# Patient Record
Sex: Male | Born: 1955 | Race: White | Hispanic: No | Smoking: Current every day smoker
Health system: Southern US, Community
[De-identification: ages and names within clinical notes are randomized; demographics above are authoritative.]

## PROBLEM LIST (undated history)

## (undated) DIAGNOSIS — C801 Malignant (primary) neoplasm, unspecified: Secondary | ICD-10-CM

## (undated) DIAGNOSIS — I779 Disorder of arteries and arterioles, unspecified: Secondary | ICD-10-CM

## (undated) DIAGNOSIS — J449 Chronic obstructive pulmonary disease, unspecified: Secondary | ICD-10-CM

## (undated) DIAGNOSIS — C169 Malignant neoplasm of stomach, unspecified: Secondary | ICD-10-CM

## (undated) DIAGNOSIS — I639 Cerebral infarction, unspecified: Secondary | ICD-10-CM

## (undated) DIAGNOSIS — I739 Peripheral vascular disease, unspecified: Secondary | ICD-10-CM

## (undated) DIAGNOSIS — Z66 Do not resuscitate: Secondary | ICD-10-CM

## (undated) DIAGNOSIS — I10 Essential (primary) hypertension: Secondary | ICD-10-CM

## (undated) DIAGNOSIS — C3490 Malignant neoplasm of unspecified part of unspecified bronchus or lung: Secondary | ICD-10-CM

## (undated) HISTORY — DX: Do not resuscitate: Z66

## (undated) HISTORY — DX: Malignant neoplasm of unspecified part of unspecified bronchus or lung: C34.90

---

## 2001-01-25 ENCOUNTER — Inpatient Hospital Stay (HOSPITAL_COMMUNITY): Admission: EM | Admit: 2001-01-25 | Discharge: 2001-01-30 | Payer: Self-pay | Admitting: Emergency Medicine

## 2001-01-25 ENCOUNTER — Encounter: Payer: Self-pay | Admitting: Family Medicine

## 2010-03-09 ENCOUNTER — Encounter: Payer: Self-pay | Admitting: Internal Medicine

## 2010-07-03 NOTE — Consult Note (Signed)
[facility]  Patient:    Edward Duran, Edward Duran Visit Number: [id number] MRN: [id number]          Service Type: MED Location: ICCU IC06 01 Attending Physician:  Annamarie Dawley. Dictated by:   Beryle Beams, M.D. 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  RECOMMENDATIONS: 1. Fosphenytoin loading dose 20 mg/kg PE over one hour. 2. Continue with current fosphenytoin 100 mg every 8 hours.  We will check    a level in the a.m. to keep the dose in the high therapeutic range. 3. Follow-up EEG. 4. Patient is febrile, likely complication of aspiration.  However, if he    remains febrile we will consider a spinal tap.  HISTORY:  This is a 55 year old Caucasian gentleman who apparently sustained a stroke about two years ago.  It is not clear if this was an ischemic or hemorrhagic stroke.  He was seen at [facility], however.  Apparently he had been drinking prior to this stroke for about a year.  He apparently was at home today when he attempted to call his mother.  He was noted to have difficulty with expression of language and speech.  On arriving there the family noted he was having generalized twitches and stiffening.  He did come to, but then went on to have another one of these spells.  EMS was activated on arrival.  EMS noted that he was twitching on the right side, subsequently became unconscious.  He had another event again.  He was subsequently taken to the hospital where he was intubated, given Ativan a total of 6 mg.  Also was given thiamine and folic acid.  He is currently on diprivan drip and had to be intubated.  There have been no reported clinical seizures.  PAST MEDICAL HISTORY:  As stated above.  There is also a history of hypertension and alcoholism.  MEDICATIONS:  None.  SOCIAL HISTORY:  He is divorced.  Currently lives by  himself.  REVIEW OF SYSTEMS:  Unobtainable.  PHYSICAL EXAMINATION  GENERAL:  Thin gentleman who is intubated.  He is on a Propofol drip.  He does not open his eyes to deep pain.  NEUROLOGIC:  Pupils are equal and reactive.  He does have oculocephalic reflexes.  Corneal reflexes are absent bilaterally.  He flexes and withdraws modestly to deep painful stimuli bilaterally.  Reflexes are diminished throughout.  Toes are both upgoing.  LABORATORIES:  CT scan of the brain shows an old left occipital infarct almost in a watershed distribution.  There is also some encephalomalacia involving the left temporal region.  Sodium 134, potassium 3.7, chloride 103, CO2 19, glucose 261, BUN 6, creatinine 1.2, calcium 8.6.  CK 55, MB 2.6, troponin 0.04.  Alcohol level 5, normal 0-10.  Acetaminophen is normal.  Urinalysis shows greater than 3300 protein, ketones negative, leukocyte esterase negative, wbcs 7-10, rbcs 11-20, bacteria many.  Urine drug screen has been negative.  Salicylates: None detected.  WBC 12, hemoglobin 14, platelets 321,000.  It appears that another glucose was repeated and the level was 670.  INR 2.3, protime 18.8, PTT 43.  Alkaline phosphatase 144. Dictated by:   Beryle Beams, M.D. Attending Physician:  Annamarie Dawley DD:  01/25/01 TD:  01/26/01 Job: [id number]

## 2010-07-03 NOTE — Group Therapy Note (Signed)
[facility]  Patient:    MENASHE, KAFER Visit Number: [id number] MRN: [id number]          Service Type: MED Location: ICCU IC06 01 Attending Physician:  Annamarie Dawley. Dictated by:   Kari Baars, M.D. Admit Date:  01/25/2001                               Progress Note  PROBLEM:  Respiratory failure.  SUBJECTIVE:  Mr. Brutus is much more alert this morning.  He is awake.  Seems to be fighting to try to get the endotracheal tube out.  His arterial blood gases showed that his pH was about 7.4, PCO2 in the 30s, PO2 in the 90s.  His chest is very clear and he is more alert.  ASSESSMENT:  He does seem to be improving, albeit it is fairly slowly.  PLAN:  Extubate him today and see if we can get him back on regular regimen of treatment.  It is not clear how much of his problem is going to be a permanent effect. Dictated by:   Kari Baars, M.D. Attending Physician:  Annamarie Dawley DD:  01/27/01 TD:  01/27/01 Job: [id number]

## 2010-07-03 NOTE — Group Therapy Note (Signed)
[facility]  Patient:    Edward Duran, Edward Duran Visit Number: [id number] MRN: [id number]          Service Type: MED Location: ICCU IC06 01 Attending Physician:  Annamarie Dawley. Dictated by:   Kari Baars, M.D. Admit Date:  01/25/2001                               Progress Note  SUBJECTIVE:  Edward Duran is much better this morning.  He is awake, alert, and things seem to be going okay.  He has no complaints.  He is confused about exactly what happened to him which is not surprising with the seizures.  He has been extubated.  He is tolerating that extremely well.  PHYSICAL EXAMINATION  CHEST:  Clear.  VITAL SIGNS:  His O2 saturations are running in the mid 90s.  ASSESSMENT:  He is much improved.  PLAN:  I am going to sign off.  I will be glad to see him again, of course, if needed.  Thanks again for allowing me to see him with you. Dictated by:   Kari Baars, M.D. Attending Physician:  Annamarie Dawley DD:  01/29/01 TD:  01/29/01 Job: [id number]

## 2010-07-03 NOTE — Discharge Summary (Signed)
[facility]  Patient:    Edward Duran, Edward Duran Visit Number: [id number] MRN: [id number]          Service Type: MED Location: 2A A215 01 Attending Physician:  Syliva Overman Dictated by:   Syliva Overman, M.D. Admit Date:  01/25/2001 Discharge Date: 01/30/2001                             Discharge Summary  DISCHARGE DIAGNOSES: 1. 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 2. Alcohol dependence with cirrhosis. 3. History of ascites. 4. History of hypertension. 5. 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 6. 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  HISTORY OF PRESENT ILLNESS:  The patient is a 55 year old male who was found unresponsive by his mother on the day of his admission.  The patient has a past history of alcohol use.  The mother stated she was uncertain when was the last time he had a drink.  The patient reports he had not had a drink in the past six months.  When his mother found him, she found him sitting in a chair unable to speak, otherwise, he did not seem to be in distress initially. However, as time progressed, he had progressive difficulty breathing, and 911 was called.  He was found to be in severe respiratory distress and unresponsive.  Intubation was attempted in the field unsuccessfully.  On arrival to the emergency room, the patient was immediately intubated.  At no time did he have any asystole.  While in the emergency department, the patient was noted to have a generalized seizure accompanied by urinary incontinence.  At that time, he was loaded with IV Cerebryx he also required fluid resuscitation.  The patient was also given Narcan and an ampule of D-50 in the emergency room in an attempt to assist in the recovery of his consciousness with no success.  PAST MEDICAL HISTORY: 1. Left middle cerebral artery stroke. 2. Chronic  alcohol use with history of cirrhosis. 3. History of hypertension.  PAST SURGICAL HISTORY:  Cystolithopexy.  ALLERGIES:  No known drug allergies.  SOCIAL HISTORY:  The patient is divorced.  He is alcohol dependent.  He smokes two packs of cigarettes a day.  He is a father of one son.  He is disabled.  ADMISSION PHYSICAL EXAMINATION:  GENERAL:  In the intensive care unit, he was sedated and intubated.  However, he would respond to noxious stimuli.  VITAL SIGNS:  Temperature was 101 and pulse 107.  He was on mechanical ventilation at a rate of 14 with a blood pressure of 145/89.  HEENT:  Negative for facial asymmetry.  Pupils are equal, round and reactive to light.  Oral exam revealed poor dentition.  His mucosa was moist and pink.  NECK:  He had no JVD.  He did have a bruit on the left side.  CARDIOVASCULAR:  Heart sounds one and two heard.  No murmur or S3 was heard.  ABDOMEN:  Soft and nontender.  No palpable organomegaly or masses.  Bowel sounds present and normal.  RECTAL:  Not done.  EXTREMITIES:  Negative for edema, ulcers or bruising.  Pulses are 4/2 in the left leg.  NEUROLOGIC:  Since the patient was sedated, the only observation was that the was responding to noxious stimulus.  DIAGNOSTIC STUDIES:  Sodium was 134, potassium 3.7, chloride 104, CO2  19, glucose 261, BUN 6, creatinine 1.2, calcium 8.6.  Initial CK/MB was 2.6. Creatinine kinase 55, troponin-I 0.04.  Alcohol level 5.  Tylenol 0.4. Urinalysis:  Clear with a specific gravity of 1.020, moderate amount of blood likely secondary to trauma, negative for leukocytes and nitrites. Drug screen was negative.  Salicylate level was sent for.  His white cell count was 21.4, hemoglobin 14.9, platelets 321,000.  Hepatic panel showed an elevated alkaline phosphatase at 144.  INR was 2.3.  His blood gases on 50% oxygen showed a pH of 7.412, CO2 52.6, PO2 of 210, with saturations of 99.7.  HOSPITAL COURSE: #1 -  PULMONARY SYSTEM:  The patient was maintained on mechanical ventilation until his neurologic state improved.  When it was felt safe to extubate him. He did not require much ventilatory support.  His management was by Dr. Juanetta Gosling.  He was extubated on January 27, 2001 successfully with no decompensation in his respiratory status.  On admission, he was placed on antibiotic coverage for prophylaxis against possible aspiration pneumonia.  His blood cultures after five days was negative.  His chest x-ray did not show any evidence of a pneumonia, and his discharge white cell count is normal at 8.  He will not be discharged on any antibiotics.  #2 - NEUROLOGIC SYSTEM:  Beryle Beams, M.D. of neurology was consulted on his admission.  He has had an EEG done the results of which are still pending.  At the time of his discharge, he is therapeutic with a Dilantin level of 23.9. He has had no seizures while in the hospital.  His neurologic exam on the day of discharge is negative for any focal signs.  A repeat CT scan done on admission showed no evidence of any new infarct.  He has grade 5 color with normal tone and reflexes in all extremities.  #3 - CARDIOVASCULAR SYSTEM:  The patient had a bilateral Doppler studies done which showed stenosis of his left internal carotid artery 95%.  He will be be discharged now with greater than 95% stenosis of the left ICA and PCA. He has nonstenotic atherosclerotic plaque of the right carotid bifurcation. Both vertebral arteries are patent.  He will be referred to vascular surgery for evaluation as an  outpatient, and he will be discharged home on an aspirin daily.  NOTE:  The patients GI and urologic systems were stable throughout.  DISCHARGE LABORATORY STUDIES:  Dilantin level 23.5.  Blood culture times five days negative.  Hemoglobin 12.6.  White cell count 8 and platelets 205. Chemistry showed a sodium of 144, potassium 3.5, chloride 105, CO2 31, BUN  2, creatinine 0.9, and glucose 140.   DISPOSITION:  The patient is discharged in stable condition.  He is being referred to case management for assistance with medication as an outpatient as well as eligibility for state aid for health care.  He will follow up with a primary physician of his choice which is Dr. Juanetta Gosling who he had seen up until one year ago.  DISCHARGE MEDICATIONS: 1. Dilantin 100 mg two at bedtime. 2. Aspirin 325 mg daily.  NOTE:  He has been advised to drink no alcohol. Dictated by:   Syliva Overman, M.D. Attending Physician:  Syliva Overman DD:  01/30/01 TD:  01/30/01 Job: [id number]

## 2010-07-03 NOTE — H&P (Signed)
[facility]  Patient:    Edward Duran, Edward Duran Visit Number: [id number] MRN: [id number]          Service Type: MED Location: ICCU IC06 01 Attending Physician:  Annamarie Dawley. Dictated by:   Syliva Overman, M.D. Admit Date:  01/25/2001                           History and Physical  CHIEF COMPLAINT:  In summary, Mr. Senner is a 55 year old white male, who was found unresponsive by his mother on the day of his admission.  HISTORY OF PRESENT ILLNESS:  The mother reported that the patient called for help and within five minutes when she went to the home she found him sitting on a chair unable to speak, and otherwise in no seeming distress.  The mother denied witnessing any seizure activity.  She called 911 and per EMS report when they arrived on the scene the patient was unresponsive and appeared to have labored breathing.  There was also report of seizure-like activity witnessed in the field.  Intubation was attempted in the field unsuccessfully, and on arrival to the emergency room a code was called and the patient was intubated.  While in the emergency department the patient had a generalized seizure as well as urinary incontinence.  At that time he received IV Cerebyx 100 mg loading dose and he required fluid resuscitation, receiving IV fluids ______ wide open.  He was also given Narcan 2 mg IV, an ampule of D50 IV on arrival with no recovery in the patients mentation.  PAST MEDICAL HISTORY:  Positive for CVA approximately two years ago.  The family reports he was hospitalized initially at [facility], then transferred to [facility] where he remained for approximately one months.  The cause of the seizure is not known.  Of significance is that when he first arrived in the emergency room his blood pressure was recorded as being 224/104 with a heart rate of 153.  PAST SURGICAL HISTORY:  None.  ALLERGIES:  None known.  SOCIAL HISTORY:   There is a positive history of alcohol abuse.  He smokes two packs of cigarettes daily.  The mother denies any illicit drug use.  The patient has been divorced for two years and has become increasingly alcohol dependent since that time.  He lives with another gentleman.  He has one grown son.  He is disabled.  PHYSICAL EXAMINATION:  GENERAL:  At the time when I saw him the patient was in the intensive care unit.  He was sedated and intubated.  He would respond to noxious stimuli.  VITAL SIGNS:  When I examined him in the intensive care unit temperature was 101 degrees, pulse 107.  He was being ventilated at a rate of 14.  Blood pressure 145/89.  HEENT:  There was no facial asymmetry.  Pupils reacted to light equally.  Oral examination revealed poor dentition.  Adequate moist, pink mucosa.  NECK:  No JVD.  No carotid bruits heard.  CHEST:  Adequate air entry throughout.  No crackles or wheezes heard.  CARDIOVASCULAR:  Heart sounds 1 and 2 heard.  No murmur, no S3.  ABDOMEN:  Soft, nontender.  No palpable organomegaly or masses.  Bowel sounds present and normal.  EXTREMITIES:  Negative for edema, ulcers, or bruising.  He did have a tattoo on his left leg.  RECTAL:  Not done.  NEUROLOGIC:  Not done secondary to the  patient being sedated to accommodate intubation.  However, he was seen to have spontaneous movement of the right lower extremity and he did respond to noxious stimuli.  LABORATORY DATA:  Admission sodium 134, potassium 3.7, chloride 104, CO2 19, glucose 261, BUN 6, creatinine 1.2.  Calcium 8.6.  Initial CK-MB was 2.6. Creatinine kinase 55.  Troponin I 0.04.  Alcohol level was 5.  Tylenol level 0.4.  Urinalysis was cloudy with specific gravity 1.020, moderate amount of blood likely secondary to trauma, protein 300; no leukocytes, nitrite negative.  Drug screen was negative.  Salicylate level less than 4.  WBC 21.4, hemoglobin 14.9, platelet count 321,000.  Hepatic  panel only showed an elevated alkaline phosphatase at 144.  INR was 2.3.  Blood gas on 50% oxygen showed pH 7.412, pCO2 32.6, pO2 210, saturation 99.7%.  ASSESSMENT:  This patient is a 55 year old white male with a history of cerebrovascular accident two years ago and a history of alcohol dependence, who presents with altered mental status, negative drug screen, and new seizure activity.  The patients head CT scan is reported as negative for any acute intracranial hemorrhage.  PLAN:  He has received a loading dose of Cerebyx 100 mg in the ER, and is being placed on 100 mg IV q.8h, with neurologic checks q.2h.  He will be maintained on mechanical ventilation for airway protection as long as is needed.  He is placed on double antibiotic coverage secondary to the possibility of an aspiration pneumonia and also in light of the fact that he has leukocytosis which may be well related only to his seizure activity. Neurology is consulted for assistance in his management and MRI of his brain is ordered with contrast to be done when the patient is stable.  An EEG is also requested.  Dr. Juanetta Gosling of pulmonary medicine is consulted for assistance with ventilator management.Dictated by:   Syliva Overman, M.D. Attending Physician:  Annamarie Dawley DD:  01/25/01 TD:  01/26/01 Job: [id number]

## 2010-07-03 NOTE — Group Therapy Note (Signed)
[facility]  Patient:    RASHAUD, YBARBO Visit Number: [id number] MRN: [id number]          Service Type: MED Location: ICCU IC06 01 Attending Physician:  Annamarie Dawley. Dictated by:   Kari Baars, M.D. Admit Date:  01/25/2001                               Progress Note  PROBLEM:  Respiratory failure secondary to status epilepticus, possible stroke.  SUBJECTIVE:  Mr. Mckillop is not responsive but he has been receiving diprivan. He has also received intravenous seizure medications.  PHYSICAL EXAMINATION  GENERAL:  He is unresponsive.  CHEST:  Actually relatively clear.  HEART:  Regular.  LABORATORIES:  Arterial blood gas shows his PO2 is well over 100 and he is hyperventilated.  ASSESSMENT:  He has hyperventilation which at times is helpful with cerebral edema but that is unlikely to be helpful in this case.  I will therefore reduce his ventilator.  He is not breathing at all on his own at this point. We will go ahead and reduce his rate and also reduce his oxygen slightly.  He is only on 40% so he has little chance of oxygen toxicity. Dictated by:   Kari Baars, M.D. Attending Physician:  Annamarie Dawley DD:  01/26/01 TD:  01/26/01 Job: [id number]

## 2012-12-28 ENCOUNTER — Ambulatory Visit (HOSPITAL_COMMUNITY)
Admission: RE | Admit: 2012-12-28 | Discharge: 2012-12-28 | Disposition: A | Discharge status: Home / Self Care | Payer: Self-pay | Source: Ambulatory Visit | Attending: Family Medicine | Admitting: Family Medicine

## 2012-12-28 ENCOUNTER — Other Ambulatory Visit (HOSPITAL_COMMUNITY): Payer: Self-pay | Admitting: Family Medicine

## 2012-12-28 DIAGNOSIS — M545 Low back pain, unspecified: Secondary | ICD-10-CM | POA: Insufficient documentation

## 2012-12-28 DIAGNOSIS — M47817 Spondylosis without myelopathy or radiculopathy, lumbosacral region: Secondary | ICD-10-CM | POA: Insufficient documentation

## 2012-12-28 DIAGNOSIS — I714 Abdominal aortic aneurysm, without rupture, unspecified: Secondary | ICD-10-CM | POA: Insufficient documentation

## 2012-12-28 DIAGNOSIS — M899 Disorder of bone, unspecified: Secondary | ICD-10-CM | POA: Insufficient documentation

## 2012-12-28 DIAGNOSIS — M4120 Other idiopathic scoliosis, site unspecified: Secondary | ICD-10-CM | POA: Insufficient documentation

## 2014-09-10 ENCOUNTER — Emergency Department (HOSPITAL_COMMUNITY): Payer: Self-pay

## 2014-09-10 ENCOUNTER — Emergency Department (HOSPITAL_COMMUNITY)
Admission: EM | Admit: 2014-09-10 | Discharge: 2014-09-10 | Disposition: A | Discharge status: Home / Self Care | Payer: Self-pay | Attending: Emergency Medicine | Admitting: Emergency Medicine

## 2014-09-10 ENCOUNTER — Encounter (HOSPITAL_COMMUNITY): Payer: Self-pay

## 2014-09-10 DIAGNOSIS — Z85828 Personal history of other malignant neoplasm of skin: Secondary | ICD-10-CM | POA: Insufficient documentation

## 2014-09-10 DIAGNOSIS — I714 Abdominal aortic aneurysm, without rupture, unspecified: Secondary | ICD-10-CM

## 2014-09-10 DIAGNOSIS — M5416 Radiculopathy, lumbar region: Secondary | ICD-10-CM | POA: Insufficient documentation

## 2014-09-10 DIAGNOSIS — M4186 Other forms of scoliosis, lumbar region: Secondary | ICD-10-CM | POA: Insufficient documentation

## 2014-09-10 DIAGNOSIS — Z8673 Personal history of transient ischemic attack (TIA), and cerebral infarction without residual deficits: Secondary | ICD-10-CM | POA: Insufficient documentation

## 2014-09-10 DIAGNOSIS — M419 Scoliosis, unspecified: Secondary | ICD-10-CM

## 2014-09-10 DIAGNOSIS — I10 Essential (primary) hypertension: Secondary | ICD-10-CM | POA: Insufficient documentation

## 2014-09-10 DIAGNOSIS — M25559 Pain in unspecified hip: Secondary | ICD-10-CM

## 2014-09-10 DIAGNOSIS — Z720 Tobacco use: Secondary | ICD-10-CM | POA: Insufficient documentation

## 2014-09-10 DIAGNOSIS — J449 Chronic obstructive pulmonary disease, unspecified: Secondary | ICD-10-CM | POA: Insufficient documentation

## 2014-09-10 HISTORY — DX: Peripheral vascular disease, unspecified: I73.9

## 2014-09-10 HISTORY — DX: Disorder of arteries and arterioles, unspecified: I77.9

## 2014-09-10 HISTORY — DX: Chronic obstructive pulmonary disease, unspecified: J44.9

## 2014-09-10 HISTORY — DX: Cerebral infarction, unspecified: I63.9

## 2014-09-10 HISTORY — DX: Malignant (primary) neoplasm, unspecified: C80.1

## 2014-09-10 HISTORY — DX: Essential (primary) hypertension: I10

## 2014-09-10 LAB — BASIC METABOLIC PANEL
ANION GAP: 9 (ref 5–15)
BUN: 11 mg/dL (ref 6–20)
CALCIUM: 8.8 mg/dL — AB (ref 8.9–10.3)
CO2: 25 mmol/L (ref 22–32)
CREATININE: 1.19 mg/dL (ref 0.61–1.24)
Chloride: 104 mmol/L (ref 101–111)
GFR calc Af Amer: >60 mL/min (ref >60)
GLUCOSE: 104 mg/dL — AB (ref 65–99)
Potassium: 3 mmol/L — ABNORMAL LOW (ref 3.5–5.1)
SODIUM: 138 mmol/L (ref 135–145)

## 2014-09-10 MED ORDER — HYDROCODONE-ACETAMINOPHEN 5-325 MG PO TABS: 1.0000 | ORAL_TABLET | ORAL | Status: DC | PRN

## 2014-09-10 MED ORDER — OXYCODONE-ACETAMINOPHEN 5-325 MG PO TABS
1.0000 | ORAL_TABLET | Freq: Once | ORAL | Status: AC
  Administered 2014-09-10: 1 via ORAL
  Filled 2014-09-10: qty 1

## 2014-09-10 MED ORDER — POTASSIUM CHLORIDE 20 MEQ PO PACK
40.0000 meq | PACK | Freq: Once | ORAL | Status: AC
  Administered 2014-09-10: 40 meq via ORAL
  Filled 2014-09-10: qty 2

## 2014-09-10 MED ORDER — LABETALOL HCL 5 MG/ML IV SOLN
10.0000 mg | Freq: Once | INTRAVENOUS | Status: AC
Start: 2014-09-10 — End: 2014-09-10
  Administered 2014-09-10: 10 mg via INTRAVENOUS
  Filled 2014-09-10: qty 4

## 2014-09-10 NOTE — ED Notes (Signed)
Awaiting xray disc.

## 2014-09-10 NOTE — ED Notes (Signed)
Pt reports fell stepping off of a step last weekend and c/o pain in r hip.  Reports has fallen 3 more times since then because of hip pain.  Pt ambulatory with cane at this time.

## 2014-09-10 NOTE — ED Provider Notes (Signed)
CSN: [id number]     Arrival date & time 09/10/14  1029 History   First MD Initiated Contact with Patient 09/10/14 1044     Chief Complaint  Patient presents with  . Hip Pain     (Consider location/radiation/quality/duration/timing/severity/associated sxs/prior Treatment) The history is provided by the patient and the spouse.   Edward Duran is a 59 y.o. 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  He describes falling on his right side when he tripped coming off an elevator at [employer] approximately one month ago.  Since then he has had 3 additional falls, most recently occurring approximately 3 days ago.  He describes pain in the right lateral hip which is aching and radiates into his lower posterior thigh and is worsened with movement, certain positions and weight bearing.  He has taken no medicines recently for pain relief, but did have hydrocodone for his recent surgical recovery which helped his hip pain greatly.  He and family endorses that since his recent referral to [facility], he has also been diagnosed with blockages in his carotid arteries and is anticipating consultation with a vascular surgeon there in 3 months. He has run out of his bp medication, took his last dose of his medication yesterday.  He will be picking up his new prescription from the health department today.    Past Medical History  Diagnosis Date  . COPD (chronic obstructive pulmonary disease)   . Hypertension   . Stroke   . Cancer   . Carotid artery disease    History reviewed. No pertinent past surgical history. No family history on file. History  Substance Use Topics  . Smoking status: Current Every Day Smoker  . Smokeless tobacco: Not on file  . Alcohol Use: No    Review of Systems   Constitutional: Negative for fever.  Eyes: Negative for visual disturbance.  Respiratory: Negative for shortness of breath.   Cardiovascular: Negative for chest pain and leg swelling.  Gastrointestinal: Negative for nausea, vomiting, abdominal pain, constipation and abdominal distention.  Genitourinary: Negative for dysuria, urgency, frequency, flank pain and difficulty urinating.  Musculoskeletal: Positive for back pain and arthralgias. Negative for joint swelling and gait problem.  Skin: Negative for rash.  Neurological: Negative for weakness, numbness and headaches.      Allergies  Review of patient's allergies indicates no known allergies.  Home Medications   Prior to Admission medications   Medication Sig Start Date End Date Taking? Authorizing Provider  HYDROcodone-acetaminophen (NORCO/VICODIN) 5-325 MG per tablet Take 1 tablet by mouth every 4 (four) hours as needed. 09/10/14   Evalee Jefferson, PA-C   BP 210/69 mmHg  Pulse 64  Temp(Src) 97.7 F (36.5 C) (Oral)  Resp 20  Ht '5\' 6"'$  (1.676 m)  Wt 130 lb (58.968 kg)  BMI 20.99 kg/m2  SpO2 95% Physical Exam  Constitutional: He appears well-developed and well-nourished.  HENT:  Head: Normocephalic.  Eyes: Conjunctivae are normal.  Neck: Normal range of motion. Neck supple.  Cardiovascular: Normal rate and intact distal pulses.   Pulses:      Dorsalis pedis pulses are 2+ on the right side, and 2+ on the left side.  Pedal pulses normal.  Pulmonary/Chest: Effort normal.  Abdominal: Soft. Bowel sounds are normal. He exhibits no distension and no mass.  Musculoskeletal: Normal range of motion. He exhibits no edema.       Lumbar back: He exhibits tenderness. He exhibits no bony tenderness, no swelling, no edema and no spasm.  ttp right posterior buttock and right lateral hip. No pain with flex/ext or internal and external ROM of the hip.    Neurological: He is alert. He has normal strength. He displays no atrophy and no tremor. No  sensory deficit. Gait normal.  Reflex Scores:      Patellar reflexes are 2+ on the right side and 2+ on the left side.      Achilles reflexes are 2+ on the right side and 2+ on the left side. No strength deficit noted in hip and knee flexor and extensor muscle groups.  Ankle flexion and extension intact.  Skin: Skin is warm and dry.  Psychiatric: He has a normal mood and affect.  Nursing note and vitals reviewed.   ED Course  Procedures (including critical care time) Labs Review Labs Reviewed  BASIC METABOLIC PANEL - Abnormal; Notable for the following:    Potassium 3.0 (*)    Glucose, Bld 104 (*)    Calcium 8.8 (*)    All other components within normal limits    Imaging Review Dg Chest 2 View  09/10/2014   CLINICAL DATA:  Six week history of productive cough.  EXAM: CHEST  2 VIEW  COMPARISON:  None.  FINDINGS: There is scarring with volume loss in the right middle lobe region. There is no edema or consolidation. The heart size and pulmonary vascularity are normal. No adenopathy. No bone lesions. There is atherosclerotic change in the aorta.  IMPRESSION: Scarring with volume loss right middle lobe. No edema or consolidation. As there are no prior examinations for comparison, a followup study in 6 to 8 weeks to assess for stability of the volume loss in the right middle lobe would be advised.   Electronically Signed   By: Lowella Grip III M.D.   On: 09/10/2014 11:22   Dg Lumbar Spine Complete  09/10/2014   CLINICAL DATA:  Lumbago with right-sided radicular symptoms  EXAM: LUMBAR SPINE - COMPLETE 4+ VIEW  COMPARISON:  August 27, 2012  FINDINGS: Frontal, lateral, spot lumbosacral lateral, and bilateral oblique views were obtained. There are 5 non-rib-bearing lumbar type vertebral bodies. T12 ribs are hypoplastic. There is increase in lumbar levorotoscoliosis. There is no fracture or spondylolisthesis. There is mild disc space narrowing at L1-2, L2-3, L3-4, and L4-5. There is facet  osteoarthritic change at L5-S1 bilaterally. There is atherosclerotic change in the aorta.  IMPRESSION: Levoscoliosis, increased from prior study. Disc space narrowing at multiple levels. No fracture or spondylolisthesis.  There is atherosclerotic change in the aorta. There is slight widening of the distal abdominal aorta, similar in appearance to the previous study. Given concern for potential mild distal aneurysm in the aorta, correlation with aortic ultrasound advised.   Electronically Signed   By: Lowella Grip III M.D.   On: 09/10/2014 11:25   Dg Hip Unilat With Pelvis 2-3 Views Right  09/10/2014   CLINICAL DATA:  Productive cough, right leg  EXAM: DG HIP (WITH OR WITHOUT PELVIS) 2-3V RIGHT  COMPARISON:  None.  FINDINGS: There is no evidence of hip fracture or dislocation. There is no evidence of arthropathy or other focal bone abnormality. There is peripheral vascular atherosclerotic disease.  IMPRESSION: No acute osseous injury of the right hip.   Electronically Signed   By: Kathreen Devoid  On: 09/10/2014 11:21     EKG Interpretation None      MDM   Final diagnoses:  Abdominal aortic aneurysm  Scoliosis  Lumbar radiculopathy  Essential hypertension    Patients labs and/or radiological studies were reviewed and considered during the medical decision making and disposition process.  Results were also discussed with patient.  Pt is ambulatory using cane.  Discussed findings with pt and ex wife at bedside. Advised of stable abdominal aneurysm which he states he knew nothing about, although previous LS film obtained in 2014 indicated this condition.  Per [facility] Chart he is scheduled with vascular surgery in October re his carotid artery disease.  He has been scheduled an aneurysm survey in anticipation of this appt.  Also discussed need for repeat chest xray in 8 weeks - advised have pcp follow this up.  He was given labetalol while here to lower bp, no sign of end organ damage today,  creatinine normal range, denies cp, denies headache, vision change.  Advised to get bp medication picked today (waiting for him at the health dept - cannot recall name).     Evalee Jefferson, PA-C 09/11/14 6010  Milton Ferguson, MD 09/11/14 0800

## 2014-09-10 NOTE — Discharge Instructions (Signed)
Lumbosacral Radiculopathy Lumbosacral radiculopathy is a pinched nerve or nerves in the low back (lumbosacral area). When this happens you may have weakness in your legs and may not be able to stand on your toes. You may have pain going down into your legs. There may be difficulties with walking normally. There are many causes of this problem. Sometimes this may happen from an injury, or simply from arthritis or boney problems. It may also be caused by other illnesses such as diabetes. If there is no improvement after treatment, further studies may be done to find the exact cause. DIAGNOSIS  X-rays may be needed if the problems become long standing. Electromyograms may be done. This study is one in which the working of nerves and muscles is studied. HOME CARE INSTRUCTIONS   Applications of ice packs may be helpful. Ice can be used in a plastic bag with a towel around it to prevent frostbite to skin. This may be used every 2 hours for 20 to 30 minutes, or as needed, while awake, or as directed by your caregiver.  Only take over-the-counter or prescription medicines for pain, discomfort, or fever as directed by your caregiver.  If physical therapy was prescribed, follow your caregiver's directions. SEEK IMMEDIATE MEDICAL CARE IF:   You have pain not controlled with medications.  You seem to be getting worse rather than better.  You develop increasing weakness in your legs.  You develop loss of bowel or bladder control.  You have difficulty with walking or balance, or develop clumsiness in the use of your legs.  You have a fever. MAKE SURE YOU:   Understand these instructions.  Will watch your condition.  Will get help right away if you are not doing well or get worse. Document Released: 02/01/2005 Document Revised: 04/26/2011 Document Reviewed: 09/22/2007 [facility] Patient Information 2015 [place], [place]. This information is not intended to replace advice given to you by your health  care provider. Make sure you discuss any questions you have with your health care provider.  Abdominal Aortic Aneurysm  Blood pumps away from the heart through tubes (blood vessels) called arteries. Aneurysms are weak or damaged places in the wall of an artery. It bulges out like a balloon. An abdominal aortic aneurysm happens in the main artery of the body (aorta). It can burst or tear, causing bleeding inside the body. This is an emergency. It needs treatment right away. CAUSES  The exact cause is unknown. Things that could cause this problem include:  Fat and other substances building up in the lining of a tube.  Swelling of the walls of a blood vessel.  Certain tissue diseases.  Belly (abdominal) trauma.  An infection in the main artery of the body. RISK FACTORS There are things that make it more likely for you to have an aneurysm. These include:  Being over the age of 60 years old.  Having high blood pressure (hypertension).  Being a male.  Being white.  Being very overweight (obese).  Having a family history of aneurysm.  Using tobacco products. PREVENTION To lessen your chance of getting this condition:  Stop smoking. Stop chewing tobacco.  Limit or avoid alcohol.  Keep your blood pressure, blood sugar, and cholesterol within normal limits.  Eat less salt.  Eat foods low in saturated fats and cholesterol. These are found in animal and whole dairy products.  Eat more fiber. Fiber is found in whole grains, vegetables, and fruits.  Keep a healthy weight.  Stay active and  exercise often. SYMPTOMS Symptoms depend on the size of the aneurysm and how fast it grows. There may not be symptoms. If symptoms occur, they can include:  Pain (belly, side, lower back, or groin).  Feeling full after eating a small amount of food.  Feeling sick to your stomach (nauseous), throwing up (vomiting), or both.  Feeling a lump in your belly that feels like it is beating  (pulsating).  Feeling like you will pass out (faint). TREATMENT   Medicine to control blood pressure and pain.  Imaging tests to see if the aneurysm gets bigger.  Surgery. MAKE SURE YOU:   Understand these instructions.  Will watch your condition.  Will get help right away if you are not doing well or get worse. Document Released: 05/29/2012 Document Reviewed: 05/29/2012 [facility] Patient Information 2015 [place]. This information is not intended to replace advice given to you by your health care provider. Make sure you discuss any questions you have with your health care provider.   Contact your primary doctor as discussed for assistance getting a referral to a closer vascular surgeon if you are having difficulty getting to your appointments at [facility].  Also as discussed you should have a repeat chest x-ray in 8 weeks to confirm that the findings on today's x-ray are stable scarring only.  You also need an ultrasound of your abdomen which can be done at your convenience, but as soon as is scheduleable by your doctor to get better information about your abdominal aneurysm.  Please have your doctor at the health department schedule this.  You may take the hydrocodone prescribed for pain relief.  This will make you drowsy - do not drive within 4 hours of taking this medication.  Get your blood pressure medicine picked up from the health department.  It is important for you to not miss any doses to help keep your blood pressure under better control.

## 2014-09-11 ENCOUNTER — Encounter (HOSPITAL_COMMUNITY): Payer: Self-pay | Admitting: Emergency Medicine

## 2014-09-11 DIAGNOSIS — I779 Disorder of arteries and arterioles, unspecified: Secondary | ICD-10-CM | POA: Insufficient documentation

## 2014-09-11 DIAGNOSIS — I739 Peripheral vascular disease, unspecified: Secondary | ICD-10-CM

## 2014-10-21 ENCOUNTER — Inpatient Hospital Stay (HOSPITAL_COMMUNITY)
Admission: EM | Admit: 2014-10-21 | Discharge: 2014-10-28 | DRG: 453 | Disposition: A | Discharge status: Rehabilitation Facility | Payer: Medicaid Other | Attending: Internal Medicine | Admitting: Internal Medicine

## 2014-10-21 ENCOUNTER — Encounter (HOSPITAL_COMMUNITY): Payer: Self-pay | Admitting: Emergency Medicine

## 2014-10-21 ENCOUNTER — Emergency Department (HOSPITAL_COMMUNITY): Payer: Medicaid Other

## 2014-10-21 ENCOUNTER — Inpatient Hospital Stay (HOSPITAL_COMMUNITY): Payer: Medicaid Other

## 2014-10-21 DIAGNOSIS — M545 Low back pain, unspecified: Secondary | ICD-10-CM

## 2014-10-21 DIAGNOSIS — R292 Abnormal reflex: Secondary | ICD-10-CM

## 2014-10-21 DIAGNOSIS — M8440XA Pathological fracture, unspecified site, initial encounter for fracture: Secondary | ICD-10-CM | POA: Diagnosis present

## 2014-10-21 DIAGNOSIS — E785 Hyperlipidemia, unspecified: Secondary | ICD-10-CM | POA: Diagnosis present

## 2014-10-21 DIAGNOSIS — E46 Unspecified protein-calorie malnutrition: Secondary | ICD-10-CM | POA: Diagnosis present

## 2014-10-21 DIAGNOSIS — R578 Other shock: Secondary | ICD-10-CM | POA: Diagnosis not present

## 2014-10-21 DIAGNOSIS — R739 Hyperglycemia, unspecified: Secondary | ICD-10-CM | POA: Diagnosis present

## 2014-10-21 DIAGNOSIS — Z7951 Long term (current) use of inhaled steroids: Secondary | ICD-10-CM | POA: Diagnosis not present

## 2014-10-21 DIAGNOSIS — R52 Pain, unspecified: Secondary | ICD-10-CM

## 2014-10-21 DIAGNOSIS — J9600 Acute respiratory failure, unspecified whether with hypoxia or hypercapnia: Secondary | ICD-10-CM | POA: Diagnosis not present

## 2014-10-21 DIAGNOSIS — I739 Peripheral vascular disease, unspecified: Secondary | ICD-10-CM | POA: Diagnosis present

## 2014-10-21 DIAGNOSIS — Z6820 Body mass index (BMI) 20.0-20.9, adult: Secondary | ICD-10-CM | POA: Diagnosis not present

## 2014-10-21 DIAGNOSIS — Z79899 Other long term (current) drug therapy: Secondary | ICD-10-CM

## 2014-10-21 DIAGNOSIS — J95821 Acute postprocedural respiratory failure: Secondary | ICD-10-CM

## 2014-10-21 DIAGNOSIS — R634 Abnormal weight loss: Secondary | ICD-10-CM | POA: Diagnosis present

## 2014-10-21 DIAGNOSIS — J810 Acute pulmonary edema: Secondary | ICD-10-CM | POA: Diagnosis not present

## 2014-10-21 DIAGNOSIS — M8458XA Pathological fracture in neoplastic disease, other specified site, initial encounter for fracture: Secondary | ICD-10-CM

## 2014-10-21 DIAGNOSIS — W19XXXA Unspecified fall, initial encounter: Secondary | ICD-10-CM

## 2014-10-21 DIAGNOSIS — C342 Malignant neoplasm of middle lobe, bronchus or lung: Secondary | ICD-10-CM | POA: Diagnosis present

## 2014-10-21 DIAGNOSIS — Z419 Encounter for procedure for purposes other than remedying health state, unspecified: Secondary | ICD-10-CM

## 2014-10-21 DIAGNOSIS — G9340 Encephalopathy, unspecified: Secondary | ICD-10-CM | POA: Diagnosis present

## 2014-10-21 DIAGNOSIS — C7951 Secondary malignant neoplasm of bone: Secondary | ICD-10-CM | POA: Diagnosis present

## 2014-10-21 DIAGNOSIS — I2510 Atherosclerotic heart disease of native coronary artery without angina pectoris: Secondary | ICD-10-CM | POA: Diagnosis present

## 2014-10-21 DIAGNOSIS — R339 Retention of urine, unspecified: Secondary | ICD-10-CM | POA: Diagnosis not present

## 2014-10-21 DIAGNOSIS — G834 Cauda equina syndrome: Secondary | ICD-10-CM | POA: Diagnosis not present

## 2014-10-21 DIAGNOSIS — N179 Acute kidney failure, unspecified: Secondary | ICD-10-CM | POA: Diagnosis present

## 2014-10-21 DIAGNOSIS — D62 Acute posthemorrhagic anemia: Secondary | ICD-10-CM | POA: Diagnosis not present

## 2014-10-21 DIAGNOSIS — E876 Hypokalemia: Secondary | ICD-10-CM | POA: Diagnosis not present

## 2014-10-21 DIAGNOSIS — Z8673 Personal history of transient ischemic attack (TIA), and cerebral infarction without residual deficits: Secondary | ICD-10-CM | POA: Diagnosis not present

## 2014-10-21 DIAGNOSIS — R579 Shock, unspecified: Secondary | ICD-10-CM

## 2014-10-21 DIAGNOSIS — F17210 Nicotine dependence, cigarettes, uncomplicated: Secondary | ICD-10-CM | POA: Diagnosis present

## 2014-10-21 DIAGNOSIS — M532X6 Spinal instabilities, lumbar region: Secondary | ICD-10-CM

## 2014-10-21 DIAGNOSIS — J449 Chronic obstructive pulmonary disease, unspecified: Secondary | ICD-10-CM

## 2014-10-21 DIAGNOSIS — C3490 Malignant neoplasm of unspecified part of unspecified bronchus or lung: Secondary | ICD-10-CM

## 2014-10-21 DIAGNOSIS — C799 Secondary malignant neoplasm of unspecified site: Secondary | ICD-10-CM

## 2014-10-21 DIAGNOSIS — Z85828 Personal history of other malignant neoplasm of skin: Secondary | ICD-10-CM

## 2014-10-21 DIAGNOSIS — R918 Other nonspecific abnormal finding of lung field: Secondary | ICD-10-CM

## 2014-10-21 DIAGNOSIS — R222 Localized swelling, mass and lump, trunk: Secondary | ICD-10-CM

## 2014-10-21 DIAGNOSIS — I10 Essential (primary) hypertension: Secondary | ICD-10-CM

## 2014-10-21 DIAGNOSIS — M8448XA Pathological fracture, other site, initial encounter for fracture: Secondary | ICD-10-CM

## 2014-10-21 DIAGNOSIS — R29898 Other symptoms and signs involving the musculoskeletal system: Secondary | ICD-10-CM

## 2014-10-21 LAB — BASIC METABOLIC PANEL
ANION GAP: 9 (ref 5–15)
BUN: 22 mg/dL — ABNORMAL HIGH (ref 6–20)
CALCIUM: 9.6 mg/dL (ref 8.9–10.3)
CHLORIDE: 100 mmol/L — AB (ref 101–111)
CO2: 27 mmol/L (ref 22–32)
Creatinine, Ser: 1.4 mg/dL — ABNORMAL HIGH (ref 0.61–1.24)
GFR calc non Af Amer: 54 mL/min — ABNORMAL LOW (ref >60)
Glucose, Bld: 110 mg/dL — ABNORMAL HIGH (ref 65–99)
Potassium: 4 mmol/L (ref 3.5–5.1)
SODIUM: 136 mmol/L (ref 135–145)

## 2014-10-21 LAB — CBC WITH DIFFERENTIAL/PLATELET
BASOS ABS: 0.1 10*3/uL (ref 0.0–0.1)
BASOS PCT: 0 % (ref 0–1)
EOS ABS: 0.6 10*3/uL (ref 0.0–0.7)
Eosinophils Relative: 5 % (ref 0–5)
HCT: 44.2 % (ref 39.0–52.0)
HEMOGLOBIN: 15.3 g/dL (ref 13.0–17.0)
Lymphocytes Relative: 15 % (ref 12–46)
Lymphs Abs: 1.6 10*3/uL (ref 0.7–4.0)
MCH: 32.4 pg (ref 26.0–34.0)
MCHC: 34.6 g/dL (ref 30.0–36.0)
MCV: 93.6 fL (ref 78.0–100.0)
MONOS PCT: 7 % (ref 3–12)
Monocytes Absolute: 0.8 10*3/uL (ref 0.1–1.0)
Neutro Abs: 8.1 10*3/uL — ABNORMAL HIGH (ref 1.7–7.7)
Neutrophils Relative %: 73 % (ref 43–77)
Platelets: 238 10*3/uL (ref 150–400)
RBC: 4.72 MIL/uL (ref 4.22–5.81)
RDW: 13.8 % (ref 11.5–15.5)
WBC: 11.2 10*3/uL — ABNORMAL HIGH (ref 4.0–10.5)

## 2014-10-21 LAB — HEPATIC FUNCTION PANEL
ALT: 20 U/L (ref 17–63)
AST: 22 U/L (ref 15–41)
Albumin: 3.5 g/dL (ref 3.5–5.0)
Alkaline Phosphatase: 132 U/L — ABNORMAL HIGH (ref 38–126)
Bilirubin, Direct: <0.1 mg/dL — ABNORMAL LOW (ref 0.1–0.5)
TOTAL PROTEIN: 6.6 g/dL (ref 6.5–8.1)
Total Bilirubin: 0.5 mg/dL (ref 0.3–1.2)

## 2014-10-21 LAB — URINALYSIS, ROUTINE W REFLEX MICROSCOPIC
BILIRUBIN URINE: NEGATIVE
Glucose, UA: NEGATIVE mg/dL
Hgb urine dipstick: NEGATIVE
KETONES UR: NEGATIVE mg/dL
Leukocytes, UA: NEGATIVE
Nitrite: NEGATIVE
PROTEIN: NEGATIVE mg/dL
Specific Gravity, Urine: 1.01 (ref 1.005–1.030)
UROBILINOGEN UA: 0.2 mg/dL (ref 0.0–1.0)
pH: 6 (ref 5.0–8.0)

## 2014-10-21 LAB — POC OCCULT BLOOD, ED: Fecal Occult Bld: NEGATIVE

## 2014-10-21 LAB — CALCIUM: CALCIUM: 9.3 mg/dL (ref 8.9–10.3)

## 2014-10-21 LAB — MAGNESIUM: Magnesium: 1.8 mg/dL (ref 1.7–2.4)

## 2014-10-21 LAB — SEDIMENTATION RATE: SED RATE: 31 mm/h — AB (ref 0–16)

## 2014-10-21 LAB — C-REACTIVE PROTEIN: CRP: 0.8 mg/dL (ref <1.0)

## 2014-10-21 MED ORDER — MORPHINE SULFATE (PF) 4 MG/ML IV SOLN
4.0000 mg | Freq: Once | INTRAVENOUS | Status: AC
  Administered 2014-10-21: 4 mg via INTRAVENOUS
  Filled 2014-10-21: qty 1

## 2014-10-21 MED ORDER — OLMESARTAN MEDOXOMIL-HCTZ 20-12.5 MG PO TABS
1.0000 | ORAL_TABLET | Freq: Every day | ORAL | Status: DC
Start: 2014-10-22 — End: 2014-10-21

## 2014-10-21 MED ORDER — GADOBENATE DIMEGLUMINE 529 MG/ML IV SOLN
10.0000 mL | Freq: Once | INTRAVENOUS | Status: AC | PRN
  Administered 2014-10-21: 10 mL via INTRAVENOUS

## 2014-10-21 MED ORDER — OXYCODONE HCL 5 MG PO TABS
5.0000 mg | ORAL_TABLET | ORAL | Status: DC | PRN
  Administered 2014-10-22 – 2014-10-23 (×3): 5 mg via ORAL
  Filled 2014-10-21 (×3): qty 1

## 2014-10-21 MED ORDER — ACETAMINOPHEN 650 MG RE SUPP: 650.0000 mg | Freq: Four times a day (QID) | RECTAL | Status: DC | PRN

## 2014-10-21 MED ORDER — IRBESARTAN 150 MG PO TABS
150.0000 mg | ORAL_TABLET | Freq: Every day | ORAL | Status: DC
  Administered 2014-10-22: 150 mg via ORAL
  Filled 2014-10-21: qty 1

## 2014-10-21 MED ORDER — BUDESONIDE-FORMOTEROL FUMARATE 160-4.5 MCG/ACT IN AERO
2.0000 | INHALATION_SPRAY | Freq: Two times a day (BID) | RESPIRATORY_TRACT | Status: DC
  Administered 2014-10-22 (×2): 2 via RESPIRATORY_TRACT
  Filled 2014-10-21: qty 6

## 2014-10-21 MED ORDER — IOHEXOL 300 MG/ML  SOLN
100.0000 mL | Freq: Once | INTRAMUSCULAR | Status: AC | PRN
  Administered 2014-10-21: 100 mL via INTRAVENOUS

## 2014-10-21 MED ORDER — SODIUM CHLORIDE 0.9 % IV SOLN
INTRAVENOUS | Status: DC
  Administered 2014-10-21: 23:00:00 via INTRAVENOUS
  Administered 2014-10-23: 100 mL/h via INTRAVENOUS
  Administered 2014-10-23 – 2014-10-25 (×2): via INTRAVENOUS
  Administered 2014-10-28: 1000 mL via INTRAVENOUS

## 2014-10-21 MED ORDER — VITAMIN B-1 100 MG PO TABS
100.0000 mg | ORAL_TABLET | Freq: Every day | ORAL | Status: DC
  Administered 2014-10-22 – 2014-10-28 (×5): 100 mg via ORAL
  Filled 2014-10-21 (×7): qty 1

## 2014-10-21 MED ORDER — ADULT MULTIVITAMIN W/MINERALS CH
1.0000 | ORAL_TABLET | Freq: Every day | ORAL | Status: DC
  Administered 2014-10-22 – 2014-10-28 (×5): 1 via ORAL
  Filled 2014-10-21 (×7): qty 1

## 2014-10-21 MED ORDER — ONDANSETRON HCL 4 MG/2ML IJ SOLN: 4.0000 mg | Freq: Four times a day (QID) | INTRAMUSCULAR | Status: DC | PRN

## 2014-10-21 MED ORDER — ONDANSETRON HCL 4 MG PO TABS: 4.0000 mg | ORAL_TABLET | Freq: Four times a day (QID) | ORAL | Status: DC | PRN

## 2014-10-21 MED ORDER — DOCUSATE SODIUM 100 MG PO CAPS
100.0000 mg | ORAL_CAPSULE | Freq: Two times a day (BID) | ORAL | Status: DC
  Administered 2014-10-22 – 2014-10-25 (×4): 100 mg via ORAL
  Filled 2014-10-21 (×7): qty 1

## 2014-10-21 MED ORDER — ONDANSETRON HCL 4 MG/2ML IJ SOLN
4.0000 mg | Freq: Once | INTRAMUSCULAR | Status: AC
  Administered 2014-10-21: 4 mg via INTRAVENOUS
  Filled 2014-10-21: qty 2

## 2014-10-21 MED ORDER — ALBUTEROL SULFATE (2.5 MG/3ML) 0.083% IN NEBU
2.5000 mg | INHALATION_SOLUTION | Freq: Four times a day (QID) | RESPIRATORY_TRACT | Status: DC
  Administered 2014-10-22: 2.5 mg via RESPIRATORY_TRACT
  Filled 2014-10-21: qty 3

## 2014-10-21 MED ORDER — IPRATROPIUM BROMIDE 0.02 % IN SOLN
0.5000 mg | Freq: Four times a day (QID) | RESPIRATORY_TRACT | Status: DC
  Administered 2014-10-22: 0.5 mg via RESPIRATORY_TRACT
  Filled 2014-10-21: qty 2.5

## 2014-10-21 MED ORDER — ACETAMINOPHEN 325 MG PO TABS
650.0000 mg | ORAL_TABLET | Freq: Four times a day (QID) | ORAL | Status: DC | PRN
  Administered 2014-10-22 (×2): 650 mg via ORAL
  Filled 2014-10-21 (×2): qty 2

## 2014-10-21 MED ORDER — EZETIMIBE-SIMVASTATIN 10-20 MG PO TABS
1.0000 | ORAL_TABLET | Freq: Every day | ORAL | Status: DC
  Administered 2014-10-22: 1 via ORAL
  Filled 2014-10-21 (×2): qty 1

## 2014-10-21 MED ORDER — SODIUM CHLORIDE 0.9 % IJ SOLN
3.0000 mL | Freq: Two times a day (BID) | INTRAMUSCULAR | Status: DC
  Administered 2014-10-24 – 2014-10-28 (×7): 3 mL via INTRAVENOUS

## 2014-10-21 MED ORDER — DEXAMETHASONE SODIUM PHOSPHATE 10 MG/ML IJ SOLN
10.0000 mg | Freq: Once | INTRAMUSCULAR | Status: AC
  Administered 2014-10-21: 10 mg via INTRAVENOUS
  Filled 2014-10-21: qty 1

## 2014-10-21 MED ORDER — DIAZEPAM 2 MG PO TABS
ORAL_TABLET | ORAL | Status: AC
  Filled 2014-10-21: qty 1

## 2014-10-21 MED ORDER — DIAZEPAM 2 MG PO TABS
2.0000 mg | ORAL_TABLET | Freq: Once | ORAL | Status: AC
  Administered 2014-10-21: 2 mg via ORAL
  Filled 2014-10-21: qty 1

## 2014-10-21 MED ORDER — MORPHINE SULFATE (PF) 4 MG/ML IV SOLN
4.0000 mg | INTRAVENOUS | Status: AC | PRN
  Administered 2014-10-21 – 2014-10-22 (×3): 4 mg via INTRAVENOUS
  Filled 2014-10-21 (×4): qty 1

## 2014-10-21 MED ORDER — IOHEXOL 300 MG/ML  SOLN
25.0000 mL | Freq: Once | INTRAMUSCULAR | Status: DC | PRN
Start: 2014-10-21 — End: 2014-10-28

## 2014-10-21 MED ORDER — ONDANSETRON HCL 4 MG/2ML IJ SOLN
4.0000 mg | Freq: Once | INTRAMUSCULAR | Status: AC
Start: 2014-10-21 — End: 2014-10-21
  Administered 2014-10-21: 4 mg via INTRAVENOUS
  Filled 2014-10-21: qty 2

## 2014-10-21 MED ORDER — HYDROCHLOROTHIAZIDE 12.5 MG PO CAPS
12.5000 mg | ORAL_CAPSULE | Freq: Every day | ORAL | Status: DC
  Administered 2014-10-22: 12.5 mg via ORAL
  Filled 2014-10-21: qty 1

## 2014-10-21 MED ORDER — POLYETHYLENE GLYCOL 3350 17 G PO PACK
17.0000 g | PACK | Freq: Every day | ORAL | Status: DC | PRN
  Administered 2014-10-25: 17 g via ORAL
  Filled 2014-10-21: qty 1

## 2014-10-21 MED ORDER — FOLIC ACID 1 MG PO TABS
1.0000 mg | ORAL_TABLET | Freq: Every day | ORAL | Status: DC
  Administered 2014-10-22 – 2014-10-28 (×5): 1 mg via ORAL
  Filled 2014-10-21 (×7): qty 1

## 2014-10-21 MED ORDER — METHOCARBAMOL 1000 MG/10ML IJ SOLN
1000.0000 mg | Freq: Once | INTRAVENOUS | Status: AC
  Administered 2014-10-21: 1000 mg via INTRAVENOUS
  Filled 2014-10-21: qty 10

## 2014-10-21 NOTE — ED Notes (Signed)
Pt back from CT, now to be transferred to 3S02.

## 2014-10-21 NOTE — ED Notes (Signed)
Pt fell on Saturday, wife noticed AMS on Saturday.  C/o lower back pain after fall.  Rates pain 9/10.

## 2014-10-21 NOTE — Consult Note (Signed)
CC: Right leg weakness Chief Complaint  Patient presents with  . Fall  . Altered Mental Status    HPI: Edward Duran is a 59 y.o. male who reports 1 month of progressive low back pain and right leg weakness. He denies bowel incontinence or urinary retention. He denies sensory changes in his lower extremities or around his perineum. He was evaluated today for his lower extremity weakness and found to have a pathological fracture at L3 with extension into the pedicles dorsally into the posterior elements. He denies pain in his legs.  He voided just prior to this interview.  PMH: Past Medical History  Diagnosis Date  . COPD (chronic obstructive pulmonary disease)   . Hypertension   . Stroke   . Cancer   . Carotid artery disease     PSH: History reviewed. No pertinent past surgical history.  SH: Social History  Substance Use Topics  . Smoking status: Current Every Day Smoker  . Smokeless tobacco: None  . Alcohol Use: No    MEDS: Prior to Admission medications   Medication Sig Start Date End Date Taking? Authorizing Provider  budesonide-formoterol (SYMBICORT) 160-4.5 MCG/ACT inhaler Inhale 2 puffs into the lungs 2 (two) times daily. 05/29/14  Yes Historical Provider, MD  ezetimibe-simvastatin (VYTORIN) 10-20 MG per tablet Take 1 tablet by mouth daily.   Yes Historical Provider, MD  olmesartan-hydrochlorothiazide (BENICAR HCT) 20-12.5 MG per tablet Take 1 tablet by mouth daily.   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 There is significant vertebral body height loss. The tumor extends into the pedicles especially on the right. It entirely effaces the thecal sac and compresses the cauda equina.  IMPRESSION: - 59 y.o. male with pathological fracture of L3 due to tumor involvement. 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 I have advised him that I recommend an L3 corpectomy with interbody cage as well as dorsal internal fixation and fusion from L1-L5 and decompression of his cauda equina. I explained the risks and benefits of this operation as well as the risks and benefits of the alternatives. He would like to have the night to think about it I think this is wise. This will also allow [place] greater time to understand the metastatic disease burden and make an estimated at his potential prognosis. I think as long as his prognosis is 3 months or greater we should seriously consider proceeding. However, if his prognosis is less than 3 months there is little benefit to proceeding with surgery.  PLAN: - Please perform post void residual and leave foley in place for residual > 250cc. - Lumbar spine CT reformats - Possibly L3 corpectomy, L1-5 dorsal internal fixation and fusion, decompression of L3. - NPO after midnight
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
## 2014-10-21 NOTE — H&P (Signed)
[facility] History and Physical  Edward Duran NAT:[id number] DOB: [birth year] DOA: 10/21/2014  Referring physician: Deno Etienne, DO PCP: No PCP Per Patient   Chief Complaint: Hilar Mass  HPI: Edward Duran is a 59 y.o. male with history of COPD HTN HLD presents with evaluation of weakness of the left leg with hyper-reflexia. Patient states that he has been having pain in the left leg for a couple of months. Associated with the pain is lower back pain. He went to the ED in [facility] and at that time a diagnosis of arthritis was made and was given pain medications. Patient also does have a history of skin cancer. He states that his pain was not getting better. He went back to the ED again because of the back pain and was sent down from [facility] to get an MRI here. In the ED here he was found to have a pathological fracture of the L3 vertebra. Patient also was noted to have a mass in the hilar area of the right lung along with a mass in the RLL. He is a smoker for at least 40 years. Patient states that he has had weight loss. He has had poor appetite. 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 Patient today also started to notice that the weakness in the left leg has gotten worse to the point that he was falling at home frequently. He has no LOC and no head injury. He states that the knee seems to give out.   Review of Systems:  Constitutional:  +weight loss, no night sweats, Fevers HEENT:  No headaches, sneezing, itching, ear ache, nasal congestion, post nasal drip,  Cardio-vascular:  No chest pain, swelling in lower extremities, palpitations  GI:  No heartburn, indigestion, abdominal pain, nausea, vomiting, diarrhea  Resp:  +shortness of breath with exertion. No coughing up of blood ( he has in the past had blood in his sputum).No change in color of mucus. Skin:  Surgery for skin cancer left side of face GU:  no dysuria, change in color of urine Musculoskeletal:  No joint pain or  swelling Psych:  No change in mood or affect  Past Medical History  Diagnosis Date  . COPD (chronic obstructive pulmonary disease)   . Hypertension   . Stroke   . Cancer   . Carotid artery disease    History reviewed. No pertinent past surgical history. Social History:  reports that he has been smoking.  He does not have any smokeless tobacco history on file. He reports that he uses illicit drugs. He reports that he does not drink alcohol.  No Known Allergies  History reviewed. No pertinent family history.   Prior to Admission medications   Medication Sig Start Date End Date Taking? Authorizing Provider  budesonide-formoterol (SYMBICORT) 160-4.5 MCG/ACT inhaler Inhale 2 puffs into the lungs 2 (two) times daily. 05/29/14  Yes Historical Provider, MD  ezetimibe-simvastatin (VYTORIN) 10-20 MG per tablet Take 1 tablet by mouth daily.   Yes Historical Provider, MD  olmesartan-hydrochlorothiazide (BENICAR HCT) 20-12.5 MG per tablet Take 1 tablet by mouth daily.   Yes Historical Provider, MD   Physical Exam: Filed Vitals:   10/21/14 1515 10/21/14 1600 10/21/14 1615 10/21/14 1855  BP: 96/65 114/71 114/71 132/68  Pulse: 94 88 88 91  Temp:      TempSrc:      Resp:    17  Height:      Weight:      SpO2:  93%  92% 93%    Wt Readings from Last 3 Encounters:  10/21/14 58.968 kg (130 lb)  09/10/14 58.968 kg (130 lb)    General:  Appears calm and comfortable Eyes: PERRL, normal lids, irises & conjunctiva ENT: grossly normal hearing, lips & tongue Neck: no LAD, masses or thyromegaly Cardiovascular: RRR, no m/r/g. No LE edema Respiratory: CTA bilaterally, no w/r/r Abdomen: soft, ntnd Skin: old scar from skin surgery Musculoskeletal: +increased reflexes on left side Psychiatric: grossly normal mood and affect Neurologic: 4/5 left sided weakness with increased reflexes sensory intact          Labs on Admission:  Basic Metabolic Panel:  Recent Labs Lab 10/21/14 1130  NA 136    K 4.0  CL 100*  CO2 27  GLUCOSE 110*  BUN 22*  CREATININE 1.40*  CALCIUM 9.6   Liver Function Tests: No results for input(s): AST, ALT, ALKPHOS, BILITOT, PROT, ALBUMIN in the last 168 hours. No results for input(s): LIPASE, AMYLASE in the last 168 hours. No results for input(s): AMMONIA in the last 168 hours. CBC:  Recent Labs Lab 10/21/14 1130  WBC 11.2*  NEUTROABS 8.1*  HGB 15.3  HCT 44.2  MCV 93.6  PLT 238   Cardiac Enzymes: No results for input(s): CKTOTAL, CKMB, CKMBINDEX, TROPONINI in the last 168 hours.  BNP (last 3 results) No results for input(s): BNP in the last 8760 hours.  ProBNP (last 3 results) No results for input(s): PROBNP in the last 8760 hours.  CBG: No results for input(s): GLUCAP in the last 168 hours.  Radiological Exams on Admission: Mr Thoracic Spine W Wo Contrast  10/21/2014   CLINICAL DATA:  Back pain.  Recent falls.  EXAM: MRI THORACIC AND LUMBAR SPINE WITHOUT AND WITH CONTRAST  TECHNIQUE: Multiplanar and multiecho pulse sequences of the thoracic and lumbar spine were obtained without and with intravenous contrast.  CONTRAST:  46m MULTIHANCE GADOBENATE DIMEGLUMINE 529 MG/ML IV SOLN  COMPARISON:  Lumbar radiographs 09/10/2014. No prior MRI for comparison.  FINDINGS: MR THORACIC SPINE FINDINGS  Negative for thoracic spine fracture. 1 cm lesion in the T10 vertebral body is low signal on T1 and T2 but does not enhance. Given the findings lumbar spine, this may represent metastatic disease however could also be a benign lesion such as hemangioma. No other worrisome lesions in the thoracic spine.  No cord compression.  Spinal cord signal is normal.  No significant disc degeneration or disc protrusion  No enhancing lesions are seen postcontrast.  Right lower lobe lesion posteriorly measuring approximately 2 x 2.5 cm. There is right hilar adenopathy. Findings are worrisome for carcinoma of the lung. Chest CT recommended.  MR LUMBAR SPINE FINDINGS  Moderate  levoscoliosis.  Distended urinary bladder.  Pathologic fracture of L2. There is extensive tumor throughout the L3 vertebral body extending to the right transverse process and posterior elements on the right. There is extensive tumor within the canal and right foramen at L3-4. Severe spinal stenosis with compression of the thecal sac due to tumor. This is likely the cause of the distended urinary bladder.  Conus medullaris normal. No other fracture or metastatic disease identified in the lumbar spine.  Moderate spinal stenosis L4-5 due to disc and facet degeneration.  IMPRESSION: Findings worrisome for lung cancer with right lower lobe density and right hilar adenopathy. CT chest with contrast recommended for further evaluation.  1 cm nonenhancing lesion T10 vertebral body, worrisome for metastatic disease. No thoracic cord compression or fracture.  Pathologic fracture  of L3. Tumor extends into the posterior elements and transverse process on the right. There is extensive tumor in the canal causing severe spinal stenosis. Distended urinary bladder likely due to severe spinal stenosis and cauda equina syndrome.  Moderate spinal stenosis L4-5 due to degenerative change.  Critical Value/emergent results were called by telephone at the time of interpretation on 10/21/2014 at 6:54 pm to Dr. Tanna Furry , who verbally acknowledged these results.   Electronically Signed   By: Franchot Gallo M.D.   On: 10/21/2014 18:56   Mr Lumbar Spine W Wo Contrast  10/21/2014   CLINICAL DATA:  Back pain.  Recent falls.  EXAM: MRI THORACIC AND LUMBAR SPINE WITHOUT AND WITH CONTRAST  TECHNIQUE: Multiplanar and multiecho pulse sequences of the thoracic and lumbar spine were obtained without and with intravenous contrast.  CONTRAST:  28m MULTIHANCE GADOBENATE DIMEGLUMINE 529 MG/ML IV SOLN  COMPARISON:  Lumbar radiographs 09/10/2014. No prior MRI for comparison.  FINDINGS: MR THORACIC SPINE FINDINGS  Negative for thoracic spine fracture. 1 cm  lesion in the T10 vertebral body is low signal on T1 and T2 but does not enhance. Given the findings lumbar spine, this may represent metastatic disease however could also be a benign lesion such as hemangioma. No other worrisome lesions in the thoracic spine.  No cord compression.  Spinal cord signal is normal.  No significant disc degeneration or disc protrusion  No enhancing lesions are seen postcontrast.  Right lower lobe lesion posteriorly measuring approximately 2 x 2.5 cm. There is right hilar adenopathy. Findings are worrisome for carcinoma of the lung. Chest CT recommended.  MR LUMBAR SPINE FINDINGS  Moderate levoscoliosis.  Distended urinary bladder.  Pathologic fracture of L2. There is extensive tumor throughout the L3 vertebral body extending to the right transverse process and posterior elements on the right. There is extensive tumor within the canal and right foramen at L3-4. Severe spinal stenosis with compression of the thecal sac due to tumor. This is likely the cause of the distended urinary bladder.  Conus medullaris normal. No other fracture or metastatic disease identified in the lumbar spine.  Moderate spinal stenosis L4-5 due to disc and facet degeneration.  IMPRESSION: Findings worrisome for lung cancer with right lower lobe density and right hilar adenopathy. CT chest with contrast recommended for further evaluation.  1 cm nonenhancing lesion T10 vertebral body, worrisome for metastatic disease. No thoracic cord compression or fracture.  Pathologic fracture of L3. Tumor extends into the posterior elements and transverse process on the right. There is extensive tumor in the canal causing severe spinal stenosis. Distended urinary bladder likely due to severe spinal stenosis and cauda equina syndrome.  Moderate spinal stenosis L4-5 due to degenerative change.  Critical Value/emergent results were called by telephone at the time of interpretation on 10/21/2014 at 6:54 pm to Dr. MTanna Furry, who  verbally acknowledged these results.   Electronically Signed   By: CFranchot GalloM.D.   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 -spoke with  neuro surgery no steroids at this time -will check post void residual  2. Hilar Mass -as seen on MRI scan -will get CT scan of the chest tonight -will need pulmonary consult in am -oncology consult in am  3. HTN -will monitor pressures -continue with benicarHCT  4. COPD -on symbicort will continue  5. AKI -started on IVF -will monitor labs  6. Hyperlipidemia -patient is on vytorin will continue -check lipid profile  7. Hyperglycemia -will monitor FSBS -SSI as needed     Code Status: Full Code (must indicate code status--if unknown or must be presumed, indicate so) DVT Prophylaxis:SCD Family Communication: son (indicate person spoken with, if applicable, with phone number if by telephone) Disposition Plan: home (indicate anticipated LOS)    [facility] Pager [phone]

## 2014-10-21 NOTE — ED Notes (Signed)
This RN offered pt morphine but pt refused at this time.

## 2014-10-21 NOTE — Progress Notes (Signed)
Neb treatments not given, pt is resting and sleeping comfortably. O2 saturation are acceptable presenting at 94% on Room Air. Pt breath sounds are Clear at this time. No signs of respiratory distress no complications noted.
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
## 2014-10-21 NOTE — ED Notes (Signed)
Patient returned from MRI.

## 2014-10-21 NOTE — ED Notes (Signed)
Pt finished drinking oral CT contrast and CT made aware.

## 2014-10-21 NOTE — ED Notes (Signed)
Pt c/o mid/lower back pain from falls in past few days. Pt states his knees give out on him and he falls. Son states pt has been falling x one month now.

## 2014-10-21 NOTE — ED Notes (Signed)
Patient transported to MRI 

## 2014-10-22 DIAGNOSIS — C7951 Secondary malignant neoplasm of bone: Secondary | ICD-10-CM

## 2014-10-22 DIAGNOSIS — J449 Chronic obstructive pulmonary disease, unspecified: Secondary | ICD-10-CM

## 2014-10-22 DIAGNOSIS — E46 Unspecified protein-calorie malnutrition: Secondary | ICD-10-CM

## 2014-10-22 DIAGNOSIS — C801 Malignant (primary) neoplasm, unspecified: Secondary | ICD-10-CM

## 2014-10-22 DIAGNOSIS — R590 Localized enlarged lymph nodes: Secondary | ICD-10-CM

## 2014-10-22 LAB — COMPREHENSIVE METABOLIC PANEL
ALBUMIN: 3.4 g/dL — AB (ref 3.5–5.0)
ALK PHOS: 131 U/L — AB (ref 38–126)
ALT: 20 U/L (ref 17–63)
ANION GAP: 10 (ref 5–15)
AST: 22 U/L (ref 15–41)
BILIRUBIN TOTAL: 0.2 mg/dL — AB (ref 0.3–1.2)
BUN: 19 mg/dL (ref 6–20)
CALCIUM: 9.6 mg/dL (ref 8.9–10.3)
CO2: 24 mmol/L (ref 22–32)
Chloride: 101 mmol/L (ref 101–111)
Creatinine, Ser: 1.52 mg/dL — ABNORMAL HIGH (ref 0.61–1.24)
GFR, EST AFRICAN AMERICAN: 56 mL/min — AB (ref >60)
GFR, EST NON AFRICAN AMERICAN: 48 mL/min — AB (ref >60)
Glucose, Bld: 139 mg/dL — ABNORMAL HIGH (ref 65–99)
POTASSIUM: 5.3 mmol/L — AB (ref 3.5–5.1)
Sodium: 135 mmol/L (ref 135–145)
TOTAL PROTEIN: 6.9 g/dL (ref 6.5–8.1)

## 2014-10-22 LAB — CBC
HCT: 40.9 % (ref 39.0–52.0)
Hemoglobin: 14.5 g/dL (ref 13.0–17.0)
MCH: 33 pg (ref 26.0–34.0)
MCHC: 35.5 g/dL (ref 30.0–36.0)
MCV: 93.2 fL (ref 78.0–100.0)
PLATELETS: 208 10*3/uL (ref 150–400)
RBC: 4.39 MIL/uL (ref 4.22–5.81)
RDW: 14 % (ref 11.5–15.5)
WBC: 5.5 10*3/uL (ref 4.0–10.5)

## 2014-10-22 LAB — PROTIME-INR
INR: 1.06 (ref 0.00–1.49)
Prothrombin Time: 14 seconds (ref 11.6–15.2)

## 2014-10-22 LAB — APTT: aPTT: 30 seconds (ref 24–37)

## 2014-10-22 LAB — GLUCOSE, CAPILLARY: GLUCOSE-CAPILLARY: 114 mg/dL — AB (ref 65–99)

## 2014-10-22 LAB — TSH: TSH: 0.984 u[IU]/mL (ref 0.350–4.500)

## 2014-10-22 LAB — ABO/RH: ABO/RH(D): A POS

## 2014-10-22 LAB — MRSA PCR SCREENING: MRSA by PCR: NEGATIVE

## 2014-10-22 LAB — PREPARE RBC (CROSSMATCH)

## 2014-10-22 MED ORDER — BOOST PLUS PO LIQD
237.0000 mL | Freq: Three times a day (TID) | ORAL | Status: DC
  Administered 2014-10-22 – 2014-10-27 (×7): 237 mL via ORAL
  Filled 2014-10-22 (×25): qty 237

## 2014-10-22 MED ORDER — IPRATROPIUM-ALBUTEROL 0.5-2.5 (3) MG/3ML IN SOLN
3.0000 mL | Freq: Four times a day (QID) | RESPIRATORY_TRACT | Status: DC
  Administered 2014-10-22 – 2014-10-28 (×22): 3 mL via RESPIRATORY_TRACT
  Filled 2014-10-22 (×25): qty 3

## 2014-10-22 MED ORDER — PANTOPRAZOLE SODIUM 40 MG PO TBEC
40.0000 mg | DELAYED_RELEASE_TABLET | Freq: Two times a day (BID) | ORAL | Status: DC
  Administered 2014-10-22: 40 mg via ORAL
  Filled 2014-10-22: qty 1

## 2014-10-22 MED ORDER — IPRATROPIUM-ALBUTEROL 0.5-2.5 (3) MG/3ML IN SOLN
3.0000 mL | Freq: Four times a day (QID) | RESPIRATORY_TRACT | Status: DC
  Administered 2014-10-22: 3 mL via RESPIRATORY_TRACT
  Filled 2014-10-22: qty 3

## 2014-10-22 MED ORDER — IPRATROPIUM-ALBUTEROL 0.5-2.5 (3) MG/3ML IN SOLN: 3.0000 mL | Freq: Four times a day (QID) | RESPIRATORY_TRACT | Status: DC | PRN

## 2014-10-22 MED ORDER — SODIUM CHLORIDE 0.9 % IV SOLN: Freq: Once | INTRAVENOUS | Status: DC

## 2014-10-22 MED ORDER — MORPHINE SULFATE (PF) 2 MG/ML IV SOLN
1.0000 mg | INTRAVENOUS | Status: DC | PRN
  Administered 2014-10-22 – 2014-10-23 (×3): 2 mg via INTRAVENOUS
  Filled 2014-10-22 (×3): qty 1

## 2014-10-22 MED ORDER — DEXAMETHASONE SODIUM PHOSPHATE 10 MG/ML IJ SOLN
4.0000 mg | Freq: Four times a day (QID) | INTRAMUSCULAR | Status: DC
  Filled 2014-10-22 (×5): qty 0.4

## 2014-10-22 NOTE — Progress Notes (Signed)
No acute overnight events AVSS Awake, alert, oriented Motor 5/5 BLE Sensation intact to light touch To OR today for L3 decompression, corpectomy, L1-5 dorsal internal fixation and fusion

## 2014-10-22 NOTE — Progress Notes (Signed)
Utilization review completed. Verne Cove, RN, BSN. 

## 2014-10-22 NOTE — Consult Note (Signed)
[place]  Telephone:([phone]) [place]                                MR#: [id number]  DOB: [birth year]                       CSN#: [id number]  Referring MD: Dr. Sheliah Plane [facility] Patient Care Team: No Pcp Per Patient as PCP - General (General Practice)  Reason for Consult: Metastatic Bone Lesions                                     Lung Mass   Edward Duran is a 59 y.o. male Admitted on 10/20/2014 with 2- month history of progressive low back pain and right lower extremity weakness. As the pain became severe, he sought evaluation at the emergency department. The patient had initially been seen in [facility], at which time he had been diagnosed with arthritis, receiving a prescription for analgesics. Denies numbness, tingling. He has not been able to ambulate, due to intermittent lower extremity weakness. He denies any bowel or urinary incontinence. Denies any headaches seizures. The patient denies any dyspnea at rest, only on exertion. He denies any chest pain or palpitations. He denies any nausea or vomiting. His appetite is decreased, having lost 40 pounds over the last 2 months. He denies any bleeding issues. MRI of the spine was remarkable for a pathological fracture of the L3 vertebra. The tumor extends into the posterior elements and transverse process on the right. There is extensive tumor in the canal causing severe spinal stenosis, without cord compression. In addition, a nonenhancing lesion at L2 andT10 was seen, suspicious for metastatic disease. An incidental right lower lobe lesion posteriorly measuring 2 x 2.5 cm with right hilar adenopathy was noted.All these findings are suspicious for metastatic lung cancer, but no tissue diagnosis is available at this time. CT of the abdomen and pelvis is remarkable for the above findings, as well as complete collapse of the visualized right middle lobe likely related to  central obstruction by the known lung mass. CT of the chest is pending. CT of the head is negative for intracranial malignancy. Neurosurgical consultation has been obtained, and the patient is to undergo L3 decompression,  dorsal internal fixation and fusion from L1-L5 and decompression of his cauda equina 10/23/2014, at which time a diagnosis will be obtained. Radiation oncology to see as well. Results factors include lifetime tobacco abuse, and a history of alcohol in the past. 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  We were requested to see this patient with recommendations.        PMH:  Past Medical History  Diagnosis Date  . COPD (chronic obstructive pulmonary disease)   . Hypertension   . Stroke   . Cancer   . Carotid artery disease   History of squamous  cell carcinoma of the left temple Status post resection 5/206 followed at [facility]. Depression  Surgeries: Status post Mohs surgery of the left temple due to basal cell carcinoma, remote  Allergies:  No Known Allergies  Medications:   Prior to Admission:  Prescriptions prior to admission  Medication Sig Dispense Refill Last Dose  . budesonide-formoterol (SYMBICORT) 160-4.5 MCG/ACT inhaler Inhale 2 puffs into the lungs 2 (two) times daily.   10/21/2014 at Unknown time  . ezetimibe-simvastatin (VYTORIN) 10-20 MG per tablet Take 1 tablet by mouth daily.   10/21/2014 at Unknown time  . olmesartan-hydrochlorothiazide (BENICAR HCT) 20-12.5 MG per tablet Take 1 tablet by mouth daily.   10/21/2014 at Unknown time   Scheduled Meds: . sodium chloride   Intravenous Once  . budesonide-formoterol  2 puff Inhalation BID  . docusate sodium  100 mg Oral BID  . ezetimibe-simvastatin  1 tablet Oral Daily  . folic acid  1 mg  Oral Daily  . ipratropium-albuterol  3 mL Nebulization Q6H  . multivitamin with minerals  1 tablet Oral Daily  . pantoprazole  40 mg Oral BID  . sodium chloride  3 mL Intravenous Q12H  . thiamine  100 mg Oral Daily   Continuous Infusions: . sodium chloride 100 mL/hr at 10/22/14 1136   PRN Meds:.acetaminophen **OR** acetaminophen, iohexol, ipratropium-albuterol, ondansetron **OR** ondansetron (ZOFRAN) IV, oxyCODONE, polyethylene glycol   ROS: Constitutional: Denies fevers, chills or abnormal night sweats Eyes: Denies blurriness of vision, double vision or watery eyes Ears, nose, mouth, throat, and face: Denies mucositis or sore throat Respiratory: Positive for productive non blody cough, positive for dyspnea on exertion Cardiovascular: Denies palpitation, chest discomfort or lower extremity swelling Gastrointestinal:  Denies nausea, heartburn or change in bowel habits Skin: Denies abnormal skin rashes. 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 Multiple tatooes. Lymphatics: Denies new lymphadenopathy or easy bruising Neurological: Denies numbness, tingling. He has not been able to ambulate, due to intermittent lower extremity weakness. He denies any bowel or urinary incontinence. Denies any seizures. Behavioral/Psych: Mood is stable, no new changes  All other systems were reviewed with the patient and are negative.   Family History:   Father died with lung cancer. Mother died with breast cancer. He has several family members with a history of prostate cancer. No family history of hematological  disorders.  Social History:  reports that he has been smoking. He smokes about 1 pack a day of cigarettes since age 86. He does not have any smokeless tobacco history on file. He reports that he uses illicit drugs. He reports that he does  not drink alcohol. He is a recovering alcoholic, none for 2 years He is on disability, was a truck Public librarian.Lives in [place]   Physical Exam     Filed Vitals:   10/22/14 1153  BP:   Pulse:   Temp: 97.9 F (36.6 C)  Resp:    Filed Weights   10/21/14 1047 10/21/14 2219  Weight: 130 lb (58.968 kg) 112 lb 7 oz (51 kg)    GENERAL:alert, no distress and comfortable. He is very thin and frail, ill appearing SKIN: skin color, texture, turgor are normal dry, no rashes. Visit well-healed scar on the left temple, consistent with resection of basal cell carcinoma in May 2016. 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 Multiple tatooes. EYES: normal, conjunctiva are pink and non-injected, sclera clear OROPHARYNX:no exudate, no erythema and lips, buccal mucosa, and  tongue normal  NECK: supple, thyroid normal size, non-tender, without nodularity LYMPH:  no palpable lymphadenopathy in the cervical, axillary or inguinal area LUNGS: clear to auscultation and percussion with normal breathing effort HEART: regular rate & rhythm and no murmurs and no lower extremity edema ABDOMEN:abdomen soft, non-tender and normal bowel sounds Musculoskeletal:no cyanosis of digits and no clubbing. Nails are very brittle  PSYCH: alert & oriented x 3 with fluent speech NEURO: no focal motor/sensory deficits    Labs:  CBC   Recent Labs Lab 11-10-2014 1130 10/22/14 0330  WBC 11.2* 5.5  HGB 15.3 14.5  HCT 44.2 40.9  PLT 238 208  MCV 93.6 93.2  MCH 32.4 33.0  MCHC 34.6 35.5  RDW 13.8 14.0  LYMPHSABS 1.6  --   MONOABS 0.8  --   EOSABS 0.6  --   BASOSABS 0.1  --      CMP    Recent Labs Lab 11-10-14 1130 11-10-14 2255 10/22/14 0330  NA 136  --  135  K 4.0  --  5.3*  CL 100*  --  101  CO2 27  --  24  GLUCOSE 110*  --  139*  BUN 22*  --  19  CREATININE 1.40*  --  1.52*  CALCIUM 9.6 9.3 9.6  MG   --  1.8  --   AST  --  22 22  ALT  --  20 20  ALKPHOS  --  132* 131*  BILITOT  --  0.5 0.2*      Imaging Studies:  Ct Head W Wo Contrast  10-Nov-2014   EXAM: CT HEAD WITHOUT AND WITH CONTRAST  TECHNIQUE: Contiguous axial images were obtained from the base of the skull through the vertex without and with intravenous contrast  CONTRAST:  130m OMNIPAQUE IOHEXOL 300 MG/ML  SOLN  COMPARISON:  None.  FINDINGS: Encephalomalacia within the left frontal and parietal lobes, consistent with remote left MCA territory infarct. Scattered intracranial atherosclerotic cyst noted.  No acute large vessel territory infarct. No intracranial hemorrhage. No mass lesion, midline shift, or mass effect. No hydrocephalus. No abnormal enhancement.  Scalp soft tissues within normal limits. No acute abnormality about the orbits.  Paranasal sinuses and mastoid air cells are clear.  Calvarium intact.  No focal osseous lesion.  IMPRESSION: 1. No acute intracranial process.  No abnormal enhancement. 2. Remote left MCA territory infarct.   Electronically Signed   By: BJeannine BogaM.D.   On: 009/25/201623:25   Mr Thoracic Spine W Wo Contrast  92016-09-25   COMPARISON:  Lumbar radiographs 09/10/2014. No prior MRI for comparison.  FINDINGS: MR THORACIC SPINE FINDINGS  Negative for thoracic spine fracture. 1 cm lesion in the T10 vertebral body is low signal on T1 and T2 but does not enhance. Given the findings lumbar spine, this may represent metastatic disease however could also be a benign lesion such as hemangioma. No other worrisome lesions in the thoracic spine.  No cord compression.  Spinal cord signal is normal.  No significant disc degeneration or disc protrusion  No enhancing lesions are seen postcontrast.  Right lower lobe lesion posteriorly measuring approximately 2 x 2.5 cm. There is right hilar adenopathy. Findings are worrisome for carcinoma of the lung. Chest CT recommended.   IMPRESSION: Findings worrisome for lung  cancer with right lower lobe density and right hilar adenopathy. CT chest with contrast recommended for further evaluation.  1 cm nonenhancing lesion T10 vertebral body, worrisome for metastatic disease. No  thoracic cord compression or fracture.  Pathologic fracture of L3. Tumor extends into the posterior elements and transverse process on the right. There is extensive tumor in the canal causing severe spinal stenosis. Distended urinary bladder likely due to severe spinal stenosis and cauda equina syndrome.  Moderate spinal stenosis L4-5 due to degenerative change.  Critical Value/emergent results were called by telephone at the time of interpretation on 10/21/2014 at 6:54 pm to Dr. Tanna Furry , who verbally acknowledged these results.   Electronically Signed   By: Franchot Gallo M.D.   On: 10/21/2014 18:56   Mr Lumbar Spine W Wo Contrast  10/21/2014   COMPARISON:  Lumbar radiographs 09/10/2014. No prior MRI for comparison.  FINDINGS:  MR LUMBAR SPINE FINDINGS  Moderate levoscoliosis.  Distended urinary bladder.  Pathologic fracture of L2. There is extensive tumor throughout the L3 vertebral body extending to the right transverse process and posterior elements on the right. There is extensive tumor within the canal and right foramen at L3-4. Severe spinal stenosis with compression of the thecal sac due to tumor. This is likely the cause of the distended urinary bladder.  Conus medullaris normal. No other fracture or metastatic disease identified in the lumbar spine.  Moderate spinal stenosis L4-5 due to disc and facet degeneration.  IMPRESSION: Findings worrisome for lung cancer with right lower lobe density and right hilar adenopathy. CT chest with contrast recommended for further evaluation.  1 cm nonenhancing lesion T10 vertebral body, worrisome for metastatic disease. No thoracic cord compression or fracture.  Pathologic fracture of L3. Tumor extends into the posterior elements and transverse process on the  right. There is extensive tumor in the canal causing severe spinal stenosis. Distended urinary bladder likely due to severe spinal stenosis and cauda equina syndrome.  Moderate spinal stenosis L4-5 due to degenerative change.  Critical Value/emergent results were called by telephone at the time of interpretation on 10/21/2014 at 6:54 pm to Dr. Tanna Furry , who verbally acknowledged these results.   Electronically Signed   By: Franchot Gallo M.D.   On: 10/21/2014 18:56   Ct Abdomen Pelvis W Contrast  10/21/2014     COMPARISON:  Spine MRI dated 10/21/2014  FINDINGS: There is consolidative changes of the visualized right middle lobe. Endobronchial material noted within the visualized right lower lobe bronchi. These findings may be sequela of central obstruction caused by known lung mass. No intra-abdominal free air or free fluid identified.  Small scattered hepatic calcifications noted, likely sequela of prior insult or infection. There are multiple stones within the gallbladder. No associated inflammatory changes or CT evidence of acute cholecystitis. The pancreas appears unremarkable. Multiple small calcified splenic granuloma noted. The adrenal glands appear unremarkable. There is moderate right renal atrophy. There bilateral renal vascular calcification. There is no hydronephrosis on either side. Two multiple bilateral renal hypodense lesions most of which are too small to characterize the largest lesion is located in the inferior pole of the left kidney and measures 1.6 cm of and demonstrates the fluid attenuation most likely a cyst. Ultrasound may provide better characterization. The visualized ureters and urinary bladder appear unremarkable. The prostate gland is grossly unremarkable.  Oral contrast opacifies the stomach and multiple loops of small bowel. There is no evidence of bowel obstruction or inflammation. The appendix is not visualized with certainty. No inflammatory changes identified in the right lower  quadrant.  The abdominal aorta is tortuous. There is aortoiliac atherosclerotic disease. There is a 3 cm partially thrombosed infrarenal  abdominal aortic aneurysm. There is thrombosis of the right common iliac artery as well as thrombosis of the right internal and external iliac arteries. There is reconstitution of the flow within the right femoral artery. The origins of the celiac axis, SMA appear patent. There are atherosclerotic calcifications of the renal artery ostia. No portal venous gas identified. There is no lymphadenopathy.  There is a 5.4 x 7.2 cm soft tissue mass involving the vertebral body, right pedicle, right transverse process and right posterior elements of the L3 vertebra. There is destructive changes and loss of vertebral body height and complete collapse of the right side of the L3 vertebra. There is associated lumbar levoscoliosis. There is extension of the mass into the central canal and neural foramina at L3. There is focal compression and narrowing of the thecal sac. These findings are better evaluated on the MRI.  IMPRESSION: Complete collapse of the visualized right middle lobe likely related to central obstruction by the known lung mass.  Cholelithiasis.  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 There is reconstitution of the flow in the right femoral artery. These findings appear to be chronic.  Large destructive soft tissue mass involving the L3 vertebral as seen on the earlier MRI and compatible with malignancy. There is associated pathologic fracture and complete collapse of the right side of the L3 vertebra.   Electronically Signed   By: Anner Crete M.D.   On: 10/21/2014 22:48      A/P: 59 y.o. male with   Metastatic bone lesions Lung mass with hilar adenopathy These findings are suspicious for lung primary. Neurosurgical consultation has been obtained, and  the patient is to undergo L3 decompression, dorsal internal fixation and fusion from L1-L5 and decompression of his cauda equina 10/23/2014 For stabilization of his lumbar spine, at which time a diagnosis will be obtained. He may also need radiation to the bone lesions, In addition to chemotherapy. We will await for the pathology results. Patient is interested in following up and with treatment at the [facility] for geographic convenience.  In the interim, agree with CT of the chest to further delineate the lung mass. We will follow the results, and further recommendations are to proceed.  COPD History of tobacco habituation Tobacco cessation is recommended. Continue supportive care for his COPD  Malnutrition Recommend nutrition evaluation while in the hospital  DVT prophylaxis On SCDs in preparation for surgery  Full code   Other medical issues as per admitting team.  Rondel Jumbo, PA-C 10/22/2014 2:01 PM  Attending Note  I personally saw the patient, reviewed the chart and examined the patient. The plan of care was discussed with the patient and the admitting team. I agree with the assessment and plan as documented above. Thank you very much for the consultation.  1. Pathological fracture L3 with neurological changes with pain and weakness in lower extremities: Neurosurgery plans to do decompression tomorrow. 2. Lung mass with hilar adenopathy: suspicious for stage 4 lung cancer. Will await path report regarding histology. If it is adenocarcinoma, he might need additional biopsies of the lung mass to send for [employer] One testing since Bone lesions cannot be sent for molecular diagnostics (alternately, blood may be sent for EGFR testing if its adeno)  3. With the patient being a heavy smoker, I think the likelihood of EGFR or ALK mutations is very small. If it is a small cell  lung cancer, he might need urgent therapy. 4. Bone mets: Will need to see Rad-Onc and  [facility]om[facility] for bisphosphonate therapy 5. Cutaneous Squamous cell cancer of the face s/p surgery in May 2016 at [facility] 6. Tobacco Abuse: discussed cessation 7. Prognosis: I discussed that Stage 4 lung cancer cannot be cured but can be treated with the goal of prolonging his life. He was initially of the opinion that if it cannot be cured, he might let it be but later after explaining that if nothing is done, his survival could be a matter of very few months, he was willing to consider treatment options.  Await path report before deciding on the plan of care. We will need PET-CT as outpatient. Since he is from [place], upon discharge, he could see our partner Dr.Penland Thanks

## 2014-10-22 NOTE — Progress Notes (Signed)
Pt admitted from ED moved from stretcher VSS no complaints of pain. Introduced self and explained unit routine. Patient in no acute distress.

## 2014-10-22 NOTE — Progress Notes (Signed)
Due to OR scheduling and limited equipment availability case will be delayed until 0830 tomorrow morning.  Discussed with patient, he agrees.

## 2014-10-22 NOTE — Progress Notes (Addendum)
Patient Demographics  Edward Duran, is a 59 y.o. male, DOB - 1955-11-25, DPO:[id number]  Admit date - 10/21/2014   Admitting Physician Allyne Gee, MD  Outpatient Primary MD for the patient is No PCP Per Patient  LOS - 1   Chief Complaint  Patient presents with  . Fall  . Altered Mental Status       Admission HPI/Brief narrative: 47-year presents with lower back pain, presents for evaluation for lower symmetry weakness, MRI showing evidence of pathologic L3 vertebral fracture with mass, as well as evidence of right lower lobe lung mass suspicious for lung cancer, patient was seen by neurosurgery condition for L3 decompression ,corpectomy .  Subjective:   Edward Duran today has, No headache, No chest pain, No abdominal pain - No Nausea, No Cough - SOB.  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 Lindi Adie who will evaluate the patient . - Regarding lumbar spine metastasis ,patient was seen by neurosurgery, plan is for patient to go to OR tomorrow morning for L3 decompression, corpectomy, L1-5 dorsal internal fixation and fusion, especially with anticipation of survivor more than 3 months, and need for chemotherapy and radiation, with need for stabilization of his lumbar spine, as well biopsy will be obtained during neurosurgical intervention, this was discussed with oncology and neurosurgery. - CT chest with contrast pending for further evaluation of lung/hilar lung mass - Patient  will need to go to neuro ICU postoperatively, D/W PCCM.  Hypertension - Blood pressure acceptable, continue to monitor off medication  COPD - No active wheezing, continue with nebs perioperatively - Continue with Symbicort  AKI - We'll hold hydrochlorothiazide and Benicar - Continue with IV fluids  Hyperlipidemia - Continue with Vytorin   Peripheral vascular disease (Evidence of extensive aortoiliac disease on CT abdomen and pelvis with partial thrombosis) - appears to be chronic , as well having good collaterals and reconstitution of blood flow in the right femoral artery, patient with no evidence of ischemia and lower extremity.   Code Status:  Full  Family Communication: Son at bedside  Disposition Plan: Pending further workup   Procedures  None   Consults   Neurosurgery Oncology   Medications  Scheduled Meds: . sodium chloride   Intravenous Once  . budesonide-formoterol  2 puff Inhalation BID  . docusate sodium  100 mg Oral BID  . ezetimibe-simvastatin  1 tablet Oral Daily  . folic acid  1 mg Oral Daily  . irbesartan  150 mg Oral Daily   And  . hydrochlorothiazide  12.5 mg Oral Daily  . multivitamin with minerals  1 tablet Oral Daily  . pantoprazole  40 mg Oral BID  . sodium chloride  3 mL Intravenous Q12H  . thiamine  100 mg Oral Daily   Continuous Infusions: . sodium chloride 50 mL/hr at  10/21/14 2242   PRN Meds:.acetaminophen **OR** acetaminophen, iohexol, ipratropium-albuterol, morphine injection, ondansetron **OR** ondansetron (ZOFRAN) IV, oxyCODONE, polyethylene glycol  DVT Prophylaxis  SCD , will start chemical DVT prophylaxis after surgery  Lab Results  Component Value Date   PLT 208 10/22/2014    Antibiotics    Anti-infectives    None          Objective:   Filed Vitals:   10/22/14 0357 10/22/14 0710 10/22/14 0749 10/22/14 0756  BP: 119/68 131/61    Pulse: 86 63    Temp:    97.7 F (36.5 C)  TempSrc:    Oral  Resp: 18 11     Height:      Weight:      SpO2: 94% 96% 96%     Wt Readings from Last 3 Encounters:  10/21/14 51 kg (112 lb 7 oz)  09/10/14 58.968 kg (130 lb)     Intake/Output Summary (Last 24 hours) at 10/22/14 1127 Last data filed at 10/22/14 0846  Gross per 24 hour  Intake    550 ml  Output    830 ml  Net   -280 ml     Physical Exam  Awake Alert, Oriented X 3, No new F.N deficits, Normal affect [place].AT,PERRAL Supple Neck,No JVD,  Symmetrical Chest wall movement, diminished air entry in the right lung  RRR,No Gallops,Rubs or new Murmurs, No Parasternal Heave +ve B.Sounds, Abd Soft, No tenderness, No organomegaly appriciated,  No Cyanosis, Clubbing or edema, No new Rash or bruise     Data Review   Micro Results Recent Results (from the past 240 hour(s))  MRSA PCR Screening     Status: None   Collection Time: 10/22/14  6:04 AM  Result Value Ref Range Status   MRSA by PCR NEGATIVE NEGATIVE Final    Comment:        The GeneXpert MRSA Assay (FDA approved for NASAL specimens only), is one component of a comprehensive MRSA colonization surveillance program. It is not intended to diagnose MRSA infection nor to guide or monitor treatment for MRSA infections.     Radiology Reports Ct Head W Wo Contrast  10/21/2014   EXAM: CT HEAD WITHOUT AND WITH CONTRAST  TECHNIQUE: Contiguous axial images were obtained from the base of the skull through the vertex without and with intravenous contrast  CONTRAST:  171m OMNIPAQUE IOHEXOL 300 MG/ML  SOLN  COMPARISON:  None.  FINDINGS: Encephalomalacia within the left frontal and parietal lobes, consistent with remote left MCA territory infarct. Scattered intracranial atherosclerotic cyst noted.  No acute large vessel territory infarct. No intracranial hemorrhage. No mass lesion, midline shift, or mass effect. No hydrocephalus. No abnormal enhancement.  Scalp soft tissues within normal limits. No acute abnormality about the orbits.  Paranasal sinuses  and mastoid air cells are clear.  Calvarium intact.  No focal osseous lesion.  IMPRESSION: 1. No acute intracranial process.  No abnormal enhancement. 2. Remote left MCA territory infarct.   Electronically Signed   By: BJeannine BogaM.D.   On: 10/21/2014 23:25   Mr Thoracic Spine W Wo Contrast  10/21/2014   CLINICAL DATA:  Back pain.  Recent falls.  EXAM: MRI THORACIC AND LUMBAR SPINE WITHOUT AND WITH CONTRAST  TECHNIQUE: Multiplanar and multiecho pulse sequences of the thoracic and lumbar spine were obtained without and with intravenous contrast.  CONTRAST:  161mMULTIHANCE GADOBENATE DIMEGLUMINE 529 MG/ML IV SOLN  COMPARISON:  Lumbar radiographs 09/10/2014. No prior MRI for comparison.  FINDINGS:  MR THORACIC SPINE FINDINGS  Negative for thoracic spine fracture. 1 cm lesion in the T10 vertebral body is low signal on T1 and T2 but does not enhance. Given the findings lumbar spine, this may represent metastatic disease however could also be a benign lesion such as hemangioma. No other worrisome lesions in the thoracic spine.  No cord compression.  Spinal cord signal is normal.  No significant disc degeneration or disc protrusion  No enhancing lesions are seen postcontrast.  Right lower lobe lesion posteriorly measuring approximately 2 x 2.5 cm. There is right hilar adenopathy. Findings are worrisome for carcinoma of the lung. Chest CT recommended.  MR LUMBAR SPINE FINDINGS  Moderate levoscoliosis.  Distended urinary bladder.  Pathologic fracture of L2. There is extensive tumor throughout the L3 vertebral body extending to the right transverse process and posterior elements on the right. There is extensive tumor within the canal and right foramen at L3-4. Severe spinal stenosis with compression of the thecal sac due to tumor. This is likely the cause of the distended urinary bladder.  Conus medullaris normal. No other fracture or metastatic disease identified in the lumbar spine.  Moderate spinal stenosis  L4-5 due to disc and facet degeneration.  IMPRESSION: Findings worrisome for lung cancer with right lower lobe density and right hilar adenopathy. CT chest with contrast recommended for further evaluation.  1 cm nonenhancing lesion T10 vertebral body, worrisome for metastatic disease. No thoracic cord compression or fracture.  Pathologic fracture of L3. Tumor extends into the posterior elements and transverse process on the right. There is extensive tumor in the canal causing severe spinal stenosis. Distended urinary bladder likely due to severe spinal stenosis and cauda equina syndrome.  Moderate spinal stenosis L4-5 due to degenerative change.  Critical Value/emergent results were called by telephone at the time of interpretation on 10/21/2014 at 6:54 pm to Dr. Tanna Furry , who verbally acknowledged these results.   Electronically Signed   By: Franchot Gallo M.D.   On: 10/21/2014 18:56   Mr Lumbar Spine W Wo Contrast  10/21/2014   CLINICAL DATA:  Back pain.  Recent falls.  EXAM: MRI THORACIC AND LUMBAR SPINE WITHOUT AND WITH CONTRAST  TECHNIQUE: Multiplanar and multiecho pulse sequences of the thoracic and lumbar spine were obtained without and with intravenous contrast.  CONTRAST:  36m MULTIHANCE GADOBENATE DIMEGLUMINE 529 MG/ML IV SOLN  COMPARISON:  Lumbar radiographs 09/10/2014. No prior MRI for comparison.  FINDINGS: MR THORACIC SPINE FINDINGS  Negative for thoracic spine fracture. 1 cm lesion in the T10 vertebral body is low signal on T1 and T2 but does not enhance. Given the findings lumbar spine, this may represent metastatic disease however could also be a benign lesion such as hemangioma. No other worrisome lesions in the thoracic spine.  No cord compression.  Spinal cord signal is normal.  No significant disc degeneration or disc protrusion  No enhancing lesions are seen postcontrast.  Right lower lobe lesion posteriorly measuring approximately 2 x 2.5 cm. There is right hilar adenopathy. Findings are  worrisome for carcinoma of the lung. Chest CT recommended.  MR LUMBAR SPINE FINDINGS  Moderate levoscoliosis.  Distended urinary bladder.  Pathologic fracture of L2. There is extensive tumor throughout the L3 vertebral body extending to the right transverse process and posterior elements on the right. There is extensive tumor within the canal and right foramen at L3-4. Severe spinal stenosis with compression of the thecal sac due to tumor. This is likely the cause of the  distended urinary bladder.  Conus medullaris normal. No other fracture or metastatic disease identified in the lumbar spine.  Moderate spinal stenosis L4-5 due to disc and facet degeneration.  IMPRESSION: Findings worrisome for lung cancer with right lower lobe density and right hilar adenopathy. CT chest with contrast recommended for further evaluation.  1 cm nonenhancing lesion T10 vertebral body, worrisome for metastatic disease. No thoracic cord compression or fracture.  Pathologic fracture of L3. Tumor extends into the posterior elements and transverse process on the right. There is extensive tumor in the canal causing severe spinal stenosis. Distended urinary bladder likely due to severe spinal stenosis and cauda equina syndrome.  Moderate spinal stenosis L4-5 due to degenerative change.  Critical Value/emergent results were called by telephone at the time of interpretation on 10/21/2014 at 6:54 pm to Dr. Tanna Furry , who verbally acknowledged these results.   Electronically Signed   By: Franchot Gallo M.D.   On: 10/21/2014 18:56   Ct Abdomen Pelvis W Contrast  10/21/2014   CLINICAL DATA:  59 year old male with chest mass presenting with recent fall. Earlier spine MRI demonstrated a mass with pathological fracture involving L3 vertebra.  EXAM: CT ABDOMEN AND PELVIS WITH CONTRAST  TECHNIQUE: Multidetector CT imaging of the abdomen and pelvis was performed using the standard protocol following bolus administration of intravenous contrast.   CONTRAST:  172m OMNIPAQUE IOHEXOL 300 MG/ML  SOLN  COMPARISON:  Spine MRI dated 10/21/2014  FINDINGS: There is consolidative changes of the visualized right middle lobe. Endobronchial material noted within the visualized right lower lobe bronchi. These findings may be sequela of central obstruction caused by known lung mass. No intra-abdominal free air or free fluid identified.  Small scattered hepatic calcifications noted, likely sequela of prior insult or infection. There are multiple stones within the gallbladder. No associated inflammatory changes or CT evidence of acute cholecystitis. The pancreas appears unremarkable. Multiple small calcified splenic granuloma noted. The adrenal glands appear unremarkable. There is moderate right renal atrophy. There bilateral renal vascular calcification. There is no hydronephrosis on either side. Two multiple bilateral renal hypodense lesions most of which are too small to characterize the largest lesion is located in the inferior pole of the left kidney and measures 1.6 cm of and demonstrates the fluid attenuation most likely a cyst. Ultrasound may provide better characterization. The visualized ureters and urinary bladder appear unremarkable. The prostate gland is grossly unremarkable.  Oral contrast opacifies the stomach and multiple loops of small bowel. There is no evidence of bowel obstruction or inflammation. The appendix is not visualized with certainty. No inflammatory changes identified in the right lower quadrant.  The abdominal aorta is tortuous. There is aortoiliac atherosclerotic disease. There is a 3 cm partially thrombosed infrarenal abdominal aortic aneurysm. There is thrombosis of the right common iliac artery as well as thrombosis of the right internal and external iliac arteries. There is reconstitution of the flow within the right femoral artery. The origins of the celiac axis, SMA appear patent. There are atherosclerotic calcifications of the renal  artery ostia. No portal venous gas identified. There is no lymphadenopathy.  There is a 5.4 x 7.2 cm soft tissue mass involving the vertebral body, right pedicle, right transverse process and right posterior elements of the L3 vertebra. There is destructive changes and loss of vertebral body height and complete collapse of the right side of the L3 vertebra. There is associated lumbar levoscoliosis. There is extension of the mass into the central canal and neural foramina  at L3. There is focal compression and narrowing of the thecal sac. These findings are better evaluated on the MRI.  IMPRESSION: Complete collapse of the visualized right middle lobe likely related to central obstruction by the known lung mass.  Cholelithiasis.  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 There is reconstitution of the flow in the right femoral artery. These findings appear to be chronic.  Large destructive soft tissue mass involving the L3 vertebral as seen on the earlier MRI and compatible with malignancy. There is associated pathologic fracture and complete collapse of the right side of the L3 vertebra.   Electronically Signed   By: Anner Crete M.D.   On: 10/21/2014 22:48     CBC  Recent Labs Lab 10/21/14 1130 10/22/14 0330  WBC 11.2* 5.5  HGB 15.3 14.5  HCT 44.2 40.9  PLT 238 208  MCV 93.6 93.2  MCH 32.4 33.0  MCHC 34.6 35.5  RDW 13.8 14.0  LYMPHSABS 1.6  --   MONOABS 0.8  --   EOSABS 0.6  --   BASOSABS 0.1  --     Chemistries   Recent Labs Lab 10/21/14 1130 10/21/14 2255 10/22/14 0330  NA 136  --  135  K 4.0  --  5.3*  CL 100*  --  101  CO2 27  --  24  GLUCOSE 110*  --  139*  BUN 22*  --  19  CREATININE 1.40*  --  1.52*  CALCIUM 9.6 9.3 9.6  MG  --  1.8  --   AST  --  22 22  ALT  --  20 20  ALKPHOS  --  132* 131*  BILITOT  --  0.5 0.2*    ------------------------------------------------------------------------------------------------------------------ estimated creatinine clearance is 37.7 mL/min (by C-G formula based on Cr of 1.52). ------------------------------------------------------------------------------------------------------------------ No results for input(s): HGBA1C in the last 72 hours. ------------------------------------------------------------------------------------------------------------------ No results for input(s): CHOL, HDL, LDLCALC, TRIG, CHOLHDL, LDLDIRECT in the last 72 hours. ------------------------------------------------------------------------------------------------------------------  Recent Labs  10/21/14 2255  TSH 0.984   ------------------------------------------------------------------------------------------------------------------ No results for input(s): VITAMINB12, FOLATE, FERRITIN, TIBC, IRON, RETICCTPCT in the last 72 hours.  Coagulation profile  Recent Labs Lab 10/22/14 0330  INR 1.06    No results for input(s): DDIMER in the last 72 hours.  Cardiac Enzymes No results for input(s): CKMB, TROPONINI, MYOGLOBIN in the last 168 hours.  Invalid input(s): CK ------------------------------------------------------------------------------------------------------------------ Invalid input(s): POCBNP     Time Spent in minutes   35 minutes    Fareeda Downard M.D on 10/22/2014 at 11:27 AM  Between 7am to 7pm - Pager - [phone]  After 7pm go to [email or web address] - password [facility]  [facility]   Office  [phone]

## 2014-10-22 NOTE — Progress Notes (Signed)
Initial Nutrition Assessment  DOCUMENTATION CODES:   Severe malnutrition in context of chronic illness, Underweight  INTERVENTION:    Boost Plus PO TID, each supplement provides 360 kcal and 14 gm protein  NUTRITION DIAGNOSIS:   Malnutrition related to chronic illness as evidenced by severe depletion of body fat, severe depletion of muscle mass, percent weight loss (14%weight loss within 3 months).  GOAL:   Patient will meet greater than or equal to 90% of their needs  MONITOR:   PO intake, Supplement acceptance, Labs, Weight trends  REASON FOR ASSESSMENT:   Malnutrition Screening Tool    ASSESSMENT:   Patient admitted on 9/5 with pathological fracture of lumbar vertebra due to neoplastic disease.   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 Neurosurgical consultation has been obtained, and the patient is to undergo L3 decompression, dorsal internal fixation and fusion from L1-L5 and decompression of his cauda equina 10/23/2014, at which time a diagnosis will be obtained.  Labs reviewed: potassium and creatinine elevated  Patient reports good appetite PTA. Usually eats 3 meals and a snack every day. Recently has lost a lot of weight, despite good intake. 14% weight loss within the past 3 months. He has drank Ensure and Boost supplements, but does not like them very much. He agreed to drink chocolate Boost Plus supplements between meals to maximize calorie and protein intake. Nutrition-focused physical exam completed. Findings are severe fat depletion, severe muscle depletion, and no edema.    Diet Order:  Diet regular Room service appropriate?: Yes; Fluid consistency:: Thin Diet NPO time specified Except for: Sips with Meds  Skin:   Reviewed, no issues  Last BM:  unknown  Height:   Ht Readings from Last 1 Encounters:  10/21/14 '5\' 6"'$  (1.676 m)    Weight:   Wt Readings from Last 1 Encounters:  10/21/14 112 lb 7 oz (51 kg)    Ideal Body Weight:  64.5 kg  BMI:  Body mass index is 18.16 kg/(m^2).  Estimated Nutritional Needs:   Kcal:  1650-1850  Protein:  80-90 gm  Fluid:  1.8 L  EDUCATION NEEDS:   No education needs identified at this time  Molli Barrows, [place], [place], [place] Pager [phone] After Hours Pager [phone]

## 2014-10-22 NOTE — Progress Notes (Signed)
Hypoglycemic Event  CBG: 69  Treatment: D50 IV 25 mL  Symptoms: Nervous/irritable  Follow-up CBG: Time:0026 CBG Result:125  Possible Reasons for Event: Unknown  Comments/MD notified:yes    Kathlen Mody, Conni Elliot  Remember to initiate Hypoglycemia Order Set & complete

## 2014-10-23 ENCOUNTER — Encounter (HOSPITAL_COMMUNITY)
Admission: EM | Disposition: A | Discharge status: Rehabilitation Facility | Payer: Self-pay | Source: Home / Self Care | Attending: Pulmonary Disease

## 2014-10-23 ENCOUNTER — Inpatient Hospital Stay (HOSPITAL_COMMUNITY): Payer: Medicaid Other | Admitting: Anesthesiology

## 2014-10-23 ENCOUNTER — Encounter (HOSPITAL_COMMUNITY): Payer: Self-pay | Admitting: Certified Registered Nurse Anesthetist

## 2014-10-23 ENCOUNTER — Inpatient Hospital Stay (HOSPITAL_COMMUNITY): Payer: Medicaid Other

## 2014-10-23 DIAGNOSIS — C7951 Secondary malignant neoplasm of bone: Secondary | ICD-10-CM | POA: Diagnosis present

## 2014-10-23 DIAGNOSIS — R579 Shock, unspecified: Secondary | ICD-10-CM

## 2014-10-23 DIAGNOSIS — J95821 Acute postprocedural respiratory failure: Secondary | ICD-10-CM

## 2014-10-23 LAB — BLOOD GAS, ARTERIAL
ACID-BASE DEFICIT: 3.3 mmol/L — AB (ref 0.0–2.0)
Bicarbonate: 20.9 mEq/L (ref 20.0–24.0)
DRAWN BY: 398981
FIO2: 0.3
LHR: 12 {breaths}/min
MECHVT: 510 mL
O2 Saturation: 99 %
PATIENT TEMPERATURE: 98.6
PCO2 ART: 35.8 mmHg (ref 35.0–45.0)
PEEP/CPAP: 5 cmH2O
PH ART: 7.385 (ref 7.350–7.450)
PO2 ART: 127 mmHg — AB (ref 80.0–100.0)
TCO2: 22 mmol/L (ref 0–100)

## 2014-10-23 LAB — PREPARE RBC (CROSSMATCH)

## 2014-10-23 LAB — POCT I-STAT 7, (LYTES, BLD GAS, ICA,H+H)
ACID-BASE DEFICIT: 3 mmol/L — AB (ref 0.0–2.0)
ACID-BASE DEFICIT: 4 mmol/L — AB (ref 0.0–2.0)
Acid-base deficit: 3 mmol/L — ABNORMAL HIGH (ref 0.0–2.0)
Acid-base deficit: 2 mmol/L (ref 0.0–2.0)
Acid-base deficit: 3 mmol/L — ABNORMAL HIGH (ref 0.0–2.0)
BICARBONATE: 21.5 meq/L (ref 20.0–24.0)
BICARBONATE: 23.4 meq/L (ref 20.0–24.0)
Bicarbonate: 23.9 mEq/L (ref 20.0–24.0)
Bicarbonate: 23.6 mEq/L (ref 20.0–24.0)
Bicarbonate: 22.8 mEq/L (ref 20.0–24.0)
CALCIUM ION: 1.18 mmol/L (ref 1.12–1.23)
CALCIUM ION: 1.2 mmol/L (ref 1.12–1.23)
Calcium, Ion: 1.31 mmol/L — ABNORMAL HIGH (ref 1.12–1.23)
Calcium, Ion: 1.2 mmol/L (ref 1.12–1.23)
Calcium, Ion: 1.16 mmol/L (ref 1.12–1.23)
HCT: 33 % — ABNORMAL LOW (ref 39.0–52.0)
HCT: 27 % — ABNORMAL LOW (ref 39.0–52.0)
HCT: 27 % — ABNORMAL LOW (ref 39.0–52.0)
HCT: 28 % — ABNORMAL LOW (ref 39.0–52.0)
HCT: 34 % — ABNORMAL LOW (ref 39.0–52.0)
HEMOGLOBIN: 11.2 g/dL — AB (ref 13.0–17.0)
HEMOGLOBIN: 9.2 g/dL — AB (ref 13.0–17.0)
Hemoglobin: 9.2 g/dL — ABNORMAL LOW (ref 13.0–17.0)
Hemoglobin: 9.5 g/dL — ABNORMAL LOW (ref 13.0–17.0)
Hemoglobin: 11.6 g/dL — ABNORMAL LOW (ref 13.0–17.0)
O2 SAT: 100 %
O2 SAT: 100 %
O2 Saturation: 100 %
O2 Saturation: 100 %
O2 Saturation: 100 %
PCO2 ART: 49.5 mmHg — AB (ref 35.0–45.0)
PCO2 ART: 41.3 mmHg (ref 35.0–45.0)
PCO2 ART: 44 mmHg (ref 35.0–45.0)
PCO2 ART: 49.1 mmHg — AB (ref 35.0–45.0)
PH ART: 7.325 — AB (ref 7.350–7.450)
PH ART: 7.312 — AB (ref 7.350–7.450)
PO2 ART: 196 mmHg — AB (ref 80.0–100.0)
PO2 ART: 229 mmHg — AB (ref 80.0–100.0)
PO2 ART: 228 mmHg — AB (ref 80.0–100.0)
PO2 ART: 376 mmHg — AB (ref 80.0–100.0)
POTASSIUM: 4.2 mmol/L (ref 3.5–5.1)
Patient temperature: 36.9
Patient temperature: 37
Potassium: 3.8 mmol/L (ref 3.5–5.1)
Potassium: 4.1 mmol/L (ref 3.5–5.1)
Potassium: 4.6 mmol/L (ref 3.5–5.1)
Potassium: 4.8 mmol/L (ref 3.5–5.1)
SODIUM: 138 mmol/L (ref 135–145)
Sodium: 137 mmol/L (ref 135–145)
Sodium: 137 mmol/L (ref 135–145)
Sodium: 139 mmol/L (ref 135–145)
Sodium: 140 mmol/L (ref 135–145)
TCO2: 25 mmol/L (ref 0–100)
TCO2: 25 mmol/L (ref 0–100)
TCO2: 24 mmol/L (ref 0–100)
TCO2: 23 mmol/L (ref 0–100)
TCO2: 25 mmol/L (ref 0–100)
pCO2 arterial: 42.8 mmHg (ref 35.0–45.0)
pH, Arterial: 7.289 — ABNORMAL LOW (ref 7.350–7.450)
pH, Arterial: 7.364 (ref 7.350–7.450)
pH, Arterial: 7.286 — ABNORMAL LOW (ref 7.350–7.450)
pO2, Arterial: 240 mmHg — ABNORMAL HIGH (ref 80.0–100.0)

## 2014-10-23 LAB — HEPATIC FUNCTION PANEL
ALBUMIN: 2.9 g/dL — AB (ref 3.5–5.0)
ALT: 13 U/L — AB (ref 17–63)
AST: 29 U/L (ref 15–41)
Alkaline Phosphatase: 46 U/L (ref 38–126)
BILIRUBIN DIRECT: 0.5 mg/dL (ref 0.1–0.5)
BILIRUBIN TOTAL: 1.7 mg/dL — AB (ref 0.3–1.2)
Indirect Bilirubin: 1.2 mg/dL — ABNORMAL HIGH (ref 0.3–0.9)
Total Protein: 4.2 g/dL — ABNORMAL LOW (ref 6.5–8.1)

## 2014-10-23 LAB — MAGNESIUM: MAGNESIUM: 1.3 mg/dL — AB (ref 1.7–2.4)

## 2014-10-23 LAB — CBC
HEMATOCRIT: 33.1 % — AB (ref 39.0–52.0)
HEMATOCRIT: 33.3 % — AB (ref 39.0–52.0)
HEMATOCRIT: 34.2 % — AB (ref 39.0–52.0)
HEMOGLOBIN: 11.6 g/dL — AB (ref 13.0–17.0)
HEMOGLOBIN: 11.8 g/dL — AB (ref 13.0–17.0)
Hemoglobin: 11.3 g/dL — ABNORMAL LOW (ref 13.0–17.0)
MCH: 32.4 pg (ref 26.0–34.0)
MCH: 28.9 pg (ref 26.0–34.0)
MCH: 29 pg (ref 26.0–34.0)
MCHC: 35 g/dL (ref 30.0–36.0)
MCHC: 33.9 g/dL (ref 30.0–36.0)
MCHC: 34.5 g/dL (ref 30.0–36.0)
MCV: 92.5 fL (ref 78.0–100.0)
MCV: 85.2 fL (ref 78.0–100.0)
MCV: 84 fL (ref 78.0–100.0)
PLATELETS: 62 10*3/uL — AB (ref 150–400)
Platelets: 189 10*3/uL (ref 150–400)
Platelets: 59 10*3/uL — ABNORMAL LOW (ref 150–400)
RBC: 3.58 MIL/uL — ABNORMAL LOW (ref 4.22–5.81)
RBC: 3.91 MIL/uL — ABNORMAL LOW (ref 4.22–5.81)
RBC: 4.07 MIL/uL — AB (ref 4.22–5.81)
RDW: 14.1 % (ref 11.5–15.5)
RDW: 15.6 % — AB (ref 11.5–15.5)
RDW: 15.8 % — ABNORMAL HIGH (ref 11.5–15.5)
WBC: 11.5 10*3/uL — ABNORMAL HIGH (ref 4.0–10.5)
WBC: 8.3 10*3/uL (ref 4.0–10.5)
WBC: 7.5 10*3/uL (ref 4.0–10.5)

## 2014-10-23 LAB — BASIC METABOLIC PANEL
Anion gap: 9 (ref 5–15)
Anion gap: 3 — ABNORMAL LOW (ref 5–15)
BUN: 29 mg/dL — ABNORMAL HIGH (ref 6–20)
BUN: 15 mg/dL (ref 6–20)
CHLORIDE: 102 mmol/L (ref 101–111)
CHLORIDE: 112 mmol/L — AB (ref 101–111)
CO2: 23 mmol/L (ref 22–32)
CO2: 24 mmol/L (ref 22–32)
CREATININE: 1.56 mg/dL — AB (ref 0.61–1.24)
CREATININE: 1.08 mg/dL (ref 0.61–1.24)
Calcium: 8.5 mg/dL — ABNORMAL LOW (ref 8.9–10.3)
Calcium: 7.9 mg/dL — ABNORMAL LOW (ref 8.9–10.3)
GFR calc Af Amer: >60 mL/min (ref >60)
GFR calc non Af Amer: 47 mL/min — ABNORMAL LOW (ref >60)
GFR calc non Af Amer: >60 mL/min (ref >60)
GFR, EST AFRICAN AMERICAN: 54 mL/min — AB (ref >60)
Glucose, Bld: 127 mg/dL — ABNORMAL HIGH (ref 65–99)
Glucose, Bld: 132 mg/dL — ABNORMAL HIGH (ref 65–99)
POTASSIUM: 4.3 mmol/L (ref 3.5–5.1)
Potassium: 3.4 mmol/L — ABNORMAL LOW (ref 3.5–5.1)
Sodium: 134 mmol/L — ABNORMAL LOW (ref 135–145)
Sodium: 139 mmol/L (ref 135–145)

## 2014-10-23 LAB — CK TOTAL AND CKMB (NOT AT ARMC)
CK, MB: 16.7 ng/mL — AB (ref 0.5–5.0)
RELATIVE INDEX: 1.6 (ref 0.0–2.5)
Total CK: 1058 U/L — ABNORMAL HIGH (ref 49–397)

## 2014-10-23 LAB — POCT I-STAT 4, (NA,K, GLUC, HGB,HCT)
Glucose, Bld: 116 mg/dL — ABNORMAL HIGH (ref 65–99)
Glucose, Bld: 119 mg/dL — ABNORMAL HIGH (ref 65–99)
HCT: 30 % — ABNORMAL LOW (ref 39.0–52.0)
HEMATOCRIT: 26 % — AB (ref 39.0–52.0)
HEMOGLOBIN: 8.8 g/dL — AB (ref 13.0–17.0)
Hemoglobin: 10.2 g/dL — ABNORMAL LOW (ref 13.0–17.0)
POTASSIUM: 3.4 mmol/L — AB (ref 3.5–5.1)
POTASSIUM: 3.7 mmol/L (ref 3.5–5.1)
SODIUM: 138 mmol/L (ref 135–145)
SODIUM: 138 mmol/L (ref 135–145)

## 2014-10-23 LAB — GLUCOSE, CAPILLARY: Glucose-Capillary: 105 mg/dL — ABNORMAL HIGH (ref 65–99)

## 2014-10-23 LAB — PROTIME-INR
INR: 1.48 (ref 0.00–1.49)
INR: 1.31 (ref 0.00–1.49)
PROTHROMBIN TIME: 18 s — AB (ref 11.6–15.2)
Prothrombin Time: 16.4 seconds — ABNORMAL HIGH (ref 11.6–15.2)

## 2014-10-23 LAB — TROPONIN I: Troponin I: 0.03 ng/mL (ref <0.031)

## 2014-10-23 LAB — PHOSPHORUS: Phosphorus: 2.9 mg/dL (ref 2.5–4.6)

## 2014-10-23 LAB — HEMOGLOBIN A1C
Hgb A1c MFr Bld: 5.9 % — ABNORMAL HIGH (ref 4.8–5.6)
MEAN PLASMA GLUCOSE: 123 mg/dL

## 2014-10-23 LAB — LACTIC ACID, PLASMA: Lactic Acid, Venous: 1.3 mmol/L (ref 0.5–2.0)

## 2014-10-23 SURGERY — POSTERIOR LUMBAR FUSION 2 LEVEL
Anesthesia: General | Site: Back

## 2014-10-23 MED ORDER — ANTISEPTIC ORAL RINSE SOLUTION (CORINZ)
7.0000 mL | Freq: Four times a day (QID) | OROMUCOSAL | Status: DC
  Administered 2014-10-24 (×2): 7 mL via OROMUCOSAL

## 2014-10-23 MED ORDER — SODIUM CHLORIDE 0.9 % IV SOLN: Freq: Once | INTRAVENOUS | Status: DC

## 2014-10-23 MED ORDER — MIDAZOLAM HCL 2 MG/2ML IJ SOLN
INTRAMUSCULAR | Status: AC
  Filled 2014-10-23: qty 4

## 2014-10-23 MED ORDER — LIDOCAINE HCL (CARDIAC) 20 MG/ML IV SOLN
INTRAVENOUS | Status: DC | PRN
  Administered 2014-10-23: 40 mg via INTRAVENOUS

## 2014-10-23 MED ORDER — 0.9 % SODIUM CHLORIDE (POUR BTL) OPTIME
TOPICAL | Status: DC | PRN
  Administered 2014-10-23: 1000 mL

## 2014-10-23 MED ORDER — SUCCINYLCHOLINE CHLORIDE 20 MG/ML IJ SOLN
INTRAMUSCULAR | Status: AC
  Filled 2014-10-23: qty 1

## 2014-10-23 MED ORDER — MIDAZOLAM HCL 5 MG/5ML IJ SOLN
INTRAMUSCULAR | Status: DC | PRN
  Administered 2014-10-23: 2 mg via INTRAVENOUS

## 2014-10-23 MED ORDER — LIDOCAINE-EPINEPHRINE 1 %-1:100000 IJ SOLN
INTRAMUSCULAR | Status: DC | PRN
  Administered 2014-10-23: 5 mL

## 2014-10-23 MED ORDER — VECURONIUM BROMIDE 10 MG IV SOLR
INTRAVENOUS | Status: AC
  Filled 2014-10-23: qty 10

## 2014-10-23 MED ORDER — CHLORHEXIDINE GLUCONATE 0.12% ORAL RINSE (MEDLINE KIT)
15.0000 mL | Freq: Two times a day (BID) | OROMUCOSAL | Status: DC
  Administered 2014-10-24: 15 mL via OROMUCOSAL

## 2014-10-23 MED ORDER — LIDOCAINE HCL (CARDIAC) 20 MG/ML IV SOLN
INTRAVENOUS | Status: AC
  Filled 2014-10-23: qty 5

## 2014-10-23 MED ORDER — BUPIVACAINE LIPOSOME 1.3 % IJ SUSP
INTRAMUSCULAR | Status: DC | PRN
  Administered 2014-10-23: 20 mL

## 2014-10-23 MED ORDER — FENTANYL CITRATE (PF) 100 MCG/2ML IJ SOLN
50.0000 ug | INTRAMUSCULAR | Status: DC | PRN
  Administered 2014-10-23 (×3): 100 ug via INTRAVENOUS
  Filled 2014-10-23 (×3): qty 2

## 2014-10-23 MED ORDER — SODIUM CHLORIDE 0.9 % IV SOLN: 10.0000 mL/h | Freq: Once | INTRAVENOUS | Status: DC

## 2014-10-23 MED ORDER — VANCOMYCIN HCL 1000 MG IV SOLR
INTRAVENOUS | Status: AC
  Filled 2014-10-23: qty 1000

## 2014-10-23 MED ORDER — CALCIUM CHLORIDE 10 % IV SOLN
INTRAVENOUS | Status: DC | PRN
  Administered 2014-10-23: 300 mg via INTRAVENOUS
  Administered 2014-10-23: 200 mg via INTRAVENOUS
  Administered 2014-10-23: 300 mg via INTRAVENOUS
  Administered 2014-10-23: 200 mg via INTRAVENOUS

## 2014-10-23 MED ORDER — LACTATED RINGERS IV SOLN
INTRAVENOUS | Status: DC | PRN
  Administered 2014-10-23 (×5): via INTRAVENOUS

## 2014-10-23 MED ORDER — ARTIFICIAL TEARS OP OINT
TOPICAL_OINTMENT | OPHTHALMIC | Status: DC | PRN
  Administered 2014-10-23: 1 via OPHTHALMIC

## 2014-10-23 MED ORDER — PHENYLEPHRINE HCL 10 MG/ML IJ SOLN
INTRAMUSCULAR | Status: AC
  Filled 2014-10-23: qty 1

## 2014-10-23 MED ORDER — DEXTROSE 5 % IV SOLN
30.0000 ug/min | INTRAVENOUS | Status: DC
  Administered 2014-10-23: 18 ug/min via INTRAVENOUS
  Administered 2014-10-24: 15 ug/min via INTRAVENOUS
  Administered 2014-10-24: 18 ug/min via INTRAVENOUS
  Filled 2014-10-23 (×2): qty 1

## 2014-10-23 MED ORDER — BUPIVACAINE HCL (PF) 0.25 % IJ SOLN
INTRAMUSCULAR | Status: DC | PRN
  Administered 2014-10-23: 5 mL

## 2014-10-23 MED ORDER — VECURONIUM BROMIDE 10 MG IV SOLR
INTRAVENOUS | Status: DC | PRN
  Administered 2014-10-23: 2 mg via INTRAVENOUS
  Administered 2014-10-23: 3 mg via INTRAVENOUS
  Administered 2014-10-23: 2 mg via INTRAVENOUS
  Administered 2014-10-23: 3 mg via INTRAVENOUS

## 2014-10-23 MED ORDER — MAGNESIUM SULFATE 4 GM/100ML IV SOLN
4.0000 g | Freq: Once | INTRAVENOUS | Status: AC
  Administered 2014-10-24: 4 g via INTRAVENOUS
  Filled 2014-10-23: qty 100

## 2014-10-23 MED ORDER — FENTANYL CITRATE (PF) 250 MCG/5ML IJ SOLN
INTRAMUSCULAR | Status: AC
  Filled 2014-10-23: qty 5

## 2014-10-23 MED ORDER — BUPIVACAINE LIPOSOME 1.3 % IJ SUSP
20.0000 mL | Freq: Once | INTRAMUSCULAR | Status: DC
  Filled 2014-10-23: qty 20

## 2014-10-23 MED ORDER — CALCIUM CHLORIDE 10 % IV SOLN
INTRAVENOUS | Status: AC
  Filled 2014-10-23: qty 10

## 2014-10-23 MED ORDER — EPHEDRINE SULFATE 50 MG/ML IJ SOLN
INTRAMUSCULAR | Status: AC
Start: 2014-10-23 — End: 2014-10-23
  Filled 2014-10-23: qty 1

## 2014-10-23 MED ORDER — HYDRALAZINE HCL 20 MG/ML IJ SOLN
10.0000 mg | INTRAMUSCULAR | Status: DC | PRN
Start: 2014-10-23 — End: 2014-10-28

## 2014-10-23 MED ORDER — PANTOPRAZOLE SODIUM 40 MG IV SOLR
40.0000 mg | INTRAVENOUS | Status: DC
  Administered 2014-10-24: 40 mg via INTRAVENOUS
  Filled 2014-10-23 (×2): qty 40

## 2014-10-23 MED ORDER — DEXTROSE 5 % IV SOLN
10.0000 mg | INTRAVENOUS | Status: DC | PRN
  Administered 2014-10-23: 50 ug/min via INTRAVENOUS

## 2014-10-23 MED ORDER — PHENYLEPHRINE HCL 10 MG/ML IJ SOLN
INTRAMUSCULAR | Status: DC | PRN
  Administered 2014-10-23: 80 ug via INTRAVENOUS
  Administered 2014-10-23: 40 ug via INTRAVENOUS
  Administered 2014-10-23 (×2): 80 ug via INTRAVENOUS

## 2014-10-23 MED ORDER — FENTANYL CITRATE (PF) 100 MCG/2ML IJ SOLN
INTRAMUSCULAR | Status: DC | PRN
  Administered 2014-10-23: 50 ug via INTRAVENOUS
  Administered 2014-10-23 (×2): 100 ug via INTRAVENOUS
  Administered 2014-10-23: 50 ug via INTRAVENOUS
  Administered 2014-10-23: 100 ug via INTRAVENOUS
  Administered 2014-10-23: 25 ug via INTRAVENOUS
  Administered 2014-10-23: 100 ug via INTRAVENOUS
  Administered 2014-10-23: 25 ug via INTRAVENOUS
  Administered 2014-10-23: 50 ug via INTRAVENOUS
  Administered 2014-10-23: 25 ug via INTRAVENOUS
  Administered 2014-10-23: 50 ug via INTRAVENOUS
  Administered 2014-10-23: 25 ug via INTRAVENOUS
  Administered 2014-10-23: 100 ug via INTRAVENOUS
  Administered 2014-10-23 (×2): 50 ug via INTRAVENOUS

## 2014-10-23 MED ORDER — DEXMEDETOMIDINE HCL IN NACL 200 MCG/50ML IV SOLN
INTRAVENOUS | Status: DC | PRN
  Administered 2014-10-23: .4 ug/kg/h via INTRAVENOUS

## 2014-10-23 MED ORDER — ARTIFICIAL TEARS OP OINT
TOPICAL_OINTMENT | OPHTHALMIC | Status: AC
  Filled 2014-10-23: qty 3.5

## 2014-10-23 MED ORDER — THROMBIN 20000 UNITS EX SOLR
CUTANEOUS | Status: DC | PRN
  Administered 2014-10-23: 10:00:00 via TOPICAL

## 2014-10-23 MED ORDER — STERILE WATER FOR INJECTION IJ SOLN
INTRAMUSCULAR | Status: AC
Start: 2014-10-23 — End: 2014-10-23
  Filled 2014-10-23: qty 20

## 2014-10-23 MED ORDER — VANCOMYCIN HCL 1000 MG IV SOLR
INTRAVENOUS | Status: DC | PRN
  Administered 2014-10-23: 1000 mg via TOPICAL
  Administered 2014-10-23: 1000 mg

## 2014-10-23 MED ORDER — ROCURONIUM BROMIDE 50 MG/5ML IV SOLN
INTRAVENOUS | Status: AC
  Filled 2014-10-23: qty 1

## 2014-10-23 MED ORDER — ALBUMIN HUMAN 5 % IV SOLN
INTRAVENOUS | Status: DC | PRN
  Administered 2014-10-23 (×4): via INTRAVENOUS

## 2014-10-23 MED ORDER — SODIUM CHLORIDE 0.9 % IV SOLN
INTRAVENOUS | Status: DC | PRN
  Administered 2014-10-23 (×2): via INTRAVENOUS

## 2014-10-23 MED ORDER — PHENYLEPHRINE 40 MCG/ML (10ML) SYRINGE FOR IV PUSH (FOR BLOOD PRESSURE SUPPORT)
PREFILLED_SYRINGE | INTRAVENOUS | Status: AC
  Filled 2014-10-23: qty 20

## 2014-10-23 MED ORDER — HYDROMORPHONE HCL 1 MG/ML IJ SOLN: 0.2500 mg | INTRAMUSCULAR | Status: DC | PRN

## 2014-10-23 MED ORDER — PROPOFOL 10 MG/ML IV BOLUS
INTRAVENOUS | Status: DC | PRN
  Administered 2014-10-23: 140 mg via INTRAVENOUS
  Administered 2014-10-23: 50 mg via INTRAVENOUS

## 2014-10-23 MED ORDER — ROCURONIUM BROMIDE 100 MG/10ML IV SOLN
INTRAVENOUS | Status: DC | PRN
  Administered 2014-10-23: 50 mg via INTRAVENOUS

## 2014-10-23 MED ORDER — ONDANSETRON HCL 4 MG/2ML IJ SOLN
INTRAMUSCULAR | Status: AC
  Filled 2014-10-23: qty 2

## 2014-10-23 MED ORDER — PROPOFOL 10 MG/ML IV BOLUS
INTRAVENOUS | Status: AC
  Filled 2014-10-23: qty 20

## 2014-10-23 MED ORDER — ALBUTEROL SULFATE (2.5 MG/3ML) 0.083% IN NEBU: 2.5000 mg | INHALATION_SOLUTION | RESPIRATORY_TRACT | Status: DC | PRN

## 2014-10-23 MED ORDER — SODIUM CHLORIDE 0.9 % IR SOLN
Status: DC | PRN
  Administered 2014-10-23 (×3)

## 2014-10-23 MED ORDER — PROMETHAZINE HCL 25 MG/ML IJ SOLN
6.2500 mg | INTRAMUSCULAR | Status: DC | PRN
Start: 2014-10-23 — End: 2014-10-24

## 2014-10-23 MED ORDER — SODIUM BICARBONATE 4.2 % IV SOLN
INTRAVENOUS | Status: DC | PRN
  Administered 2014-10-23: 50 meq via INTRAVENOUS

## 2014-10-23 MED ORDER — CEFAZOLIN SODIUM-DEXTROSE 2-3 GM-% IV SOLR
INTRAVENOUS | Status: DC | PRN
  Administered 2014-10-23 (×2): 2 g via INTRAVENOUS

## 2014-10-23 MED ORDER — THROMBIN 5000 UNITS EX SOLR
OROMUCOSAL | Status: DC | PRN
  Administered 2014-10-23 (×3): via TOPICAL

## 2014-10-23 SURGICAL SUPPLY — 88 items
BAG DECANTER FOR FLEXI CONT (MISCELLANEOUS) ×9 IMPLANT
BENZOIN TINCTURE PRP APPL 2/3 (GAUZE/BANDAGES/DRESSINGS) ×3 IMPLANT
BIT DRILL NEURO 2X3.1 SFT TUCH (MISCELLANEOUS) ×1 IMPLANT
BLADE CLIPPER SURG (BLADE) ×3 IMPLANT
BLADE SURG 11 STRL SS (BLADE) ×3 IMPLANT
BONE MATRIX OSTEOCEL PRO MED (Bone Implant) ×3 IMPLANT
BUR MATCHSTICK NEURO 3.0X3.8 (BURR) ×3 IMPLANT
BUR ROUND FLUTED 5 RND (BURR) ×2 IMPLANT
BUR ROUND FLUTED 5MM RND (BURR) ×1
CANISTER SUCT 3000ML PPV (MISCELLANEOUS) ×6 IMPLANT
CAP END 2 TI LOCK SCREW X-CORE (Screw) ×4 IMPLANT
CHLORAPREP W/TINT 26ML (MISCELLANEOUS) ×3 IMPLANT
CLOSURE WOUND 1/2 X4 (GAUZE/BANDAGES/DRESSINGS)
CONNECTOR RELINE 45-65 5.5 (Connector) ×3 IMPLANT
CONT SPEC 4OZ CLIKSEAL STRL BL (MISCELLANEOUS) ×12 IMPLANT
CORE XCORE TI 18X20-25MM AUTO (Spacer) ×3 IMPLANT
DECANTER SPIKE VIAL GLASS SM (MISCELLANEOUS) ×3 IMPLANT
DERMABOND ADHESIVE PROPEN (GAUZE/BANDAGES/DRESSINGS) ×2
DERMABOND ADVANCED (GAUZE/BANDAGES/DRESSINGS)
DERMABOND ADVANCED .7 DNX12 (GAUZE/BANDAGES/DRESSINGS) IMPLANT
DERMABOND ADVANCED .7 DNX6 (GAUZE/BANDAGES/DRESSINGS) ×1 IMPLANT
DIGITIZER BENDINI (MISCELLANEOUS) ×3 IMPLANT
DRAPE MICROSCOPE LEICA (MISCELLANEOUS) IMPLANT
DRAPE POUCH INSTRU U-SHP 10X18 (DRAPES) ×3 IMPLANT
DRAPE SURG 17X23 STRL (DRAPES) ×3 IMPLANT
DRILL NEURO 2X3.1 SOFT TOUCH (MISCELLANEOUS) ×3
DRSG TEGADERM 4X4.75 (GAUZE/BANDAGES/DRESSINGS) ×3 IMPLANT
ELECT REM PT RETURN 9FT ADLT (ELECTROSURGICAL) ×3
ELECTRODE REM PT RTRN 9FT ADLT (ELECTROSURGICAL) ×1 IMPLANT
ENDCAP XCORE TI RND 22MM-18 (Cap) ×6 IMPLANT
EVACUATOR 1/8 PVC DRAIN (DRAIN) ×3 IMPLANT
GAUZE SPONGE 4X4 12PLY STRL (GAUZE/BANDAGES/DRESSINGS) IMPLANT
GAUZE SPONGE 4X4 16PLY XRAY LF (GAUZE/BANDAGES/DRESSINGS) IMPLANT
GLOVE BIOGEL PI IND STRL 7.5 (GLOVE) ×3 IMPLANT
GLOVE BIOGEL PI IND STRL 8 (GLOVE) ×4 IMPLANT
GLOVE BIOGEL PI INDICATOR 7.5 (GLOVE) ×6
GLOVE BIOGEL PI INDICATOR 8 (GLOVE) ×8
GLOVE ECLIPSE 7.5 STRL STRAW (GLOVE) ×18 IMPLANT
GLOVE EXAM NITRILE LRG STRL (GLOVE) IMPLANT
GLOVE EXAM NITRILE MD LF STRL (GLOVE) IMPLANT
GLOVE EXAM NITRILE XL STR (GLOVE) IMPLANT
GLOVE EXAM NITRILE XS STR PU (GLOVE) IMPLANT
GLOVE SS BIOGEL STRL SZ 7 (GLOVE) ×4 IMPLANT
GLOVE SUPERSENSE BIOGEL SZ 7 (GLOVE) ×8
GLOVE SURG SS PI 7.5 STRL IVOR (GLOVE) IMPLANT
GOWN STRL REUS W/ TWL LRG LVL3 (GOWN DISPOSABLE) ×3 IMPLANT
GOWN STRL REUS W/ TWL XL LVL3 (GOWN DISPOSABLE) ×1 IMPLANT
GOWN STRL REUS W/TWL 2XL LVL3 (GOWN DISPOSABLE) ×6 IMPLANT
GOWN STRL REUS W/TWL LRG LVL3 (GOWN DISPOSABLE) ×6
GOWN STRL REUS W/TWL XL LVL3 (GOWN DISPOSABLE) ×2
HEMOSTAT POWDER KIT SURGIFOAM (HEMOSTASIS) ×9 IMPLANT
KIT BASIN OR (CUSTOM PROCEDURE TRAY) ×3 IMPLANT
KIT ROOM TURNOVER OR (KITS) ×3 IMPLANT
MIX DBX 20CC MTF (Putty) ×6 IMPLANT
NEEDLE HYPO 21X1.5 SAFETY (NEEDLE) ×3 IMPLANT
NEEDLE HYPO 25X1 1.5 SAFETY (NEEDLE) ×3 IMPLANT
NS IRRIG 1000ML POUR BTL (IV SOLUTION) ×3 IMPLANT
PACK LAMINECTOMY NEURO (CUSTOM PROCEDURE TRAY) ×3 IMPLANT
PACK UNIVERSAL I (CUSTOM PROCEDURE TRAY) ×3 IMPLANT
PAD ARMBOARD 7.5X6 YLW CONV (MISCELLANEOUS) ×9 IMPLANT
PATTIES SURGICAL .5X1.5 (GAUZE/BANDAGES/DRESSINGS) ×3 IMPLANT
PEDIGUARD CURV (INSTRUMENTS) ×3 IMPLANT
ROD RELINE MAS ST 5.5X300MM (Rod) ×3 IMPLANT
RUBBERBAND STERILE (MISCELLANEOUS) IMPLANT
SCREW LOCK RELINE 5.5 TULIP (Screw) ×24 IMPLANT
SCREW MAS RELINE 6.5X45 POLY (Screw) ×3 IMPLANT
SCREW MAS RELINE 6.5X50 POLY (Screw) ×6 IMPLANT
SCREW SET ENDCAP (Screw) ×8 IMPLANT
SCREW SHANK MAS MOD 6.5X50MM (Screw) ×9 IMPLANT
SCREW SHANK RELINE 6.5X45MM 2C (Screw) ×6 IMPLANT
SLEEVE SURGEON STRL (DRAPES) ×3 IMPLANT
SPINE TULIP RELINE MOD (Neuro Prosthesis/Implant) ×15 IMPLANT
SPONGE NEURO XRAY DETECT 1X3 (DISPOSABLE) ×3 IMPLANT
SPONGE SURGIFOAM ABS GEL 100 (HEMOSTASIS) ×3 IMPLANT
SPONGE SURGIFOAM ABS GEL SZ50 (HEMOSTASIS) IMPLANT
STRIP CLOSURE SKIN 1/2X4 (GAUZE/BANDAGES/DRESSINGS) IMPLANT
STRIP VITOSS 25X100X4MM (Neuro Prosthesis/Implant) ×3 IMPLANT
SUT VIC AB 0 CT1 18XCR BRD8 (SUTURE) ×2 IMPLANT
SUT VIC AB 0 CT1 8-18 (SUTURE) ×4
SUT VIC AB 2-0 CT1 18 (SUTURE) ×6 IMPLANT
SUT VIC AB 3-0 SH 8-18 (SUTURE) ×6 IMPLANT
SYR 20CC LL (SYRINGE) ×3 IMPLANT
TOWEL OR 17X24 6PK STRL BLUE (TOWEL DISPOSABLE) ×3 IMPLANT
TOWEL OR 17X26 10 PK STRL BLUE (TOWEL DISPOSABLE) ×3 IMPLANT
TUBE CONNECTING 12'X1/4 (SUCTIONS) ×1
TUBE CONNECTING 12X1/4 (SUCTIONS) ×2 IMPLANT
TULIP SPINE RELINE MOD (Neuro Prosthesis/Implant) ×5 IMPLANT
WATER STERILE IRR 1000ML POUR (IV SOLUTION) ×3 IMPLANT

## 2014-10-23 NOTE — Consult Note (Signed)
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 He was taken to OR 9/7 for repair and to ICU post-operatively on vent, PCCM to assume care.   STUDIES:  MR spine 9/5 > 1 cm non-enhancing lesion T10 vertebral body, Pathologic fracture of L3. Tumor extends into the posterior elements and transverse process on the right. There is extensive tumor in the canal causing severe spinal stenosis. Moderate spinal stenosis L4-5 due to degenerative change.   SIGNIFICANT EVENTS: 9/5 admit 9/7 to OR, out to ICU on vent  HISTORY OF PRESENT ILLNESS:  59 year old male with PMH as below, which is consistent for COPD, HTN, CVA, Ca, and CAD. He began having pain in his left leg a couple of month ago with associated lower back pain. He was diagnosed with arthritis and prescribed pain medications, however, his pain did not improve. He went back to ED and was sent to [facility] for MRI where a pathological L3 fracture was discovered with a mass in the hilar area of R lung and RLL mass. He was admitted to [facility] and reported recent weight loss and poor appetite. Also left leg pain had gotten markedly worse and much weaker over the past few months evan causing falls. 9/7 he was taken to OR for L3 decompression, L3 corpectomy, L1-5 fusion. Post operatively he was sent to ICU. PCCM to eval.   PAST MEDICAL HISTORY :   has a past medical history of COPD (chronic obstructive pulmonary disease); Hypertension; Stroke; Cancer; and Carotid artery disease.  has no past surgical history on file. Prior to Admission medications   Medication Sig Start Date End Date Taking? Authorizing Provider   budesonide-formoterol (SYMBICORT) 160-4.5 MCG/ACT inhaler Inhale 2 puffs into the lungs 2 (two) times daily. 05/29/14  Yes Historical Provider, MD  ezetimibe-simvastatin (VYTORIN) 10-20 MG per tablet Take 1 tablet by mouth daily.   Yes Historical Provider, MD  olmesartan-hydrochlorothiazide (BENICAR HCT) 20-12.5 MG per tablet Take 1 tablet by mouth daily.   Yes Historical Provider, MD   No Known Allergies  FAMILY HISTORY:  has no family status information on file.  SOCIAL HISTORY:  reports that he has been smoking.  He does not have any smokeless tobacco history on file. He reports that he uses illicit drugs. He reports that he does not drink alcohol.  REVIEW OF SYSTEMS:  Unable intubated  SUBJECTIVE:   VITAL SIGNS: Temp:  [97.4 F (36.3 C)-98.4 F (36.9 C)] 98.4 F (36.9 C) (09/07 0710) Pulse Rate:  [62-81] 74 (09/07 0710) Resp:  [15-20] 20 (09/07 0710) BP: (111-136)/(51-81) 136/81 mmHg (09/07 0710) SpO2:  [93 %-97 %] 94 % (09/07 0710) HEMODYNAMICS:   VENTILATOR SETTINGS:   INTAKE / OUTPUT:  Intake/Output Summary (Last 24 hours) at 10/23/14 1727 Last data filed at 10/23/14 1610  Gross per 24 hour  Intake   9094 ml  Output   4775 ml  Net   4319 ml    PHYSICAL EXAMINATION: General: adult male, critically ill appearing. Neuro: RASS - 3. HEENT: [place]/AT. PERRL, sclerae anicteric. Cardiovascular: RRR, no M/R/G. ETT in place. Lungs: Respirations even and unlabored.  CTA bilaterally. Abdomen: BS x 4, soft, NT/ND.  Musculoskeletal: No gross deformities, no edema.  Skin: Intact, warm, no rashes.    LABS:  CBC  Recent Labs Lab 10/21/14 1130 10/22/14 0330 10/23/14 0254  WBC 11.2* 5.5 11.5*  HGB 15.3 14.5 11.6*  HCT 44.2 40.9 33.1*  PLT 238 208 189   Coag's  Recent Labs Lab 10/22/14 0330  APTT 30  INR 1.06   BMET  Recent Labs Lab 10/21/14 1130 10/22/14 0330 10/23/14 0254  NA 136 135 134*  K 4.0 5.3* 3.4*  CL 100* 101 102  CO2 '27 24 23  '$ BUN 22* 19  29*  CREATININE 1.40* 1.52* 1.56*  GLUCOSE 110* 139* 127*   Electrolytes  Recent Labs Lab 10/21/14 2255 10/22/14 0330 10/23/14 0254  CALCIUM 9.3 9.6 8.5*  MG 1.8  --   --    Sepsis Markers No results for input(s): LATICACIDVEN, PROCALCITON, O2SATVEN in the last 168 hours. ABG No results for input(s): PHART, PCO2ART, PO2ART in the last 168 hours. Liver Enzymes  Recent Labs Lab 10/21/14 2255 10/22/14 0330  AST 22 22  ALT 20 20  ALKPHOS 132* 131*  BILITOT 0.5 0.2*  ALBUMIN 3.5 3.4*   Cardiac Enzymes No results for input(s): TROPONINI, PROBNP in the last 168 hours. Glucose  Recent Labs Lab 10/22/14 0750 10/23/14 0721  GLUCAP 114* 105*    Imaging Dg Lumbar Spine 2-3 Views  10/23/2014   CLINICAL DATA:  Lumbar spine surgery.  EXAM: DG C-ARM GT 120 MIN; LUMBAR SPINE - 2-3 VIEW  CONTRAST:  None.  FLUOROSCOPY TIME:  Fluoroscopy Time (in minutes and seconds): 1 min 55 seconds  Number of Acquired Images:  3  COMPARISON:  CT 10/21/2014.  FINDINGS: L3 corpectomy and L1 through L5 posterior fusion. Hardware intact. Scoliosis concave right .  IMPRESSION: Postsurgical changes lumbar spine as above.   Electronically Signed   By: Marcello Moores  Register   On: 10/23/2014 17:00   Dg C-arm Gt 120 Min  10/23/2014   CLINICAL DATA:  Lumbar spine surgery.  EXAM: DG C-ARM GT 120 MIN; LUMBAR SPINE - 2-3 VIEW  CONTRAST:  None.  FLUOROSCOPY TIME:  Fluoroscopy Time (in minutes and seconds): 1 min 55 seconds  Number of Acquired Images:  3  COMPARISON:  CT 10/21/2014.  FINDINGS: L3 corpectomy and L1 through L5 posterior fusion. Hardware intact. Scoliosis concave right .  IMPRESSION: Postsurgical changes lumbar spine as above.   Electronically Signed   By: Marcello Moores  Register   On: 10/23/2014 17:00     ASSESSMENT / PLAN:  PULMONARY OETT 9/7 >> A: Acute respiratory failure in postoperative setting Hilar Mass, RLL mass. 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  Follow CBC  INFECTIOUS A:   No acute issues  P:   Monitor off ABX  ENDOCRINE A:   No acute issues   P:   Monitor glucose on chemistry  NEUROLOGIC A:   Pathologic L3 fracture secondary to bone mets s/p surgical repair Acute encephalopathy secondary to medical  sedation H/o CVA  P:   RASS goal:-1 PRN Fentanyl gtt for sedation/analegesia Neurosurgery following   FAMILY  - Updates:   - Inter-disciplinary family meet or Palliative Care meeting due by:  9/14 >>>  Georgann Housekeeper, AGACNP-BC [place] [facility] Pager [phone] or [phone]  10/23/2014 7:14 PM

## 2014-10-23 NOTE — Op Note (Signed)
10/21/2014 - 10/23/2014  5:22 PM  PATIENT:  Edward Duran  59 y.o. male  PRE-OPERATIVE DIAGNOSIS:  Pathological fracture L3, extradural mass,spinal deformity, spinal stenosis  POST-OPERATIVE DIAGNOSIS:  Same  PROCEDURE:  Laminectomy inferior L2, L3, superior L4 for decompression and tumor resection, L3 corpectomy with anterior fusion L2-L4 using expandable titanium cage, dorsal internal fixation andion with pedicle screws at L1, L2, L4, L5, posterior arthrodesis L1-L2 and L4-L5  SURGEON:  Marland Kitchen, MD  ASSISTANTS: Jovita Gamma, MD  ANESTHESIA:   General  DRAINS: Medium hemovac x2  SPECIMEN:  Spine tumor  INDICATION FOR PROCEDURE: Patient newly diagnosed lung mass, also found to have a pathological fracture at L3 with spinal deformity. He was found to have a mass compressing the thecal sac at L3. He had subjective weakness but no objective neurological deficits. I recommended a procedure to decompress his thecal sac, resect the tumor, and stabilize his spine. Patient understood the risks, benefits, and alternatives and potential outcomes and wished to proceed.  PROCEDURE DETAILS: After smooth induction of general endotracheal anesthesia the patient was turned prone on the [place] table. The skin was clipped hair. Was wiped down with alcohol. He was prepped with Betadine scrub and ChloraPrep. Sterile drapes were applied. A mixture of lidocaine and Marcaine with epinephrine was injected subcutaneously.  A midline incision was made over the lumbar region from approximately L2-L4. A subperiosteal dissection was performed bilaterally until tumor was identified. This marked L3. Dissection was extended to the transverse process accessory tubercle of L1 superiorly into L5 inferiorly.  Pedicle screws were inserted at L1, L2, L4, and L5 bilaterally utilizing the Nuvasive Reline system.  Intraoperative fluoroscopy was used to determine pedicle screw entry points and to ensure appropriate positioning  of the screws.  A laminectomy was performed at inferior L2, L3, and superior L4.  This exposed tumor in the epidural space. I resected all of the tumor in the epidural space. I identified the L2 and L3 nerve roots as they exited and followed them lateral to the pedicles of L2 and L3 respectively. The Nuvasive pedicle subtraction osteotomy retractor was used to distract the screws at L2 and L4 on the right. Tumor was resected between the L2 and L3 nerve roots. The thecal sac was freed circumferentially from just cephalad to the L2 nerve root to just caudal to the L3 nerve root. The thecal sac was retracted and the disc spaces were incised on the right side. Discectomy was performed above and below. Tumor was resected from the L3 vertebral body. I then went the other side perform the same maneuvers to incise the disc space and maximally resected tumor from the left side.  Disc material was maximally removed from the inferior endplate of L2 and superior endplate of L4. The surface of these vertebral bodies was decorticated using ring curettes and a rasp. I then proceeded to measure the space between inferior L2 and superior L4. This was found be 30 mm. An expandable titanium cage with 22 mm round and 4 lordosis each was packed with morselized allograft and demineralized bone matrix as well as vancomycin powder and inserted between L2 and L4. It was expanded to its maximum height of 32 mm. The locking screw was then tightened using torque device.  The Bendini rod bending system was used to facilitate rod bending. Two 5.5 mm titanium rods were inserted into the screw heads and secured with set screws. These were then finally tightened with the torque device. The wound was irrigated  vigorously with bacitracin saline. Using a high-speed bur the posterior elements and transverse processes of L1 and L2 L4 and L5 were decorticated to obtain bleeding bone. A VITAS strip was laid on each side from the remaining lamina of  L2 to the remaining lamina of L4.  Fusion strut substrate which included osteo-cell morcellized allograft and DBX was laid over the decorticated transverse process and the intervening spaces from L1-L2, L2-L4, and L4-L5. A 50 mm cross link was used to obtain triangular fixation.  A double barrel medium Hemovac drain was placed. 1 g of Vancomycin powder was placed in the subfascial space. The wound was closed with interrupted Vicryl sutures in multiple layers. The skin was closed with Dermabond.  The patient was then turned supine on the bed he was able to move his legs postoperatively remained intubated and sedated. He was transferred to the ICU.   PATIENT DISPOSITION:  ICU   Delay start of Pharmacological VTE agent (>24hrs) due to surgical blood loss or risk of bleeding:  Yes.

## 2014-10-23 NOTE — Progress Notes (Signed)
Missed and unable to see patient since he is in [place],  Domenic Polite, [place]

## 2014-10-23 NOTE — Progress Notes (Signed)
No acute overnight issues AVSS Awake and alert Motor 5/5 BLE Sensation intact to light touch in BLE Ready for OR L3 decompression, L3 corpectomy, L1-5 fusion

## 2014-10-23 NOTE — Transfer of Care (Signed)
Immediate Anesthesia Transfer of Care Note  Patient: Edward Duran  Procedure(s) Performed: Procedure(s): Lumbar three decompression and corpectomy with interbody cage and lumbar one to lumbar five dorsal internal fixation and fusion  (N/A)  Patient Location: NICU  Anesthesia Type:General  Level of Consciousness: sedated, unresponsive, responds to stimulation and Patient remains intubated per anesthesia plan  Airway & Oxygen Therapy: Patient remains intubated per anesthesia plan and Patient placed on Ventilator (see vital sign flow sheet for setting)  Post-op Assessment: Report given to RN, Post -op Vital signs reviewed and unstable, Anesthesiologist notified and Patient moving all extremities  Post vital signs: Reviewed and unstable  Last Vitals:  Filed Vitals:   10/23/14 0710  BP: 136/81  Pulse: 74  Temp: 36.9 C  Resp: 20    Complications: No apparent anesthesia complications

## 2014-10-23 NOTE — Anesthesia Procedure Notes (Signed)
Procedure Name: Intubation Date/Time: 10/23/2014 8:27 AM Performed by: [place] Pink Pre-anesthesia Checklist: Patient identified, Emergency Drugs available, Suction available, Patient being monitored and Timeout performed Patient Re-evaluated:Patient Re-evaluated prior to inductionOxygen Delivery Method: Circle system utilized Preoxygenation: Pre-oxygenation with 100% oxygen Intubation Type: IV induction Ventilation: Mask ventilation without difficulty Laryngoscope Size: Mac and 3 Grade View: Grade I Tube type: Oral Tube size: 7.5 mm Number of attempts: 1 Airway Equipment and Method: Stylet Placement Confirmation: ETT inserted through vocal cords under direct vision,  positive ETCO2 and breath sounds checked- equal and bilateral Secured at: 22 cm Tube secured with: Tape Dental Injury: Teeth and Oropharynx as per pre-operative assessment

## 2014-10-23 NOTE — Anesthesia Preprocedure Evaluation (Addendum)
Anesthesia Evaluation  Patient identified by MRN, date of birth, ID band Patient awake    Reviewed: Allergy & Precautions, NPO status , Patient's Chart, lab work & pertinent test results  Airway Mallampati: II  TM Distance: >3 FB Neck ROM: Full    Dental   Pulmonary COPD, Current Smoker,    breath sounds clear to auscultation       Cardiovascular hypertension, + Peripheral Vascular Disease   Rhythm:Regular Rate:Normal     Neuro/Psych    GI/Hepatic Neg liver ROS,   Endo/Other    Renal/GU Renal disease     Musculoskeletal   Abdominal   Peds  Hematology   Anesthesia Other Findings   Reproductive/Obstetrics                            Anesthesia Physical Anesthesia Plan  ASA: III  Anesthesia Plan: General   Post-op Pain Management:    Induction: Intravenous  Airway Management Planned: Oral ETT  Additional Equipment:   Intra-op Plan:   Post-operative Plan: Possible Post-op intubation/ventilation  Informed Consent: I have reviewed the patients History and Physical, chart, labs and discussed the procedure including the risks, benefits and alternatives for the proposed anesthesia with the patient or authorized representative who has indicated his/her understanding and acceptance.   Dental advisory given  Plan Discussed with: CRNA, Anesthesiologist and Surgeon  Anesthesia Plan Comments:        Anesthesia Quick Evaluation

## 2014-10-24 ENCOUNTER — Inpatient Hospital Stay (HOSPITAL_COMMUNITY): Payer: Medicaid Other

## 2014-10-24 LAB — GLUCOSE, CAPILLARY: Glucose-Capillary: 130 mg/dL — ABNORMAL HIGH (ref 65–99)

## 2014-10-24 LAB — CBC WITH DIFFERENTIAL/PLATELET
BASOS ABS: 0 10*3/uL (ref 0.0–0.1)
BASOS PCT: 0 % (ref 0–1)
Eosinophils Absolute: 0.1 10*3/uL (ref 0.0–0.7)
Eosinophils Relative: 1 % (ref 0–5)
HEMATOCRIT: 27.5 % — AB (ref 39.0–52.0)
HEMOGLOBIN: 9.5 g/dL — AB (ref 13.0–17.0)
Lymphocytes Relative: 10 % — ABNORMAL LOW (ref 12–46)
Lymphs Abs: 0.8 10*3/uL (ref 0.7–4.0)
MCH: 28.6 pg (ref 26.0–34.0)
MCHC: 34.5 g/dL (ref 30.0–36.0)
MCV: 82.8 fL (ref 78.0–100.0)
Monocytes Absolute: 0.9 10*3/uL (ref 0.1–1.0)
Monocytes Relative: 11 % (ref 3–12)
NEUTROS ABS: 6.7 10*3/uL (ref 1.7–7.7)
NEUTROS PCT: 78 % — AB (ref 43–77)
Platelets: 125 10*3/uL — ABNORMAL LOW (ref 150–400)
RBC: 3.32 MIL/uL — ABNORMAL LOW (ref 4.22–5.81)
RDW: 16.3 % — ABNORMAL HIGH (ref 11.5–15.5)
WBC: 8.5 10*3/uL (ref 4.0–10.5)

## 2014-10-24 LAB — PREPARE FRESH FROZEN PLASMA
UNIT DIVISION: 0
Unit division: 0
Unit division: 0

## 2014-10-24 LAB — BASIC METABOLIC PANEL
Anion gap: 5 (ref 5–15)
BUN: 15 mg/dL (ref 6–20)
CO2: 22 mmol/L (ref 22–32)
Calcium: 7.2 mg/dL — ABNORMAL LOW (ref 8.9–10.3)
Chloride: 111 mmol/L (ref 101–111)
Creatinine, Ser: 1.02 mg/dL (ref 0.61–1.24)
GFR calc Af Amer: >60 mL/min (ref >60)
GFR calc non Af Amer: >60 mL/min (ref >60)
Glucose, Bld: 121 mg/dL — ABNORMAL HIGH (ref 65–99)
Potassium: 3.7 mmol/L (ref 3.5–5.1)
Sodium: 138 mmol/L (ref 135–145)

## 2014-10-24 LAB — PREPARE PLATELET PHERESIS
UNIT DIVISION: 0
UNIT DIVISION: 0

## 2014-10-24 LAB — TROPONIN I
Troponin I: 0.04 ng/mL — ABNORMAL HIGH (ref <0.031)
Troponin I: 0.08 ng/mL — ABNORMAL HIGH (ref <0.031)

## 2014-10-24 LAB — PHOSPHORUS: Phosphorus: 2.4 mg/dL — ABNORMAL LOW (ref 2.5–4.6)

## 2014-10-24 LAB — MAGNESIUM: MAGNESIUM: 2.2 mg/dL (ref 1.7–2.4)

## 2014-10-24 MED ORDER — FENTANYL CITRATE (PF) 100 MCG/2ML IJ SOLN
12.5000 ug | INTRAMUSCULAR | Status: DC | PRN
  Administered 2014-10-24 – 2014-10-26 (×11): 25 ug via INTRAVENOUS
  Filled 2014-10-24 (×12): qty 2

## 2014-10-24 MED ORDER — CHLORHEXIDINE GLUCONATE 0.12 % MT SOLN: 15.0000 mL | Freq: Two times a day (BID) | OROMUCOSAL | Status: DC

## 2014-10-24 MED ORDER — CETYLPYRIDINIUM CHLORIDE 0.05 % MT LIQD: 7.0000 mL | Freq: Two times a day (BID) | OROMUCOSAL | Status: DC

## 2014-10-24 NOTE — Anesthesia Postprocedure Evaluation (Signed)
  Anesthesia Post-op Note  Patient: Edward Duran  Procedure(s) Performed: Procedure(s): Lumbar three decompression and corpectomy with interbody cage and lumbar one to lumbar five dorsal internal fixation and fusion  (N/A)  Patient Location: ICU  Anesthesia Type:General  Level of Consciousness: awake, alert , oriented, patient cooperative and responds to stimulation  Airway and Oxygen Therapy: Patient Spontanous Breathing  Post-op Pain: none  Post-op Assessment: Post-op Vital signs reviewed, Patient's Cardiovascular Status Stable, Respiratory Function Stable, Patent Airway, No signs of Nausea or vomiting, Adequate PO intake and Pain level controlled LLE Motor Response: Purposeful movement, Responds to commands   RLE Motor Response: Purposeful movement, Responds to commands        Post-op Vital Signs: Reviewed and stable  Last Vitals:  Filed Vitals:   10/24/14 2100  BP: 92/75  Pulse: 112  Temp:   Resp: 19    Complications: No apparent anesthesia complications, extubated and stable

## 2014-10-24 NOTE — Progress Notes (Signed)
Pt seen and examined. No issues overnight.  EXAM: Temp:  [97.6 F (36.4 C)-99.1 F (37.3 C)] 99.1 F (37.3 C) (09/08 0107) Pulse Rate:  [74-128] 79 (09/08 0200) Resp:  [11-24] 15 (09/08 0200) BP: (72-193)/(51-81) 138/53 mmHg (09/08 0200) SpO2:  [94 %-100 %] 100 % (09/08 0200) Arterial Line BP: (69-157)/(46-68) 76/68 mmHg (09/08 0100) FiO2 (%):  [30 %-40 %] 40 % (09/08 0217) Weight:  [57.6 kg (126 lb 15.8 oz)] 57.6 kg (126 lb 15.8 oz) (09/07 1815) Intake/Output      09/07 0701 - 09/08 0700   I.V. (mL/kg) 5808 (100.8)   Blood 3933   IV Piggyback 1000   Total Intake(mL/kg) 10741 (186.5)   Urine (mL/kg/hr) 2115 (1.5)   Drains 210 (0.2)   Blood 2800 (2)   Total Output 5125   Net +5616        Awake, alert, intubated Follows commands briskly Flexes hips and extends legs with full strength, plantarflexes and dorsiflexes briskly with good strength  LABS: Lab Results  Component Value Date   CREATININE 1.08 10/23/2014   BUN 15 10/23/2014   NA 139 10/23/2014   K 4.3 10/23/2014   CL 112* 10/23/2014   CO2 24 10/23/2014   Lab Results  Component Value Date   WBC 8.5 10/24/2014   HGB 9.5* 10/24/2014   HCT 27.5* 10/24/2014   MCV 82.8 10/24/2014   PLT 125* 10/24/2014     IMPRESSION: - 59 y.o. male POD1 s/p L3 decompression, corpectomy, L1-5 fusion.  Neurologically well.  PLAN: - Continue resuscitation - Possibly extubate today - Keep drain for now

## 2014-10-24 NOTE — Care Management Note (Signed)
Case Management Note  Patient Details  Name: CHADLEY DZIEDZIC MRN: [id number] Date of Birth: 12-26-1955  Subjective/Objective:  Pt s/p decompressive laminectomy and tumor resection on 10/23/14.  PTA, pt resided at home with family member.                    Action/Plan: Will follow for discharge planning as pt progresses.    Expected Discharge Date:  10/24/14               Expected Discharge Plan:  [place]  In-House Referral:     Discharge planning Services  CM Consult  Post Acute Care Choice:    Choice offered to:     DME Arranged:    DME Agency:     HH Arranged:    HH Agency:     Status of Service:  In process, will continue to follow  Medicare Important Message Given:    Date Medicare IM Given:    Medicare IM give by:    Date Additional Medicare IM Given:    Additional Medicare Important Message give by:     If discussed at [place] of Stay Meetings, dates discussed:    Additional Comments:  Reinaldo Raddle, RN, BSN  Trauma/Neuro ICU Case Manager [phone]

## 2014-10-24 NOTE — Consult Note (Signed)
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 He was taken to OR 9/7 for repair and to ICU post-operatively on vent, PCCM to assume care.   STUDIES:  MR spine 9/5 > 1 cm non-enhancing lesion T10 vertebral body, Pathologic fracture of L3. Tumor extends into the posterior elements and transverse process on the right. There is extensive tumor in the canal causing severe spinal stenosis. Moderate spinal stenosis L4-5 due to degenerative change.   SIGNIFICANT EVENTS: 9/5 admit 9/7 to OR, out to ICU on vent 2.8L blood loss, required massive transfusion  SUBJECTIVE:  Surgery yesterday, 2.8L blood loss, required massive transfusion, comfortable on vent this morning.  VITAL SIGNS: Temp:  [97.6 F (36.4 C)-99.8 F (37.7 C)] 99.8 F (37.7 C) (09/08 0819) Pulse Rate:  [77-128] 102 (09/08 0800) Resp:  [11-24] 22 (09/08 0800) BP: (72-193)/(51-77) 113/73 mmHg (09/08 0800) SpO2:  [99 %-100 %] 100 % (09/08 0800) Arterial Line BP: (69-157)/(46-68) 76/68 mmHg (09/08 0100) FiO2 (%):  [30 %-40 %] 40 % (09/08 0800) Weight:  [126 lb 15.8 oz (57.6 kg)] 126 lb 15.8 oz (57.6 kg) (09/07 1815) HEMODYNAMICS:   VENTILATOR SETTINGS: Vent Mode:  [-] CPAP;PSV FiO2 (%):  [30 %-40 %] 40 % Set Rate:  [12 bmp-16 bmp] 12 bmp Vt Set:  [500 mL-510 mL] 510 mL PEEP:  [5 cmH20] 5 cmH20 Pressure Support:  [5 cmH20] 5 cmH20 Plateau Pressure:  [12 cmH20-25 cmH20] 12 cmH20 INTAKE / OUTPUT:  Intake/Output Summary (Last 24 hours) at 10/24/14 0929 Last data filed at 10/24/14 0800  Gross per 24 hour  Intake 11682.5 ml  Output   5840 ml  Net 5842.5 ml    PHYSICAL  EXAMINATION: General: chronically ill appearing on vent HENT:NCAT, ETT in place PULM: few rhonchi bilaterally, vent supported breaths but comfortable on pressure support mode, diminished somewhat right base CV: RRR, no mgr GI: BS+, soft, nontender MSK: diminished bulk, no obvious deformities Neuro: moves all four ext, follows commands    LABS:  CBC  Recent Labs Lab 10/23/14 1700 10/23/14 1725 10/23/14 1918 10/24/14 0226  WBC 8.3  --  7.5 8.5  HGB 11.3* 11.6* 11.8* 9.5*  HCT 33.3* 34.0* 34.2* 27.5*  PLT 62*  --  59* 125*   Coag's  Recent Labs Lab 10/22/14 0330 10/23/14 1700 10/23/14 1918  APTT 30  --   --   INR 1.06 1.48 1.31   BMET  Recent Labs Lab 10/23/14 0254  10/23/14 1229  10/23/14 1725 10/23/14 1918 10/24/14 0226  NA 134*  < > 138  < > 140 139 138  K 3.4*  < > 3.7  < > 4.8 4.3 3.7  CL 102  --   --   --   --  112* 111  CO2 23  --   --   --   --  24 22  BUN 29*  --   --   --   --  15 15  CREATININE 1.56*  --   --   --   --  1.08 1.02  GLUCOSE 127*  < > 119*  --   --  132* 121*  < > =  values in this interval not displayed. Electrolytes  Recent Labs Lab 10/21/14 2255  10/23/14 0254 10/23/14 1918 10/24/14 0226  CALCIUM 9.3  < > 8.5* 7.9* 7.2*  MG 1.8  --   --  1.3* 2.2  PHOS  --   --   --  2.9 2.4*  < > = values in this interval not displayed. Sepsis Markers  Recent Labs Lab 10/23/14 1920  LATICACIDVEN 1.3   ABG  Recent Labs Lab 10/23/14 1621 10/23/14 1725 10/23/14 2005  PHART 7.312* 7.286* 7.385  PCO2ART 42.8 49.1* 35.8  PO2ART 240.0* 376.0* 127*   Liver Enzymes  Recent Labs Lab 10/21/14 2255 10/22/14 0330 10/23/14 1918  AST '22 22 29  '$ ALT 20 20 13*  ALKPHOS 132* 131* 46  BILITOT 0.5 0.2* 1.7*  ALBUMIN 3.5 3.4* 2.9*   Cardiac Enzymes  Recent Labs Lab 10/23/14 1918 10/24/14 0226  TROPONINI 0.03 0.04*   Glucose  Recent Labs Lab 10/22/14 0750 10/23/14 0721 10/24/14 0817  GLUCAP 114* 105* 130*     Imaging Dg Lumbar Spine 2-3 Views  10/23/2014   CLINICAL DATA:  Lumbar spine surgery.  EXAM: DG C-ARM GT 120 MIN; LUMBAR SPINE - 2-3 VIEW  CONTRAST:  None.  FLUOROSCOPY TIME:  Fluoroscopy Time (in minutes and seconds): 1 min 55 seconds  Number of Acquired Images:  3  COMPARISON:  CT 10/21/2014.  FINDINGS: L3 corpectomy and L1 through L5 posterior fusion. Hardware intact. Scoliosis concave right .  IMPRESSION: Postsurgical changes lumbar spine as above.   Electronically Signed   By: Marcello Moores  Register   On: 10/23/2014 17:00   Dg C-arm Gt 120 Min  10/23/2014   CLINICAL DATA:  Lumbar spine surgery.  EXAM: DG C-ARM GT 120 MIN; LUMBAR SPINE - 2-3 VIEW  CONTRAST:  None.  FLUOROSCOPY TIME:  Fluoroscopy Time (in minutes and seconds): 1 min 55 seconds  Number of Acquired Images:  3  COMPARISON:  CT 10/21/2014.  FINDINGS: L3 corpectomy and L1 through L5 posterior fusion. Hardware intact. Scoliosis concave right .  IMPRESSION: Postsurgical changes lumbar spine as above.   Electronically Signed   By: Marcello Moores  Register   On: 10/23/2014 17:00     ASSESSMENT / PLAN:  PULMONARY OETT 9/7 >> A: Acute respiratory failure in postoperative setting > resolved Hilar Mass, RLL mass > presumably metastatic lung cancer COPD without acute exacerbation  P:   Extubate Oral care post extubation Bronchodilators : scheduled duoneb, prn albuterol VAP bundle  CARDIOVASCULAR A:  PVD H/o CAD Shock> related to blood loss and sedation, now resolved P:  Telemetry  PRN hydralazine Holding benicar, vytorin Neosynephrine PRN MAP < 65  RENAL A:   AKI > resolved P:   Monitor BMET and UOP Replace electrolytes as needed   GASTROINTESTINAL A:   No acute issues P:   Protonix for SUP Advance diet post extubation  HEMATOLOGIC/ONC A:   Acute blood loss anemia Lung Ca with mets to bone  P:  Monitor for bleeding Appreciate oncology consult Will f/u path report> would hope that foundation 1 could be performed  on bone mass given the size, on CT imaging it was very large and did not appear to be completely calcified  INFECTIOUS A:   No acute issues  P:   Monitor off ABX  ENDOCRINE A:   No acute issues   P:   Monitor glucose on chemistry  NEUROLOGIC A:   Pathologic L3 fracture secondary to bone mets s/p surgical repair H/o CVA  P:   Stop sedation Fentanyl PRN pain Neurosurgery following   FAMILY  - Updates: none at bedside  - Inter-disciplinary family meet or Palliative Care meeting due by:  9/14 >>>  My cc time 35 minutes  Roselie Awkward, MD [place] [facility] Pager: [phone] Cell: [phone] After 3pm or if no response, call [phone]   10/24/2014 9:29 AM

## 2014-10-24 NOTE — Progress Notes (Signed)
[facility] Progress Note Patient Name: ANTERIO SCHEEL DOB: September 18, 1955 MRN: [id number]   Date of Service  10/24/2014  HPI/Events of Note  Patient requests diet. Awake, alert and able to follow commands. Able to handle sips of water.   eICU Interventions  Advance diet as tolerated.      Intervention Category Minor Interventions: Routine modifications to care plan (e.g. PRN medications for pain, fever)  Marianny Goris Eugene 10/24/2014, 3:21 PM

## 2014-10-24 NOTE — Procedures (Signed)
Extubation Procedure Note  Patient Details:   Name: Edward Duran DOB: 1956-01-28 MRN: [id number]   Airway Documentation:     Evaluation  O2 sats: stable throughout Complications: No apparent complications Patient did tolerate procedure well. Bilateral Breath Sounds: Coarse crackles Suctioning: Airway Yes   Patient extubated to 4 LNC at this time per MD order. Tolerated well. RT to monitor as needed  Saunders Glance 10/24/2014, 10:08 AM

## 2014-10-24 NOTE — Progress Notes (Signed)
Subjective: Patient sitting up with head of bed slightly elevated. Extubated earlier this morning, breathing stably off the ventilator. Moderate drainage into wound drain.  Objective: Vital signs in last 24 hours: Filed Vitals:   10/24/14 0900 10/24/14 1000 10/24/14 1007 10/24/14 1100  BP: 122/64 135/71 135/71 123/72  Pulse: 116 113 124 104  Temp:      TempSrc:      Resp: '23 24 22 22  '$ Height:      Weight:      SpO2: 99% 100% 99% 99%    Intake/Output from previous day: 09/07 0701 - 09/08 0700 In: 12582.5 [I.V.:6697; Blood:4885.5; IV Piggyback:1000] Out: 0998 [Urine:2445; Drains:510; Blood:2800] Intake/Output this shift: Total I/O In: 508 [I.V.:508] Out: 460 [Urine:335; Drains:125]  Physical Exam:  Awake, following commands. Moving all 4 extremities well. Strength in lower extremities 5/5 in the iliopsoas, quadriceps, dorsi flexor, and plantar flexor bilaterally. Wound clean and dry.  CBC  Recent Labs  10/23/14 1918 10/24/14 0226  WBC 7.5 8.5  HGB 11.8* 9.5*  HCT 34.2* 27.5*  PLT 59* 125*   BMET  Recent Labs  10/23/14 1918 10/24/14 0226  NA 139 138  K 4.3 3.7  CL 112* 111  CO2 24 22  GLUCOSE 132* 121*  BUN 15 15  CREATININE 1.08 1.02  CALCIUM 7.9* 7.2*    Studies/Results: Dg Lumbar Spine 2-3 Views  10/23/2014   CLINICAL DATA:  Lumbar spine surgery.  EXAM: DG C-ARM GT 120 MIN; LUMBAR SPINE - 2-3 VIEW  CONTRAST:  None.  FLUOROSCOPY TIME:  Fluoroscopy Time (in minutes and seconds): 1 min 55 seconds  Number of Acquired Images:  3  COMPARISON:  CT 10/21/2014.  FINDINGS: L3 corpectomy and L1 through L5 posterior fusion. Hardware intact. Scoliosis concave right .  IMPRESSION: Postsurgical changes lumbar spine as above.   Electronically Signed   By: Marcello Moores  Register   On: 10/23/2014 17:00   Dg C-arm Gt 120 Min  10/23/2014   CLINICAL DATA:  Lumbar spine surgery.  EXAM: DG C-ARM GT 120 MIN; LUMBAR SPINE - 2-3 VIEW  CONTRAST:  None.  FLUOROSCOPY TIME:  Fluoroscopy Time  (in minutes and seconds): 1 min 55 seconds  Number of Acquired Images:  3  COMPARISON:  CT 10/21/2014.  FINDINGS: L3 corpectomy and L1 through L5 posterior fusion. Hardware intact. Scoliosis concave right .  IMPRESSION: Postsurgical changes lumbar spine as above.   Electronically Signed   By: Marcello Moores  Register   On: 10/23/2014 17:00    Assessment/Plan: Patient making good progress following surgery yesterday. Continue supportive care through recovery.   Hosie Spangle, MD 10/24/2014, 11:38 AM

## 2014-10-25 DIAGNOSIS — M899 Disorder of bone, unspecified: Secondary | ICD-10-CM

## 2014-10-25 DIAGNOSIS — J9819 Other pulmonary collapse: Secondary | ICD-10-CM

## 2014-10-25 DIAGNOSIS — R222 Localized swelling, mass and lump, trunk: Secondary | ICD-10-CM

## 2014-10-25 LAB — CBC WITH DIFFERENTIAL/PLATELET
BASOS ABS: 0 10*3/uL (ref 0.0–0.1)
BASOS PCT: 0 % (ref 0–1)
EOS ABS: 0.2 10*3/uL (ref 0.0–0.7)
Eosinophils Relative: 1 % (ref 0–5)
HEMATOCRIT: 26 % — AB (ref 39.0–52.0)
HEMOGLOBIN: 9.1 g/dL — AB (ref 13.0–17.0)
Lymphocytes Relative: 6 % — ABNORMAL LOW (ref 12–46)
Lymphs Abs: 0.7 10*3/uL (ref 0.7–4.0)
MCH: 29.4 pg (ref 26.0–34.0)
MCHC: 35 g/dL (ref 30.0–36.0)
MCV: 84.1 fL (ref 78.0–100.0)
Monocytes Absolute: 1 10*3/uL (ref 0.1–1.0)
Monocytes Relative: 9 % (ref 3–12)
NEUTROS ABS: 9.6 10*3/uL — AB (ref 1.7–7.7)
NEUTROS PCT: 84 % — AB (ref 43–77)
Platelets: 110 10*3/uL — ABNORMAL LOW (ref 150–400)
RBC: 3.09 MIL/uL — ABNORMAL LOW (ref 4.22–5.81)
RDW: 16.4 % — AB (ref 11.5–15.5)
WBC: 11.5 10*3/uL — ABNORMAL HIGH (ref 4.0–10.5)

## 2014-10-25 LAB — GLUCOSE, CAPILLARY: GLUCOSE-CAPILLARY: 113 mg/dL — AB (ref 65–99)

## 2014-10-25 LAB — MAGNESIUM: MAGNESIUM: 1.9 mg/dL (ref 1.7–2.4)

## 2014-10-25 LAB — BASIC METABOLIC PANEL
ANION GAP: 5 (ref 5–15)
BUN: 12 mg/dL (ref 6–20)
CALCIUM: 7.5 mg/dL — AB (ref 8.9–10.3)
CHLORIDE: 107 mmol/L (ref 101–111)
CO2: 23 mmol/L (ref 22–32)
CREATININE: 1.01 mg/dL (ref 0.61–1.24)
GFR calc non Af Amer: >60 mL/min (ref >60)
Glucose, Bld: 154 mg/dL — ABNORMAL HIGH (ref 65–99)
Potassium: 3.4 mmol/L — ABNORMAL LOW (ref 3.5–5.1)
Sodium: 135 mmol/L (ref 135–145)

## 2014-10-25 LAB — PHOSPHORUS: PHOSPHORUS: 1.7 mg/dL — AB (ref 2.5–4.6)

## 2014-10-25 MED ORDER — MAGNESIUM SULFATE 2 GM/50ML IV SOLN
2.0000 g | Freq: Once | INTRAVENOUS | Status: AC
  Administered 2014-10-25: 2 g via INTRAVENOUS
  Filled 2014-10-25: qty 50

## 2014-10-25 MED ORDER — FUROSEMIDE 10 MG/ML IJ SOLN
40.0000 mg | Freq: Once | INTRAMUSCULAR | Status: AC
  Administered 2014-10-25: 40 mg via INTRAVENOUS
  Filled 2014-10-25: qty 4

## 2014-10-25 MED ORDER — POTASSIUM PHOSPHATE MONOBASIC 500 MG PO TABS
500.0000 mg | ORAL_TABLET | Freq: Once | ORAL | Status: AC
  Administered 2014-10-25: 500 mg via ORAL
  Filled 2014-10-25: qty 1

## 2014-10-25 MED ORDER — POTASSIUM CHLORIDE CRYS ER 20 MEQ PO TBCR
40.0000 meq | EXTENDED_RELEASE_TABLET | Freq: Once | ORAL | Status: AC
  Administered 2014-10-25: 40 meq via ORAL
  Filled 2014-10-25: qty 2

## 2014-10-25 MED ORDER — PANTOPRAZOLE SODIUM 40 MG PO TBEC
40.0000 mg | DELAYED_RELEASE_TABLET | Freq: Every day | ORAL | Status: DC
  Administered 2014-10-25 – 2014-10-27 (×3): 40 mg via ORAL
  Filled 2014-10-25 (×4): qty 1

## 2014-10-25 MED ORDER — MAGNESIUM OXIDE 400 (241.3 MG) MG PO TABS
400.0000 mg | ORAL_TABLET | Freq: Once | ORAL | Status: AC
  Administered 2014-10-25: 400 mg via ORAL
  Filled 2014-10-25: qty 1

## 2014-10-25 NOTE — Progress Notes (Signed)
HEMATOLOGY-ONCOLOGY PROGRESS NOTE  SUBJECTIVE: Patient is recovering from surgery, he was extubated and appears to be breathing fairly normally. He denies any coughing spells or hemoptysis  OBJECTIVE: PHYSICAL EXAMINATION: ECOG PERFORMANCE STATUS: 3 - Symptomatic, >50% confined to bed  Filed Vitals:   10/25/14 1411  BP: 129/67  Pulse: 96  Temp: 99.3 F (37.4 C)  Resp: 20   Filed Weights   10/21/14 1047 10/21/14 2219 10/23/14 1815  Weight: 130 lb (58.968 kg) 112 lb 7 oz (51 kg) 126 lb 15.8 oz (57.6 kg)    GENERAL:alert, no distress and comfortable SKIN: skin color, texture, turgor are normal, no rashes or significant lesions EYES: normal, Conjunctiva are pink and non-injected, sclera clear OROPHARYNX:no exudate, no erythema and lips, buccal mucosa, and tongue normal  NECK: supple, thyroid normal size, non-tender, without nodularity LYMPH:  no palpable lymphadenopathy in the cervical, axillary or inguinal LUNGS: Diminished breath sounds both lung bases HEART: regular rate & rhythm and no murmurs and no lower extremity edema ABDOMEN:abdomen soft, non-tender and normal bowel sounds Musculoskeletal:no cyanosis of digits and no clubbing  NEURO: Moves all extremities, has sensation in both his feet  LABORATORY DATA:  I have reviewed the data as listed CMP Latest Ref Rng 10/25/2014 10/24/2014 10/23/2014  Glucose 65 - 99 mg/dL 154(H) 121(H) 132(H)  BUN 6 - 20 mg/dL '12 15 15  '$ Creatinine 0.61 - 1.24 mg/dL 1.01 1.02 1.08  Sodium 135 - 145 mmol/L 135 138 139  Potassium 3.5 - 5.1 mmol/L 3.4(L) 3.7 4.3  Chloride 101 - 111 mmol/L 107 111 112(H)  CO2 22 - 32 mmol/L '23 22 24  '$ Calcium 8.9 - 10.3 mg/dL 7.5(L) 7.2(L) 7.9(L)  Total Protein 6.5 - 8.1 g/dL - - 4.2(L)  Total Bilirubin 0.3 - 1.2 mg/dL - - 1.7(H)  Alkaline Phos 38 - 126 U/L - - 46  AST 15 - 41 U/L - - 29  ALT 17 - 63 U/L - - 13(L)    Lab Results  Component Value Date   WBC 11.5* 10/25/2014   HGB 9.1* 10/25/2014   HCT 26.0*  10/25/2014   MCV 84.1 10/25/2014   PLT 110* 10/25/2014   NEUTROABS 9.6* 10/25/2014    ASSESSMENT AND PLAN: 1. Right middle lobe Lung mass with collapse and consolidation accompanied by large L3 vertebral body mass status post decompression surgery. Pathology is currently pending. 2. Upon discharge from the hospital, he will need a whole body PET CT scan 3. Depending on the pathology, molecular testing may be necessary if it is adenocarcinoma. 4. He will need systemic chemotherapy which could be arranged through our partner Dr. Whitney Muse at [facility] and [place].  Thank you very much Dr.Doyle Tegethoff Lindi Adie MD

## 2014-10-25 NOTE — Progress Notes (Signed)
Subjective: Patient resting comfortably in bed.  Having significant post void residuals. Had significant cauda equina compression, probably will do best with a Foley catheter for bladder rest for a few days, and then another voiding trial.  200 mL of output into Hemovac over the past 12 hours.  Objective: Vital signs in last 24 hours: Filed Vitals:   10/25/14 0400 10/25/14 0500 10/25/14 0605 10/25/14 0731  BP: 106/62 118/63 140/81   Pulse: 96 92 101   Temp: 98.5 F (36.9 C)   98.7 F (37.1 C)  TempSrc: Oral   Oral  Resp: '20 21 20   '$ Height:      Weight:      SpO2: 98% 98% 98%     Intake/Output from previous day: 09/08 0701 - 09/09 0700 In: 2422.8 [I.V.:2422.8] Out: 2050 [Urine:1600; Drains:450] Intake/Output this shift:    Physical Exam:  Moving all 4 extremities well.  Wound clean and dry; no erythema, swelling, or drainage.  CBC  Recent Labs  10/24/14 0226 10/25/14 0225  WBC 8.5 11.5*  HGB 9.5* 9.1*  HCT 27.5* 26.0*  PLT 125* 110*   BMET  Recent Labs  10/24/14 0226 10/25/14 0225  NA 138 135  K 3.7 3.4*  CL 111 107  CO2 22 23  GLUCOSE 121* 154*  BUN 15 12  CREATININE 1.02 1.01  CALCIUM 7.2* 7.5*    Studies/Results: Dg Lumbar Spine 2-3 Views  10/23/2014   CLINICAL DATA:  Lumbar spine surgery.  EXAM: DG C-ARM GT 120 MIN; LUMBAR SPINE - 2-3 VIEW  CONTRAST:  None.  FLUOROSCOPY TIME:  Fluoroscopy Time (in minutes and seconds): 1 min 55 seconds  Number of Acquired Images:  3  COMPARISON:  CT 10/21/2014.  FINDINGS: L3 corpectomy and L1 through L5 posterior fusion. Hardware intact. Scoliosis concave right .  IMPRESSION: Postsurgical changes lumbar spine as above.   Electronically Signed   By: Marcello Moores  Register   On: 10/23/2014 17:00   Dg Chest Port 1 View  10/24/2014   CLINICAL DATA:  Endotracheal tube removed  EXAM: PORTABLE CHEST - 1 VIEW  COMPARISON:  Chest x-ray of 09/10/2014  FINDINGS: No endotracheal tube is visualized. The lungs are not well aerated and there  is more collapse of the right lower lobe and possibly right middle lobe. This may be due to a central endobronchial lesion, such as mucous plugging, and clinical correlation is recommended. The left lung is clear. Heart size is stable.  IMPRESSION: 1. Diminished aeration with collapse of the right middle lobe and possibly right lower lobe possibly due to a central endobronchial process as mucous plugging. Correlate clinically. 2. No endotracheal tube is seen.   Electronically Signed   By: Ivar Drape M.D.   On: 10/24/2014 12:17   Dg C-arm Gt 120 Min  10/23/2014   CLINICAL DATA:  Lumbar spine surgery.  EXAM: DG C-ARM GT 120 MIN; LUMBAR SPINE - 2-3 VIEW  CONTRAST:  None.  FLUOROSCOPY TIME:  Fluoroscopy Time (in minutes and seconds): 1 min 55 seconds  Number of Acquired Images:  3  COMPARISON:  CT 10/21/2014.  FINDINGS: L3 corpectomy and L1 through L5 posterior fusion. Hardware intact. Scoliosis concave right .  IMPRESSION: Postsurgical changes lumbar spine as above.   Electronically Signed   By: Marcello Moores  Register   On: 10/23/2014 17:00    Assessment/Plan: Doing well following surgery. Communicated with Dr. Cyndy Freeze, who does want the patient to have a lumbar brace; ordered.  We'll begin PT and OT  once braces available later this morning. Patient's nurse to discuss with CCM urinary retention management. We'll leave Hemovac in for now and on-call doctor over the weekend will reassess when drain can be removed.   Hosie Spangle, MD 10/25/2014, 8:17 AM

## 2014-10-25 NOTE — Progress Notes (Addendum)
Report given and all questions answered .Will transfer with all belongings in bed to 5M22.

## 2014-10-25 NOTE — Progress Notes (Signed)
Orthopedic Tech Progress Note Patient Details:  Edward Duran [birth year] [id number]  Patient ID: Edward Duran, male   DOB: 10-Oct-1955, 58 y.o.   MRN: [id number] Called in bio-tech brace order; spoke with Raul Del, Kinsley Holderman 10/25/2014, 9:10 AM

## 2014-10-25 NOTE — Progress Notes (Signed)
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 He was taken to OR 9/7 for repair and to ICU post-operatively on vent, PCCM to assume care.   STUDIES:  MR spine 9/5 > 1 cm non-enhancing lesion T10 vertebral body, Pathologic fracture of L3. Tumor extends into the posterior elements and transverse process on the right. There is extensive tumor in the canal causing severe spinal stenosis. Moderate spinal stenosis L4-5 due to degenerative change.   SIGNIFICANT EVENTS: 9/5 admit 9/7 to OR, out to ICU on vent 2.8L blood loss, required massive transfusion 9/8 extubated  SUBJECTIVE:  Extubated yesterday, eating, working with physical therapy  VITAL SIGNS: Temp:  [98 F (36.7 C)-99.3 F (37.4 C)] 98.7 F (37.1 C) (09/09 0731) Pulse Rate:  [87-124] 97 (09/09 0900) Resp:  [16-28] 24 (09/09 0900) BP: (92-140)/(52-94) 139/64 mmHg (09/09 0900) SpO2:  [90 %-100 %] 99 % (09/09 0900) FiO2 (%):  [40 %] 40 % (09/08 1000) HEMODYNAMICS:   VENTILATOR SETTINGS: Vent Mode:  [-]  FiO2 (%):  [40 %] 40 % INTAKE / OUTPUT:  Intake/Output Summary (Last 24 hours) at 10/25/14 0950 Last data filed at 10/25/14 0946  Gross per 24 hour  Intake 2208.75 ml  Output   1965 ml  Net 243.75 ml    PHYSICAL EXAMINATION: General: chronically ill appearing HENT:NCAT, OP clear PULM: diminished R base, rhonchi/crackles bilaterally CV: RRR, no mgr GI: BS+, soft, nontender MSK: diminished bulk, no obvious deformities Neuro: moves all four ext, awake, alert, conversant    LABS:  CBC  Recent Labs Lab 10/23/14 1918 10/24/14 0226 10/25/14 0225  WBC 7.5 8.5  11.5*  HGB 11.8* 9.5* 9.1*  HCT 34.2* 27.5* 26.0*  PLT 59* 125* 110*   Coag's  Recent Labs Lab 10/22/14 0330 10/23/14 1700 10/23/14 1918  APTT 30  --   --   INR 1.06 1.48 1.31   BMET  Recent Labs Lab 10/23/14 1918 10/24/14 0226 10/25/14 0225  NA 139 138 135  K 4.3 3.7 3.4*  CL 112* 111 107  CO2 '24 22 23  '$ BUN '15 15 12  '$ CREATININE 1.08 1.02 1.01  GLUCOSE 132* 121* 154*   Electrolytes  Recent Labs Lab 10/23/14 1918 10/24/14 0226 10/25/14 0225  CALCIUM 7.9* 7.2* 7.5*  MG 1.3* 2.2 1.9  PHOS 2.9 2.4* 1.7*   Sepsis Markers  Recent Labs Lab 10/23/14 1920  LATICACIDVEN 1.3   ABG  Recent Labs Lab 10/23/14 1621 10/23/14 1725 10/23/14 2005  PHART 7.312* 7.286* 7.385  PCO2ART 42.8 49.1* 35.8  PO2ART 240.0* 376.0* 127*   Liver Enzymes  Recent Labs Lab 10/21/14 2255 10/22/14 0330 10/23/14 1918  AST '22 22 29  '$ ALT 20 20 13*  ALKPHOS 132* 131* 46  BILITOT 0.5 0.2* 1.7*  ALBUMIN 3.5 3.4* 2.9*   Cardiac Enzymes  Recent Labs Lab 10/23/14 1918 10/24/14 0226 10/24/14 1058  TROPONINI 0.03 0.04* 0.08*   Glucose  Recent Labs Lab 10/22/14 0750 10/23/14 0721 10/24/14 0817 10/25/14 0728  GLUCAP 114* 105* 130* 113*    Imaging Dg Chest Port 1 View  10/24/2014   CLINICAL DATA:  Endotracheal tube removed  EXAM: PORTABLE CHEST - 1  VIEW  COMPARISON:  Chest x-ray of 09/10/2014  FINDINGS: No endotracheal tube is visualized. The lungs are not well aerated and there is more collapse of the right lower lobe and possibly right middle lobe. This may be due to a central endobronchial lesion, such as mucous plugging, and clinical correlation is recommended. The left lung is clear. Heart size is stable.  IMPRESSION: 1. Diminished aeration with collapse of the right middle lobe and possibly right lower lobe possibly due to a central endobronchial process as mucous plugging. Correlate clinically. 2. No endotracheal tube is seen.   Electronically Signed   By: Ivar Drape  M.D.   On: 10/24/2014 12:17     ASSESSMENT / PLAN:  PULMONARY OETT 9/7 >> A: Acute respiratory failure in postoperative setting > resolved Hilar Mass, RLL mass, RLL collapse> presumably metastatic lung cancer COPD without acute exacerbation Acute pulm edema> 9/9 P:   Bronchodilators : scheduled duoneb, prn albuterol Lasix x1 dose today  CARDIOVASCULAR A:  PVD H/o CAD Shock> resolved P:  Telemetry  PRN hydralazine Holding benicar, vytorin  RENAL A:   AKI > resolved Hypokalemia hypomagneseima Hypophosphatemia P:   Monitor BMET and UOP Replace electrolytes as needed (Kcl, Kphos, mag today)  GASTROINTESTINAL A:   No acute issues P:   Protonix for SUP Advance diet   HEMATOLOGIC/ONC A:   Acute blood loss anemia Lung Ca with mets to bone P:  Monitor for bleeding Appreciate oncology consult Will f/u path report> would hope that foundation 1 could be performed on bone mass given the size, on CT imaging it was very large and did not appear to be completely calcified  INFECTIOUS A:   No acute issues P:   Monitor off ABX  ENDOCRINE A:   No acute issues  P:   Monitor glucose on chemistry  NEUROLOGIC A:   Pathologic L3 fracture secondary to bone mets s/p surgical repair H/o CVA  P:   Fentanyl PRN pain Neurosurgery following PT consult   FAMILY  - Updates: son [place] updated at bedside 9/9  - Inter-disciplinary family meet or Palliative Care meeting due by:  9/14 >>>   Roselie Awkward, MD [place] [facility] Pager: [phone] Cell: [phone] After 3pm or if no response, call [phone]   10/25/2014 9:50 AM

## 2014-10-25 NOTE — Evaluation (Signed)
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  Clinical Impression  Pt admitted with/for Lumbar fusion surgery and resection of tumor.  Pt currently limited functionally due to the problems listed below.  (see problems list.)  Pt will benefit from PT to maximize function and safety to be able to get home safely with available assist  Of family.     Follow Up Recommendations Home health PT;Other (comment) (HHPT if slow to progress)    Equipment Recommendations   (?3 in 1)    Recommendations for Other Services       Precautions / Restrictions Precautions Precautions: Back;Fall Restrictions Weight Bearing Restrictions: No      Mobility  Bed Mobility Overal bed mobility: Needs Assistance Bed Mobility: Sidelying to Sit;Rolling Rolling: Min assist Sidelying to sit: Min assist       General bed mobility comments: cuing for technique and practice  Transfers Overall transfer level: Needs assistance Equipment used: Straight cane Transfers: Sit to/from Stand Sit to Stand: Mod assist;+2 safety/equipment         General transfer comment: cues for hand placement;  assist for both coming forward and lift.  Ambulation/Gait Ambulation/Gait assistance: Min assist;+2 safety/equipment Ambulation Distance (Feet): 10 Feet (x2 with cane, but needs RW) Assistive device: Rolling walker (2 wheeled) Gait Pattern/deviations: Step-through pattern;Decreased step length - right;Decreased step length - left Gait  velocity: slow   General Gait Details: mildly unsteady throughout with rubbery knees  Stairs            Wheelchair Mobility    Modified Rankin (Stroke Patients Only)       Balance Overall balance assessment: Needs assistance Sitting-balance support: No upper extremity supported;Feet supported Sitting balance-Leahy Scale: Fair       Standing balance-Leahy Scale: Fair Standing balance comment: can not accept challenge                             Pertinent Vitals/Pain Pain Assessment: 0-10 Pain Score: 3  Pain Location: incision Pain Descriptors / Indicators: Aching;Burning Pain Intervention(s): Monitored during session;Repositioned;Premedicated before session    Home Living Family/patient expects to be discharged to:: Private residence Living Arrangements: Parent (25 y/o Mom in relatively good health) Available Help at Discharge: Family Type of Home: House Home Access: Stairs to enter Entrance Stairs-Rails: Psychiatric nurse of Steps: 6 Home Layout: One level Home Equipment: Cane - single point;Walker - 2 wheels      Prior Function Level of Independence: Independent with assistive device(s)               Hand Dominance        Extremity/Trunk Assessment   Upper Extremity Assessment: Defer to OT evaluation           Lower Extremity Assessment: Generalized weakness (proximal weakness, quads 4/5)         Communication   Communication: No difficulties  Cognition Arousal/Alertness: Awake/alert Behavior During Therapy: WFL for tasks assessed/performed Overall Cognitive Status: Within Functional Limits for tasks assessed  General Comments General comments (skin integrity, edema, etc.): pt and son instructed in back care/precautions, log roll, bracing issues, lifting restrictions and typical progression of activity.    Exercises        Assessment/Plan    PT Assessment Patient needs  continued PT services  PT Diagnosis Difficulty walking;Generalized weakness;Acute pain   PT Problem List Decreased strength;Decreased activity tolerance;Decreased balance;Decreased mobility;Decreased knowledge of use of DME;Decreased knowledge of precautions  PT Treatment Interventions DME instruction;Gait training;Stair training;Functional mobility training;Therapeutic activities;Patient/family education   PT Goals (Current goals can be found in the Care Plan section) Acute Rehab PT Goals Patient Stated Goal: Home Independent PT Goal Formulation: With patient Time For Goal Achievement: 11/08/14 Potential to Achieve Goals: Good    Frequency Min 5X/week   Barriers to discharge        Co-evaluation               End of Session   Activity Tolerance: Patient tolerated treatment well Patient left: in chair;with call bell/phone within reach;with family/visitor present Nurse Communication: Mobility status         Time: 5329-9242 PT Time Calculation (min) (ACUTE ONLY): 36 min   Charges:   PT Evaluation $Initial PT Evaluation Tier I: 1 Procedure PT Treatments $Gait Training: 8-22 mins   PT G Codes:        Edward Duran, Tessie Fass 10/25/2014, 11:07 AM 10/25/2014  Donnella Sham, PT [phone]  (pager)

## 2014-10-26 DIAGNOSIS — J42 Unspecified chronic bronchitis: Secondary | ICD-10-CM

## 2014-10-26 DIAGNOSIS — M8458XA Pathological fracture in neoplastic disease, other specified site, initial encounter for fracture: Principal | ICD-10-CM

## 2014-10-26 DIAGNOSIS — I10 Essential (primary) hypertension: Secondary | ICD-10-CM

## 2014-10-26 DIAGNOSIS — R579 Shock, unspecified: Secondary | ICD-10-CM

## 2014-10-26 LAB — BASIC METABOLIC PANEL
ANION GAP: 7 (ref 5–15)
BUN: 10 mg/dL (ref 6–20)
CO2: 23 mmol/L (ref 22–32)
CREATININE: 1.04 mg/dL (ref 0.61–1.24)
Calcium: 7.6 mg/dL — ABNORMAL LOW (ref 8.9–10.3)
Chloride: 102 mmol/L (ref 101–111)
Glucose, Bld: 115 mg/dL — ABNORMAL HIGH (ref 65–99)
Potassium: 3.2 mmol/L — ABNORMAL LOW (ref 3.5–5.1)
Sodium: 132 mmol/L — ABNORMAL LOW (ref 135–145)

## 2014-10-26 LAB — TYPE AND SCREEN
ABO/RH(D): A POS
ANTIBODY SCREEN: NEGATIVE
UNIT DIVISION: 0
UNIT DIVISION: 0
UNIT DIVISION: 0
UNIT DIVISION: 0
UNIT DIVISION: 0
UNIT DIVISION: 0
Unit division: 0
Unit division: 0
Unit division: 0
Unit division: 0
Unit division: 0
Unit division: 0

## 2014-10-26 LAB — CBC WITH DIFFERENTIAL/PLATELET
BASOS ABS: 0 10*3/uL (ref 0.0–0.1)
Basophils Relative: 0 % (ref 0–1)
Eosinophils Absolute: 0.5 10*3/uL (ref 0.0–0.7)
Eosinophils Relative: 5 % (ref 0–5)
HEMATOCRIT: 25.6 % — AB (ref 39.0–52.0)
HEMOGLOBIN: 8.8 g/dL — AB (ref 13.0–17.0)
LYMPHS PCT: 8 % — AB (ref 12–46)
Lymphs Abs: 0.8 10*3/uL (ref 0.7–4.0)
MCH: 28.9 pg (ref 26.0–34.0)
MCHC: 34.4 g/dL (ref 30.0–36.0)
MCV: 84.2 fL (ref 78.0–100.0)
MONO ABS: 0.9 10*3/uL (ref 0.1–1.0)
MONOS PCT: 9 % (ref 3–12)
NEUTROS ABS: 8.1 10*3/uL — AB (ref 1.7–7.7)
NEUTROS PCT: 79 % — AB (ref 43–77)
Platelets: 112 10*3/uL — ABNORMAL LOW (ref 150–400)
RBC: 3.04 MIL/uL — ABNORMAL LOW (ref 4.22–5.81)
RDW: 16 % — AB (ref 11.5–15.5)
WBC: 10.3 10*3/uL (ref 4.0–10.5)

## 2014-10-26 LAB — GLUCOSE, CAPILLARY: GLUCOSE-CAPILLARY: 112 mg/dL — AB (ref 65–99)

## 2014-10-26 LAB — MAGNESIUM: Magnesium: 1.8 mg/dL (ref 1.7–2.4)

## 2014-10-26 LAB — PHOSPHORUS: PHOSPHORUS: 2.4 mg/dL — AB (ref 2.5–4.6)

## 2014-10-26 MED ORDER — SENNOSIDES-DOCUSATE SODIUM 8.6-50 MG PO TABS
2.0000 | ORAL_TABLET | Freq: Every day | ORAL | Status: DC
  Administered 2014-10-26 – 2014-10-27 (×2): 2 via ORAL
  Filled 2014-10-26 (×2): qty 2

## 2014-10-26 MED ORDER — MAGNESIUM SULFATE 2 GM/50ML IV SOLN
2.0000 g | Freq: Once | INTRAVENOUS | Status: AC
  Administered 2014-10-26: 2 g via INTRAVENOUS
  Filled 2014-10-26: qty 50

## 2014-10-26 MED ORDER — FLEET ENEMA 7-19 GM/118ML RE ENEM: 1.0000 | ENEMA | Freq: Every day | RECTAL | Status: DC | PRN

## 2014-10-26 MED ORDER — POTASSIUM CHLORIDE CRYS ER 20 MEQ PO TBCR
40.0000 meq | EXTENDED_RELEASE_TABLET | Freq: Once | ORAL | Status: AC
  Administered 2014-10-26: 40 meq via ORAL
  Filled 2014-10-26: qty 2

## 2014-10-26 MED ORDER — POLYETHYLENE GLYCOL 3350 17 G PO PACK
17.0000 g | PACK | Freq: Two times a day (BID) | ORAL | Status: DC
  Administered 2014-10-26 – 2014-10-28 (×4): 17 g via ORAL
  Filled 2014-10-26 (×4): qty 1

## 2014-10-26 MED ORDER — TAMSULOSIN HCL 0.4 MG PO CAPS
0.4000 mg | ORAL_CAPSULE | Freq: Every day | ORAL | Status: DC
Start: 2014-10-26 — End: 2014-10-28
  Administered 2014-10-26 – 2014-10-28 (×3): 0.4 mg via ORAL
  Filled 2014-10-26 (×3): qty 1

## 2014-10-26 MED ORDER — OXYCODONE HCL 5 MG PO TABS
5.0000 mg | ORAL_TABLET | ORAL | Status: DC | PRN
  Administered 2014-10-26 – 2014-10-27 (×2): 10 mg via ORAL
  Administered 2014-10-27 (×3): 5 mg via ORAL
  Administered 2014-10-27: 10 mg via ORAL
  Administered 2014-10-28: 5 mg via ORAL
  Filled 2014-10-26: qty 2
  Filled 2014-10-26 (×2): qty 1
  Filled 2014-10-26: qty 2
  Filled 2014-10-26: qty 1
  Filled 2014-10-26 (×2): qty 2

## 2014-10-26 NOTE — Progress Notes (Addendum)
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    PT Comments    Patient continues to demonstrate deficits in physical function and ability to mobilize and care for himself. In talking with patient, caregiver assist available is limited to 60 yo mother. Given patient need for physical assist, increased risk of falls in addition to initial history of falls, feel CIR is a good option to ensure patient reach independent levels with mobility to return home safely. Will continue see as indicated and progress as tolerated.   Follow Up Recommendations  CIR;Supervision/Assistance - 24 hour;Supervision for mobility/OOB     Equipment Recommendations   (?3 in 1)    Recommendations for Other Services       Precautions / Restrictions Precautions Precautions: Back;Fall Restrictions Weight Bearing Restrictions: No    Mobility  Bed Mobility Overal bed mobility: Needs Assistance Bed Mobility: Sidelying to Sit;Rolling Rolling: Min assist Sidelying to sit: Min assist       General bed mobility comments: cuing for technique and practice  Transfers Overall transfer level: Needs assistance Equipment used: Rolling walker (2 wheeled)   Sit to Stand: Mod assist;+2 safety/equipment         General transfer comment: cues for hand placement; poor carry over from previous  Ambulation/Gait Ambulation/Gait assistance: Mod assist;+2 physical assistance (increased assist when LE buckles and with increased fatigue >50  ft) Ambulation Distance (Feet): 80 Feet Assistive device: Rolling walker (2 wheeled) Gait Pattern/deviations: Step-through pattern;Decreased stride length;Drifts right/left Gait velocity: slow   General Gait Details: unsteady gait, decreased activity tolerance with increased WOB and DOE. Patient also with multiple occurances of RLE buckling during ambulation requiring increased moderate assist to maintain stability and prevent fall. Patient with history of multiple falls    Stairs            Wheelchair Mobility    Modified Rankin (Stroke Patients Only)       Balance Overall balance assessment: History of Falls   Sitting balance-Leahy Scale: Good       Standing balance-Leahy Scale: Fair                      Cognition Arousal/Alertness: Awake/alert Behavior During Therapy: WFL for tasks assessed/performed Overall Cognitive Status: Within Functional Limits for tasks assessed                      Exercises      General Comments General comments (skin integrity, edema, etc.): patient re0educated on precautions for mobility, patient with porr carry over from previous session.      Pertinent Vitals/Pain Pain Assessment: 0-10 Pain Score: 5  Pain Location: back Pain Descriptors / Indicators: Aching;Discomfort Pain Intervention(s): Limited activity within patient's tolerance;Monitored during session    [place] expects to be discharged to:: Private residence Living Arrangements: Parent (31 y/o Mom in relatively good health) Available Help at Discharge: Family Type of Home: House Home Access: Stairs to enter Entrance Stairs-Rails: Right;Left Home Layout: One level Home Equipment: Cane - single point;Walker - 2 wheels  Prior Function Level of Independence: Independent with assistive device(s)          PT Goals (current goals can now be found in the care plan section) Acute Rehab PT Goals Patient Stated Goal: Home  Independent PT Goal Formulation: With patient Time For Goal Achievement: 11/08/14 Potential to Achieve Goals: Good Progress towards PT goals: Progressing toward goals    Frequency  Min 5X/week    PT Plan Discharge plan needs to be updated    Co-evaluation PT/OT/SLP Co-Evaluation/Treatment: Yes Reason for Co-Treatment: For patient/therapist safety PT goals addressed during session: Mobility/safety with mobility OT goals addressed during session: ADL's and self-care     End of Session Equipment Utilized During Treatment: Gait belt Activity Tolerance: Patient tolerated treatment well Patient left: in chair;with call bell/phone within reach    Time: 1539-1605 PT Time Calculation (min) (ACUTE ONLY): 26 min  Charges:  $Gait Training: 8-22 mins                    G CodesDuncan Duran Nov 24, 2014, 5:13 PM Edward Duran, Edward Duran DPT  [phone]

## 2014-10-26 NOTE — Progress Notes (Signed)
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   Clinical Impression   PTA, pt had multiple falls. Mod I with ADL. Currently, pt requires mod A +2 for safe ambulation, mod A for LB ADL and is high risk for falls. Feel CIR is good option for pt to facilitate safe D/C home at mod I level and reduce risk for readmission. Will follow acutely to address established goals.     Follow Up Recommendations  Supervision/Assistance - 24 hour;CIR    Equipment Recommendations  Tub/shower bench;3 in 1 bedside comode    Recommendations for Other Services Rehab consult     Precautions / Restrictions Precautions Precautions: Back;Fall      Mobility Bed Mobility Overal bed mobility: Needs Assistance Bed Mobility: Sidelying to Sit;Rolling Rolling: Min assist Sidelying to sit: Min assist       General bed mobility comments: cuing for technique and practice  Transfers Overall transfer level: Needs assistance Equipment used: Rolling walker (2 wheeled)   Sit to Stand: Mod assist;+2 safety/equipment         General transfer comment: cues for hand placement; poor carry over from previous PT session    Balance Overall balance assessment: Needs assistance   Sitting balance-Leahy Scale: Good       Standing balance-Leahy Scale: Fair                              ADL Overall ADL's : Needs assistance/impaired     Grooming: Set up;Supervision/safety;Standing   Upper Body Bathing: Minimal  assitance;Sitting   Lower Body Bathing: Moderate assistance;Sit to/from stand   Upper Body Dressing : Minimal assistance;Sitting Upper Body Dressing Details (indicate cue type and reason): mod A for donning/doffing brace Lower Body Dressing: Moderate assistance;Sit to/from stand   Toilet Transfer: Moderate assistance;Ambulation (simulated)   Toileting- Clothing Manipulation and Hygiene: Total assistance Toileting - Clothing Manipulation Details (indicate cue type and reason): incontinent     Functional mobility during ADLs: +2 for physical assistance;+2 for safety/equipment;Moderate assistance General ADL Comments: +2 for safety and management of equipment. Mod A to power up from sitting     Vision     Perception     Praxis      Pertinent Vitals/Pain Pain Assessment: 0-10 Pain Score: 5  Pain Location: back Pain Descriptors / Indicators: Aching;Discomfort Pain Intervention(s): Limited activity within patient's tolerance;Monitored during session     Hand Dominance     Extremity/Trunk Assessment Upper Extremity Assessment Upper Extremity Assessment: Generalized weakness   Lower Extremity Assessment Lower Extremity Assessment: Defer to PT evaluation;Generalized weakness (R knee buckling)   Cervical / Trunk Assessment Cervical / Trunk Assessment: Kyphotic   Communication Communication Communication: No difficulties   Cognition Arousal/Alertness: Awake/alert Behavior During Therapy: WFL for tasks assessed/performed Overall Cognitive Status: Within Functional Limits for tasks assessed                     General Comments       Exercises       Shoulder  Instructions      Home Living Family/patient expects to be discharged to:: Private residence Living Arrangements: Parent (86 y/o Mom in relatively good health) Available Help at Discharge: Family Type of Home: House Home Access: Stairs to enter Technical brewer of Steps: 6 Entrance Stairs-Rails:  Right;Left Home Layout: One level     Bathroom Shower/Tub: Teacher, early years/pre: [place] - single point;Walker - 2 wheels          Prior Functioning/Environment Level of Independence: Independent with assistive device(s)             OT Diagnosis: Generalized weakness;Acute pain   OT Problem List: Decreased strength;Decreased range of motion;Decreased activity tolerance;Impaired balance (sitting and/or standing);Decreased safety awareness;Decreased knowledge of use of DME or AE;Decreased knowledge of precautions;Pain   OT Treatment/Interventions: Self-care/ADL training;Therapeutic exercise;DME and/or AE instruction;Energy conservation;Therapeutic activities;Patient/family education;Balance training    OT Goals(Current goals can be found in the care plan section) Acute Rehab OT Goals Patient Stated Goal: Home Independent OT Goal Formulation: With patient Time For Goal Achievement: 11/09/14 Potential to Achieve Goals: Good ADL Goals Pt Will Perform Lower Body Bathing: with adaptive equipment;sit to/from stand;with supervision Pt Will Perform Lower Body Dressing: with supervision;sit to/from stand;with adaptive equipment Pt Will Transfer to Toilet: with supervision;ambulating;bedside commode Pt Will Perform Toileting - Clothing Manipulation and hygiene: with supervision;sit to/from stand;with adaptive equipment Additional ADL Goal #1: Pt will demonstrate 3/3 back precautions during ADL with min vc Additional ADL Goal #2: Pt will be mod i with bed mobility for ADL   OT Frequency: Min 3X/week   Barriers to D/C: Decreased caregiver support  caregiver is 80 yo mother       Co-evaluation PT/OT/SLP Co-Evaluation/Treatment: Yes Reason for Co-Treatment: For patient/therapist safety   OT goals addressed during session: ADL's and self-care      End of Session Equipment Utilized During Treatment: Gait belt;Rolling walker;Back brace Nurse  Communication: Mobility status  Activity Tolerance: Patient tolerated treatment well Patient left: in chair;with call bell/phone within reach   Time: 1535-1605 OT Time Calculation (min): 30 min Charges:  OT General Charges $OT Visit: 1 Procedure OT Evaluation $Initial OT Evaluation Tier I: 1 Procedure G-Codes:    Edward Duran,Edward Duran 2014-11-10, 4:29 PM   Maurie Boettcher, OTR/L  [phone] 11-10-14

## 2014-10-26 NOTE — Progress Notes (Signed)
PATIENT DETAILS Name: Edward Duran Age: 59 y.o. 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 He was taken to OR 9/7 for repair and to ICU post-operatively on vent-extubated on 9/8.Post operative course complicated by acute blood loss anemia and circulatory shock. 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 No other complaints.  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 Voiding trial in next few days-start flomax  Active Problems: Lung Ca with complete collapse of right middle lobe: Bx as above. Oncology following.  Post op circulatory shock with acute blood loss anemia: received multiple PRBC/FFP/Platelet while in the ICU. Now BP and Hb stable.  HTN (hypertension):BP controlled-resume anti-hypertensive's in the next few days.Continue Nebs  COPD: no signs of exacerbation. Lungs clear  EPP:IRJJOA for outpatient work up/monitoring. Both lower ext warm to touch  Dyslipidemia: resume statins over the next few days  Hx of CVA:non focal exam  Disposition: Remain inpatient-Home health services on discharge-suspect 2-3 more days of hospitalization required  Antimicrobial agents  See below  Anti-infectives    Start     Dose/Rate Route Frequency Ordered Stop   10/23/14 1644  vancomycin (VANCOCIN) 1000 MG powder    Comments:  Loreli Dollar   : cabinet override      10/23/14 1644 10/24/14 0459   10/23/14 1445  vancomycin (VANCOCIN) powder  Status:  Discontinued       As needed 10/23/14 1449 10/23/14 1736   10/23/14 1441  vancomycin (VANCOCIN) 1000 MG powder    Comments:  Loreli Dollar   : cabinet override      10/23/14 1441 10/24/14 0259   10/23/14 1012  bacitracin 50,000 Units in sodium chloride irrigation 0.9 % 500 mL irrigation  Status:  Discontinued       As needed 10/23/14 1012 10/23/14 1736      DVT Prophylaxis: SCD's  Code Status: Full code   Family Communication None at bedside  Procedures: None  CONSULTS:  pulmonary/intensive care and Neurosurgery  Time spent 30 minutes-Greater than 50% of this time was spent in counseling, explanation of diagnosis, planning of further management, and coordination of care.  MEDICATIONS: Scheduled Meds: . sodium chloride  10 mL/hr Intravenous Once  . folic acid  1 mg Oral Daily  . ipratropium-albuterol  3 mL Nebulization Q6H  . lactose free nutrition  237 mL Oral TID BM  . multivitamin with minerals  1 tablet Oral Daily  . pantoprazole  40 mg Oral q1800  . polyethylene glycol  17 g Oral BID  . senna-docusate  2 tablet Oral QHS  . sodium chloride  3 mL Intravenous Q12H  . thiamine  100 mg Oral Daily   Continuous Infusions: . sodium chloride Stopped (10/25/14 0952)   PRN Meds:.[DISCONTINUED] acetaminophen **OR** acetaminophen, albuterol, fentaNYL (SUBLIMAZE) injection, hydrALAZINE, iohexol, oxyCODONE, sodium phosphate    PHYSICAL EXAM: Vital signs in last 24 hours: Filed Vitals:   10/25/14 2237 10/26/14 0258 10/26/14 0542 10/26/14 1020  BP: 112/56 124/69  132/65 143/63  Pulse: 101 92 89 97  Temp: 99.6 F (37.6 C) 99 F (37.2 C) 98.6 F (37 C) 98.7 F (37.1 C)  TempSrc: Oral Oral Oral Oral  Resp: '18 18 16 18  '$ Height:      Weight:      SpO2: 94% 95% 94% 97%    Weight change:  Filed Weights   10/21/14 1047 10/21/14 2219 10/23/14 1815  Weight: 58.968 kg (130 lb) 51 kg (112 lb 7 oz) 57.6 kg (126 lb 15.8  oz)   Body mass index is 20.51 kg/(m^2).   Gen Exam: Awake and alert with clear speech.  Neck: Supple, No JVD.   Chest: B/L Clear.   CVS: S1 S2 Regular, no murmurs.  Abdomen: soft, BS +, non tender, non distended.  Extremities: no edema, lower extremities warm to touch. Neurologic: Non Focal.   Skin: No Rash.   Wounds: N/A.   Intake/Output from previous day:  Intake/Output Summary (Last 24 hours) at 10/26/14 1203 Last data filed at 10/26/14 1022  Gross per 24 hour  Intake    317 ml  Output   2255 ml  Net  -1938 ml     LAB RESULTS: CBC  Recent Labs Lab 10/21/14 1130  10/23/14 1700 10/23/14 1725 10/23/14 1918 10/24/14 0226 10/25/14 0225 10/26/14 0525  WBC 11.2*  < > 8.3  --  7.5 8.5 11.5* 10.3  HGB 15.3  < > 11.3* 11.6* 11.8* 9.5* 9.1* 8.8*  HCT 44.2  < > 33.3* 34.0* 34.2* 27.5* 26.0* 25.6*  PLT 238  < > 62*  --  59* 125* 110* 112*  MCV 93.6  < > 85.2  --  84.0 82.8 84.1 84.2  MCH 32.4  < > 28.9  --  29.0 28.6 29.4 28.9  MCHC 34.6  < > 33.9  --  34.5 34.5 35.0 34.4  RDW 13.8  < > 15.6*  --  15.8* 16.3* 16.4* 16.0*  LYMPHSABS 1.6  --   --   --   --  0.8 0.7 0.8  MONOABS 0.8  --   --   --   --  0.9 1.0 0.9  EOSABS 0.6  --   --   --   --  0.1 0.2 0.5  BASOSABS 0.1  --   --   --   --  0.0 0.0 0.0  < > = values in this interval not displayed.  Chemistries   Recent Labs Lab 10/21/14 2255  10/23/14 0254  10/23/14 1229  10/23/14 1725 10/23/14 1918 10/24/14 0226 10/25/14 0225 10/26/14 0525  NA  --   < > 134*  < > 138  < > 140 139 138 135 132*  K  --   < > 3.4*  < > 3.7  < > 4.8 4.3 3.7 3.4* 3.2*  CL  --   < > 102  --   --   --   --  112* 111 107 102  CO2  --   < > 23  --   --   --   --  '24 22 23 23  '$ GLUCOSE  --   < > 127*  < > 119*  --   --  132* 121* 154* 115*  BUN  --   < > 29*  --   --   --   --  '15 15 12 10  '$ CREATININE  --   < > 1.56*  --   --   --   --  1.08 1.02 1.01 1.04  CALCIUM 9.3  < > 8.5*  --   --   --   --  7.9* 7.2* 7.5* 7.6*  MG 1.8  --   --    --   --   --   --  1.3* 2.2 1.9 1.8  < > = values in this interval not displayed.  CBG:  Recent Labs Lab 10/22/14 0750 10/23/14 0721 10/24/14 0817 10/25/14 0728 10/26/14 0652  GLUCAP 114* 105* 130* 113* 112*    GFR Estimated Creatinine Clearance: 62.3 mL/min (by C-G formula based on Cr of 1.04).  Coagulation profile  Recent Labs Lab 10/22/14 0330 10/23/14 1700 10/23/14 1918  INR 1.06 1.48 1.31    Cardiac Enzymes  Recent Labs Lab 10/23/14 1918 10/24/14 0226 10/24/14 1058  CKMB 16.7*  --   --   TROPONINI 0.03 0.04* 0.08*    Invalid input(s): POCBNP No results for input(s): DDIMER in the last 72 hours. No results for input(s): HGBA1C in the last 72 hours. No results for input(s): CHOL, HDL, LDLCALC, TRIG, CHOLHDL, LDLDIRECT in the last 72 hours. No results for input(s): TSH, T4TOTAL, T3FREE, THYROIDAB in the last 72 hours.  Invalid input(s): FREET3 No results for input(s): VITAMINB12, FOLATE, FERRITIN, TIBC, IRON, RETICCTPCT in the last 72 hours. No results for input(s): LIPASE, AMYLASE in the last 72 hours.  Urine Studies No results for input(s): UHGB, CRYS in the last 72 hours.  Invalid input(s): UACOL, UAPR, USPG, UPH, UTP, UGL, UKET, UBIL, UNIT, UROB, ULEU, UEPI, UWBC, URBC, UBAC, CAST, UCOM, BILUA  MICROBIOLOGY: Recent Results (from the past 240 hour(s))  MRSA PCR Screening     Status: None   Collection Time: 10/22/14  6:04 AM  Result Value Ref Range Status   MRSA by PCR NEGATIVE NEGATIVE Final    Comment:        The GeneXpert MRSA Assay (FDA approved for NASAL specimens only), is one component of a comprehensive MRSA colonization surveillance program. It is not intended to diagnose MRSA infection nor to guide or monitor treatment for MRSA infections.     RADIOLOGY STUDIES/RESULTS: Dg Lumbar Spine 2-3 Views  10/23/2014   CLINICAL DATA:  Lumbar spine surgery.  EXAM: DG C-ARM GT 120 MIN; LUMBAR SPINE - 2-3 VIEW  CONTRAST:  None.  FLUOROSCOPY  TIME:  Fluoroscopy Time (in minutes and seconds): 1 min 55 seconds  Number of Acquired Images:  3  COMPARISON:  CT 10/21/2014.  FINDINGS: L3 corpectomy and L1 through L5 posterior fusion. Hardware intact. Scoliosis concave right .  IMPRESSION: Postsurgical changes lumbar spine as above.   Electronically Signed   By: [place]   On: 10/23/2014 17:00   Ct Head W Wo Contrast  10/21/2014   EXAM: CT HEAD WITHOUT AND WITH CONTRAST  TECHNIQUE: Contiguous axial images were obtained from the base of the skull through the vertex without and with intravenous contrast  CONTRAST:  166m OMNIPAQUE IOHEXOL 300 MG/ML  SOLN  COMPARISON:  None.  FINDINGS: Encephalomalacia within the left frontal and parietal lobes, consistent with remote left MCA territory infarct. Scattered intracranial atherosclerotic cyst noted.  No acute large vessel territory infarct. No intracranial hemorrhage. No mass lesion, midline shift, or mass effect. No hydrocephalus. No abnormal enhancement.  Scalp soft tissues within normal limits. No acute abnormality about the orbits.  Paranasal sinuses and mastoid air cells are clear.  Calvarium intact.  No focal osseous lesion.  IMPRESSION: 1. No acute intracranial process.  No abnormal enhancement.  2. Remote left MCA territory infarct.   Electronically Signed   By: Jeannine Boga M.D.   On: 10/21/2014 23:25   Mr Thoracic Spine W Wo Contrast  10/21/2014   CLINICAL DATA:  Back pain.  Recent falls.  EXAM: MRI THORACIC AND LUMBAR SPINE WITHOUT AND WITH CONTRAST  TECHNIQUE: Multiplanar and multiecho pulse sequences of the thoracic and lumbar spine were obtained without and with intravenous contrast.  CONTRAST:  72m MULTIHANCE GADOBENATE DIMEGLUMINE 529 MG/ML IV SOLN  COMPARISON:  Lumbar radiographs 09/10/2014. No prior MRI for comparison.  FINDINGS: MR THORACIC SPINE FINDINGS  Negative for thoracic spine fracture. 1 cm lesion in the T10 vertebral body is low signal on T1 and T2 but does not enhance.  Given the findings lumbar spine, this may represent metastatic disease however could also be a benign lesion such as hemangioma. No other worrisome lesions in the thoracic spine.  No cord compression.  Spinal cord signal is normal.  No significant disc degeneration or disc protrusion  No enhancing lesions are seen postcontrast.  Right lower lobe lesion posteriorly measuring approximately 2 x 2.5 cm. There is right hilar adenopathy. Findings are worrisome for carcinoma of the lung. Chest CT recommended.  MR LUMBAR SPINE FINDINGS  Moderate levoscoliosis.  Distended urinary bladder.  Pathologic fracture of L2. There is extensive tumor throughout the L3 vertebral body extending to the right transverse process and posterior elements on the right. There is extensive tumor within the canal and right foramen at L3-4. Severe spinal stenosis with compression of the thecal sac due to tumor. This is likely the cause of the distended urinary bladder.  Conus medullaris normal. No other fracture or metastatic disease identified in the lumbar spine.  Moderate spinal stenosis L4-5 due to disc and facet degeneration.  IMPRESSION: Findings worrisome for lung cancer with right lower lobe density and right hilar adenopathy. CT chest with contrast recommended for further evaluation.  1 cm nonenhancing lesion T10 vertebral body, worrisome for metastatic disease. No thoracic cord compression or fracture.  Pathologic fracture of L3. Tumor extends into the posterior elements and transverse process on the right. There is extensive tumor in the canal causing severe spinal stenosis. Distended urinary bladder likely due to severe spinal stenosis and cauda equina syndrome.  Moderate spinal stenosis L4-5 due to degenerative change.  Critical Value/emergent results were called by telephone at the time of interpretation on 10/21/2014 at 6:54 pm to Dr. MTanna Furry, who verbally acknowledged these results.   Electronically Signed   By: CFranchot Gallo M.D.   On: 10/21/2014 18:56   Mr Lumbar Spine W Wo Contrast  10/21/2014   CLINICAL DATA:  Back pain.  Recent falls.  EXAM: MRI THORACIC AND LUMBAR SPINE WITHOUT AND WITH CONTRAST  TECHNIQUE: Multiplanar and multiecho pulse sequences of the thoracic and lumbar spine were obtained without and with intravenous contrast.  CONTRAST:  125mMULTIHANCE GADOBENATE DIMEGLUMINE 529 MG/ML IV SOLN  COMPARISON:  Lumbar radiographs 09/10/2014. No prior MRI for comparison.  FINDINGS: MR THORACIC SPINE FINDINGS  Negative for thoracic spine fracture. 1 cm lesion in the T10 vertebral body is low signal on T1 and T2 but does not enhance. Given the findings lumbar spine, this may represent metastatic disease however could also be a benign lesion such as hemangioma. No other worrisome lesions in the thoracic spine.  No cord compression.  Spinal cord signal is normal.  No significant disc degeneration or disc protrusion  No enhancing lesions are seen postcontrast.  Right lower lobe lesion posteriorly measuring approximately 2 x 2.5 cm. There is right hilar adenopathy. Findings are worrisome for carcinoma of the lung. Chest CT recommended.  MR LUMBAR SPINE FINDINGS  Moderate levoscoliosis.  Distended urinary bladder.  Pathologic fracture of L2. There is extensive tumor throughout the L3 vertebral body extending to the right transverse process and posterior elements on the right. There is extensive tumor within the canal and right foramen at L3-4. Severe spinal stenosis with compression of the thecal sac due to tumor. This is likely the cause of the distended urinary bladder.  Conus medullaris normal. No other fracture or metastatic disease identified in the lumbar spine.  Moderate spinal stenosis L4-5 due to disc and facet degeneration.  IMPRESSION: Findings worrisome for lung cancer with right lower lobe density and right hilar adenopathy. CT chest with contrast recommended for further evaluation.  1 cm nonenhancing lesion T10 vertebral  body, worrisome for metastatic disease. No thoracic cord compression or fracture.  Pathologic fracture of L3. Tumor extends into the posterior elements and transverse process on the right. There is extensive tumor in the canal causing severe spinal stenosis. Distended urinary bladder likely due to severe spinal stenosis and cauda equina syndrome.  Moderate spinal stenosis L4-5 due to degenerative change.  Critical Value/emergent results were called by telephone at the time of interpretation on 10/21/2014 at 6:54 pm to Dr. Tanna Furry , who verbally acknowledged these results.   Electronically Signed   By: Franchot Gallo M.D.   On: 10/21/2014 18:56   Ct Abdomen Pelvis W Contrast  10/21/2014   CLINICAL DATA:  59 year old male with chest mass presenting with recent fall. Earlier spine MRI demonstrated a mass with pathological fracture involving L3 vertebra.  EXAM: CT ABDOMEN AND PELVIS WITH CONTRAST  TECHNIQUE: Multidetector CT imaging of the abdomen and pelvis was performed using the standard protocol following bolus administration of intravenous contrast.  CONTRAST:  140m OMNIPAQUE IOHEXOL 300 MG/ML  SOLN  COMPARISON:  Spine MRI dated 10/21/2014  FINDINGS: There is consolidative changes of the visualized right middle lobe. Endobronchial material noted within the visualized right lower lobe bronchi. These findings may be sequela of central obstruction caused by known lung mass. No intra-abdominal free air or free fluid identified.  Small scattered hepatic calcifications noted, likely sequela of prior insult or infection. There are multiple stones within the gallbladder. No associated inflammatory changes or CT evidence of acute cholecystitis. The pancreas appears unremarkable. Multiple small calcified splenic granuloma noted. The adrenal glands appear unremarkable. There is moderate right renal atrophy. There bilateral renal vascular calcification. There is no hydronephrosis on either side. Two multiple bilateral  renal hypodense lesions most of which are too small to characterize the largest lesion is located in the inferior pole of the left kidney and measures 1.6 cm of and demonstrates the fluid attenuation most likely a cyst. Ultrasound may provide better characterization. The visualized ureters and urinary bladder appear unremarkable. The prostate gland is grossly unremarkable.  Oral contrast opacifies the stomach and multiple loops of small bowel. There is no evidence of bowel obstruction or inflammation. The appendix is not visualized with certainty. No inflammatory changes identified in the right lower quadrant.  The abdominal aorta is tortuous. There is aortoiliac atherosclerotic disease. There is a 3 cm partially thrombosed infrarenal abdominal aortic aneurysm. There is thrombosis of the right common iliac artery as well as thrombosis of the right internal and external iliac arteries. There is reconstitution of the flow within the right  femoral artery. The origins of the celiac axis, SMA appear patent. There are atherosclerotic calcifications of the renal artery ostia. No portal venous gas identified. There is no lymphadenopathy.  There is a 5.4 x 7.2 cm soft tissue mass involving the vertebral body, right pedicle, right transverse process and right posterior elements of the L3 vertebra. There is destructive changes and loss of vertebral body height and complete collapse of the right side of the L3 vertebra. There is associated lumbar levoscoliosis. There is extension of the mass into the central canal and neural foramina at L3. There is focal compression and narrowing of the thecal sac. These findings are better evaluated on the MRI.  IMPRESSION: Complete collapse of the visualized right middle lobe likely related to central obstruction by the known lung mass.  Cholelithiasis.  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 There is reconstitution of the flow in the right femoral artery. These findings appear to be chronic.  Large destructive soft tissue mass involving the L3 vertebral as seen on the earlier MRI and compatible with malignancy. There is associated pathologic fracture and complete collapse of the right side of the L3 vertebra.   Electronically Signed   By: Anner Crete M.D.   On: 10/21/2014 22:48   Dg Chest Port 1 View  10/24/2014   CLINICAL DATA:  Endotracheal tube removed  EXAM: PORTABLE CHEST - 1 VIEW  COMPARISON:  Chest x-ray of 09/10/2014  FINDINGS: No endotracheal tube is visualized. The lungs are not well aerated and there is more collapse of the right lower lobe and possibly right middle lobe. This may be due to a central endobronchial lesion, such as mucous plugging, and clinical correlation is recommended. The left lung is clear. Heart size is stable.  IMPRESSION: 1. Diminished aeration with collapse of the right middle lobe and possibly right lower lobe possibly due to a central endobronchial process as mucous plugging. Correlate clinically. 2. No endotracheal tube is seen.   Electronically Signed   By: Ivar Drape M.D.   On: 10/24/2014 12:17   Dg C-arm Gt 120 Min  10/23/2014   CLINICAL DATA:  Lumbar spine surgery.  EXAM: DG C-ARM GT 120 MIN; LUMBAR SPINE - 2-3 VIEW  CONTRAST:  None.  FLUOROSCOPY TIME:  Fluoroscopy Time (in minutes and seconds): 1 min 55 seconds  Number of Acquired Images:  3  COMPARISON:  CT 10/21/2014.  FINDINGS: L3 corpectomy and L1 through L5 posterior fusion. Hardware intact. Scoliosis concave right .  IMPRESSION: Postsurgical changes lumbar spine as above.   Electronically Signed   By: Marcello Moores  Register   On: 10/23/2014 17:00    Oren Binet, MD  [facility] Pager:[phone]  If 7PM-7AM, please contact night-coverage [email or web address] Password [email or web address] 10/26/2014, 12:03 PM   LOS: 5 days

## 2014-10-26 NOTE — Progress Notes (Signed)
Subjective: Patient reports having pain, but generally doing well  Objective: Vital signs in last 24 hours: Temp:  [98.1 F (36.7 C)-99.6 F (37.6 C)] 98.6 F (37 C) (09/10 0542) Pulse Rate:  [87-108] 89 (09/10 0542) Resp:  [16-24] 16 (09/10 0542) BP: (112-139)/(56-69) 132/65 mmHg (09/10 0542) SpO2:  [94 %-100 %] 94 % (09/10 0542)  Intake/Output from previous day: 09/09 0701 - 09/10 0700 In: 467 [P.O.:387] Out: 2205 [Urine:2050; Drains:155] Intake/Output this shift: Total I/O In: 317 [P.O.:237; Other:80] Out: 330 [Urine:250; Drains:80]  Physical Exam: Strength full in both legs.  Has been up in brace yesterday, currently resting in bed.  Lab Results:  Recent Labs  10/25/14 0225 10/26/14 0525  WBC 11.5* 10.3  HGB 9.1* 8.8*  HCT 26.0* 25.6*  PLT 110* 112*   BMET  Recent Labs  10/24/14 0226 10/25/14 0225  NA 138 135  K 3.7 3.4*  CL 111 107  CO2 22 23  GLUCOSE 121* 154*  BUN 15 12  CREATININE 1.02 1.01  CALCIUM 7.2* 7.5*    Studies/Results: Dg Chest Port 1 View  10/24/2014   CLINICAL DATA:  Endotracheal tube removed  EXAM: PORTABLE CHEST - 1 VIEW  COMPARISON:  Chest x-ray of 09/10/2014  FINDINGS: No endotracheal tube is visualized. The lungs are not well aerated and there is more collapse of the right lower lobe and possibly right middle lobe. This may be due to a central endobronchial lesion, such as mucous plugging, and clinical correlation is recommended. The left lung is clear. Heart size is stable.  IMPRESSION: 1. Diminished aeration with collapse of the right middle lobe and possibly right lower lobe possibly due to a central endobronchial process as mucous plugging. Correlate clinically. 2. No endotracheal tube is seen.   Electronically Signed   By: Ivar Drape M.D.   On: 10/24/2014 12:17    Assessment/Plan: Mobilize with PT.  Doing well.  Regular pain meds.  Bladder rest.  Continue drain (155 cc last 24 H).     LOS: 5 days    Peggyann Shoals,  MD 10/26/2014, 6:34 AM

## 2014-10-27 ENCOUNTER — Inpatient Hospital Stay (HOSPITAL_COMMUNITY): Payer: Medicaid Other

## 2014-10-27 DIAGNOSIS — J95821 Acute postprocedural respiratory failure: Secondary | ICD-10-CM

## 2014-10-27 DIAGNOSIS — J411 Mucopurulent chronic bronchitis: Secondary | ICD-10-CM

## 2014-10-27 LAB — BASIC METABOLIC PANEL
Anion gap: 7 (ref 5–15)
BUN: 10 mg/dL (ref 6–20)
CALCIUM: 7.8 mg/dL — AB (ref 8.9–10.3)
CHLORIDE: 104 mmol/L (ref 101–111)
CO2: 24 mmol/L (ref 22–32)
CREATININE: 1.12 mg/dL (ref 0.61–1.24)
GFR calc non Af Amer: >60 mL/min (ref >60)
Glucose, Bld: 117 mg/dL — ABNORMAL HIGH (ref 65–99)
Potassium: 3.5 mmol/L (ref 3.5–5.1)
Sodium: 135 mmol/L (ref 135–145)

## 2014-10-27 LAB — CBC
HEMATOCRIT: 26.3 % — AB (ref 39.0–52.0)
HEMOGLOBIN: 8.8 g/dL — AB (ref 13.0–17.0)
MCH: 28.7 pg (ref 26.0–34.0)
MCHC: 33.5 g/dL (ref 30.0–36.0)
MCV: 85.7 fL (ref 78.0–100.0)
Platelets: 134 10*3/uL — ABNORMAL LOW (ref 150–400)
RBC: 3.07 MIL/uL — ABNORMAL LOW (ref 4.22–5.81)
RDW: 15.8 % — AB (ref 11.5–15.5)
WBC: 7.5 10*3/uL (ref 4.0–10.5)

## 2014-10-27 LAB — GLUCOSE, CAPILLARY: GLUCOSE-CAPILLARY: 113 mg/dL — AB (ref 65–99)

## 2014-10-27 LAB — MAGNESIUM: Magnesium: 2.1 mg/dL (ref 1.7–2.4)

## 2014-10-27 NOTE — Progress Notes (Signed)
Rehab Admissions Coordinator Note:  Patient was screened by Retta Diones for appropriateness for an Inpatient Acute Rehab Consult.  At this time, an inpatient rehab consult has been ordered.  We will follow up once consult is completed by rehab MD.  Retta Diones 10/27/2014, 10:25 AM  I can be reached at [phone].

## 2014-10-27 NOTE — Progress Notes (Signed)
Overall patient appears stable. His pain is fairly well controlled. He states his legs are "coming along." He denies leg pain. He still has his Foley catheter in place. He moves his lower extremities well. He is being mobilized slowly.

## 2014-10-27 NOTE — Progress Notes (Signed)
PATIENT DETAILS Name: Edward Duran Age: 59 y.o. 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 He was taken to OR 9/7 for repair and to ICU post-operatively on vent-extubated on 9/8.Post operative course complicated by acute blood loss anemia and circulatory shock. 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 No other complaints. Still has a Foley catheter and drain in place.  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 Voiding trial in next few days-on flomax  Active Problems: Lung Ca with complete collapse of right middle lobe: Bx as above. Oncology following.  Post op circulatory shock with acute blood loss anemia: received multiple PRBC/FFP/Platelet while in the ICU. Now BP and Hb stable.  HTN (hypertension):BP controlled-resume anti-hypertensive's in the next few days if able  COPD: no signs of exacerbation. Lungs clear-Continue Nebs  HYI:FOYDXA for outpatient work up/monitoring. Both lower ext warm to touch  Dyslipidemia: resume statins over the next few days  Hx of CVA:non focal exam  Disposition: Remain inpatient-CIR when bed available  Antimicrobial agents  See below  Anti-infectives    Start     Dose/Rate Route Frequency Ordered Stop   10/23/14 1644  vancomycin (VANCOCIN) 1000 MG powder    Comments:  Loreli Dollar   : cabinet override      10/23/14 1644 10/24/14 0459   10/23/14  1445  vancomycin (VANCOCIN) powder  Status:  Discontinued       As needed 10/23/14 1449 10/23/14 1736   10/23/14 1441  vancomycin (VANCOCIN) 1000 MG powder    Comments:  Loreli Dollar   : cabinet override      10/23/14 1441 10/24/14 0259   10/23/14 1012  bacitracin 50,000 Units in sodium chloride irrigation 0.9 % 500 mL irrigation  Status:  Discontinued       As needed 10/23/14 1012 10/23/14 1736      DVT Prophylaxis: SCD's  Code Status: Full code   Family Communication None at bedside  Procedures: None  CONSULTS:  pulmonary/intensive care and Neurosurgery  Time spent 20 minutes-Greater than 50% of this time was spent in counseling, explanation of diagnosis, planning of further management, and coordination of care.  MEDICATIONS: Scheduled Meds: . sodium chloride  10 mL/hr Intravenous Once  . folic acid  1 mg Oral Daily  . ipratropium-albuterol  3 mL Nebulization Q6H  . lactose free nutrition  237 mL Oral TID BM  . multivitamin with minerals  1 tablet Oral Daily  . pantoprazole  40 mg Oral q1800  . polyethylene glycol  17 g Oral BID  . senna-docusate  2 tablet Oral QHS  . sodium chloride  3 mL Intravenous Q12H  . tamsulosin  0.4 mg Oral Daily  . thiamine  100 mg Oral Daily   Continuous Infusions: . sodium chloride Stopped (10/25/14 0952)   PRN Meds:.[DISCONTINUED] acetaminophen **OR** acetaminophen, albuterol, fentaNYL (SUBLIMAZE) injection, hydrALAZINE, iohexol, oxyCODONE, sodium phosphate    PHYSICAL EXAM: Vital signs in last 24 hours: Filed Vitals:  10/27/14 0837 10/27/14 1022 10/27/14 1443 10/27/14 1446  BP:  140/64 142/58   Pulse:  93 94   Temp:  98 F (36.7 C) 97.8 F (36.6 C)   TempSrc:  Oral Oral   Resp:  18 17   Height:      Weight:      SpO2: 96% 98% 98% 97%    Weight change:  Filed Weights   10/21/14 1047 10/21/14 2219 10/23/14 1815  Weight: 58.968 kg (130 lb) 51 kg (112 lb 7 oz) 57.6 kg (126 lb 15.8 oz)   Body mass index is 20.51  kg/(m^2).   Gen Exam: Awake and alert with clear speech.  Neck: Supple, No JVD.   Chest: B/L Clear.   CVS: S1 S2 Regular, no murmurs.  Abdomen: soft, BS +, non tender, non distended.  Extremities: no edema, lower extremities warm to touch. Neurologic: Non Focal.   Skin: No Rash.   Wounds: N/A.   Intake/Output from previous day:  Intake/Output Summary (Last 24 hours) at 10/27/14 1505 Last data filed at 10/27/14 0700  Gross per 24 hour  Intake      0 ml  Output    730 ml  Net   -730 ml     LAB RESULTS: CBC  Recent Labs Lab 10/21/14 1130  10/23/14 1918 10/24/14 0226 10/25/14 0225 10/26/14 0525 10/27/14 0650  WBC 11.2*  < > 7.5 8.5 11.5* 10.3 7.5  HGB 15.3  < > 11.8* 9.5* 9.1* 8.8* 8.8*  HCT 44.2  < > 34.2* 27.5* 26.0* 25.6* 26.3*  PLT 238  < > 59* 125* 110* 112* 134*  MCV 93.6  < > 84.0 82.8 84.1 84.2 85.7  MCH 32.4  < > 29.0 28.6 29.4 28.9 28.7  MCHC 34.6  < > 34.5 34.5 35.0 34.4 33.5  RDW 13.8  < > 15.8* 16.3* 16.4* 16.0* 15.8*  LYMPHSABS 1.6  --   --  0.8 0.7 0.8  --   MONOABS 0.8  --   --  0.9 1.0 0.9  --   EOSABS 0.6  --   --  0.1 0.2 0.5  --   BASOSABS 0.1  --   --  0.0 0.0 0.0  --   < > = values in this interval not displayed.  Chemistries   Recent Labs Lab 10/23/14 1918 10/24/14 0226 10/25/14 0225 10/26/14 0525 10/27/14 0650  NA 139 138 135 132* 135  K 4.3 3.7 3.4* 3.2* 3.5  CL 112* 111 107 102 104  CO2 '24 22 23 23 24  '$ GLUCOSE 132* 121* 154* 115* 117*  BUN '15 15 12 10 10  '$ CREATININE 1.08 1.02 1.01 1.04 1.12  CALCIUM 7.9* 7.2* 7.5* 7.6* 7.8*  MG 1.3* 2.2 1.9 1.8 2.1    CBG:  Recent Labs Lab 10/23/14 0721 10/24/14 0817 10/25/14 0728 10/26/14 0652 10/27/14 0753  GLUCAP 105* 130* 113* 112* 113*    GFR Estimated Creatinine Clearance: 57.9 mL/min (by C-G formula based on Cr of 1.12).  Coagulation profile  Recent Labs Lab 10/22/14 0330 10/23/14 1700 10/23/14 1918  INR 1.06 1.48 1.31    Cardiac Enzymes  Recent Labs Lab  10/23/14 1918 10/24/14 0226 10/24/14 1058  CKMB 16.7*  --   --   TROPONINI 0.03 0.04* 0.08*    Invalid input(s): POCBNP No results for input(s): DDIMER in the last 72 hours. No results for input(s): HGBA1C in the last 72 hours. No results for input(s): CHOL, HDL, LDLCALC, TRIG, CHOLHDL, LDLDIRECT in  the last 72 hours. No results for input(s): TSH, T4TOTAL, T3FREE, THYROIDAB in the last 72 hours.  Invalid input(s): FREET3 No results for input(s): VITAMINB12, FOLATE, FERRITIN, TIBC, IRON, RETICCTPCT in the last 72 hours. No results for input(s): LIPASE, AMYLASE in the last 72 hours.  Urine Studies No results for input(s): UHGB, CRYS in the last 72 hours.  Invalid input(s): UACOL, UAPR, USPG, UPH, UTP, UGL, UKET, UBIL, UNIT, UROB, ULEU, UEPI, UWBC, URBC, UBAC, CAST, UCOM, BILUA  MICROBIOLOGY: Recent Results (from the past 240 hour(s))  MRSA PCR Screening     Status: None   Collection Time: 10/22/14  6:04 AM  Result Value Ref Range Status   MRSA by PCR NEGATIVE NEGATIVE Final    Comment:        The GeneXpert MRSA Assay (FDA approved for NASAL specimens only), is one component of a comprehensive MRSA colonization surveillance program. It is not intended to diagnose MRSA infection nor to guide or monitor treatment for MRSA infections.     RADIOLOGY STUDIES/RESULTS: Dg Lumbar Spine 2-3 Views  10/23/2014   CLINICAL DATA:  Lumbar spine surgery.  EXAM: DG C-ARM GT 120 MIN; LUMBAR SPINE - 2-3 VIEW  CONTRAST:  None.  FLUOROSCOPY TIME:  Fluoroscopy Time (in minutes and seconds): 1 min 55 seconds  Number of Acquired Images:  3  COMPARISON:  CT 10/21/2014.  FINDINGS: L3 corpectomy and L1 through L5 posterior fusion. Hardware intact. Scoliosis concave right .  IMPRESSION: Postsurgical changes lumbar spine as above.   Electronically Signed   By: Elliott   On: 10/23/2014 17:00   Ct Head W Wo Contrast  10/21/2014   EXAM: CT HEAD WITHOUT AND WITH CONTRAST  TECHNIQUE: Contiguous  axial images were obtained from the base of the skull through the vertex without and with intravenous contrast  CONTRAST:  138m OMNIPAQUE IOHEXOL 300 MG/ML  SOLN  COMPARISON:  None.  FINDINGS: Encephalomalacia within the left frontal and parietal lobes, consistent with remote left MCA territory infarct. Scattered intracranial atherosclerotic cyst noted.  No acute large vessel territory infarct. No intracranial hemorrhage. No mass lesion, midline shift, or mass effect. No hydrocephalus. No abnormal enhancement.  Scalp soft tissues within normal limits. No acute abnormality about the orbits.  Paranasal sinuses and mastoid air cells are clear.  Calvarium intact.  No focal osseous lesion.  IMPRESSION: 1. No acute intracranial process.  No abnormal enhancement. 2. Remote left MCA territory infarct.   Electronically Signed   By: BJeannine BogaM.D.   On: 10/21/2014 23:25   Mr Thoracic Spine W Wo Contrast  10/21/2014   CLINICAL DATA:  Back pain.  Recent falls.  EXAM: MRI THORACIC AND LUMBAR SPINE WITHOUT AND WITH CONTRAST  TECHNIQUE: Multiplanar and multiecho pulse sequences of the thoracic and lumbar spine were obtained without and with intravenous contrast.  CONTRAST:  135mMULTIHANCE GADOBENATE DIMEGLUMINE 529 MG/ML IV SOLN  COMPARISON:  Lumbar radiographs 09/10/2014. No prior MRI for comparison.  FINDINGS: MR THORACIC SPINE FINDINGS  Negative for thoracic spine fracture. 1 cm lesion in the T10 vertebral body is low signal on T1 and T2 but does not enhance. Given the findings lumbar spine, this may represent metastatic disease however could also be a benign lesion such as hemangioma. No other worrisome lesions in the thoracic spine.  No cord compression.  Spinal cord signal is normal.  No significant disc degeneration or disc protrusion  No enhancing lesions are seen postcontrast.  Right lower lobe lesion posteriorly measuring approximately  2 x 2.5 cm. There is right hilar adenopathy. Findings are worrisome for  carcinoma of the lung. Chest CT recommended.  MR LUMBAR SPINE FINDINGS  Moderate levoscoliosis.  Distended urinary bladder.  Pathologic fracture of L2. There is extensive tumor throughout the L3 vertebral body extending to the right transverse process and posterior elements on the right. There is extensive tumor within the canal and right foramen at L3-4. Severe spinal stenosis with compression of the thecal sac due to tumor. This is likely the cause of the distended urinary bladder.  Conus medullaris normal. No other fracture or metastatic disease identified in the lumbar spine.  Moderate spinal stenosis L4-5 due to disc and facet degeneration.  IMPRESSION: Findings worrisome for lung cancer with right lower lobe density and right hilar adenopathy. CT chest with contrast recommended for further evaluation.  1 cm nonenhancing lesion T10 vertebral body, worrisome for metastatic disease. No thoracic cord compression or fracture.  Pathologic fracture of L3. Tumor extends into the posterior elements and transverse process on the right. There is extensive tumor in the canal causing severe spinal stenosis. Distended urinary bladder likely due to severe spinal stenosis and cauda equina syndrome.  Moderate spinal stenosis L4-5 due to degenerative change.  Critical Value/emergent results were called by telephone at the time of interpretation on 10/21/2014 at 6:54 pm to Dr. Tanna Furry , who verbally acknowledged these results.   Electronically Signed   By: Franchot Gallo M.D.   On: 10/21/2014 18:56   Mr Lumbar Spine W Wo Contrast  10/21/2014   CLINICAL DATA:  Back pain.  Recent falls.  EXAM: MRI THORACIC AND LUMBAR SPINE WITHOUT AND WITH CONTRAST  TECHNIQUE: Multiplanar and multiecho pulse sequences of the thoracic and lumbar spine were obtained without and with intravenous contrast.  CONTRAST:  75m MULTIHANCE GADOBENATE DIMEGLUMINE 529 MG/ML IV SOLN  COMPARISON:  Lumbar radiographs 09/10/2014. No prior MRI for comparison.   FINDINGS: MR THORACIC SPINE FINDINGS  Negative for thoracic spine fracture. 1 cm lesion in the T10 vertebral body is low signal on T1 and T2 but does not enhance. Given the findings lumbar spine, this may represent metastatic disease however could also be a benign lesion such as hemangioma. No other worrisome lesions in the thoracic spine.  No cord compression.  Spinal cord signal is normal.  No significant disc degeneration or disc protrusion  No enhancing lesions are seen postcontrast.  Right lower lobe lesion posteriorly measuring approximately 2 x 2.5 cm. There is right hilar adenopathy. Findings are worrisome for carcinoma of the lung. Chest CT recommended.  MR LUMBAR SPINE FINDINGS  Moderate levoscoliosis.  Distended urinary bladder.  Pathologic fracture of L2. There is extensive tumor throughout the L3 vertebral body extending to the right transverse process and posterior elements on the right. There is extensive tumor within the canal and right foramen at L3-4. Severe spinal stenosis with compression of the thecal sac due to tumor. This is likely the cause of the distended urinary bladder.  Conus medullaris normal. No other fracture or metastatic disease identified in the lumbar spine.  Moderate spinal stenosis L4-5 due to disc and facet degeneration.  IMPRESSION: Findings worrisome for lung cancer with right lower lobe density and right hilar adenopathy. CT chest with contrast recommended for further evaluation.  1 cm nonenhancing lesion T10 vertebral body, worrisome for metastatic disease. No thoracic cord compression or fracture.  Pathologic fracture of L3. Tumor extends into the posterior elements and transverse process on the right. There is  extensive tumor in the canal causing severe spinal stenosis. Distended urinary bladder likely due to severe spinal stenosis and cauda equina syndrome.  Moderate spinal stenosis L4-5 due to degenerative change.  Critical Value/emergent results were called by  telephone at the time of interpretation on 10/21/2014 at 6:54 pm to Dr. Tanna Furry , who verbally acknowledged these results.   Electronically Signed   By: Franchot Gallo M.D.   On: 10/21/2014 18:56   Ct Abdomen Pelvis W Contrast  10/21/2014   CLINICAL DATA:  59 year old male with chest mass presenting with recent fall. Earlier spine MRI demonstrated a mass with pathological fracture involving L3 vertebra.  EXAM: CT ABDOMEN AND PELVIS WITH CONTRAST  TECHNIQUE: Multidetector CT imaging of the abdomen and pelvis was performed using the standard protocol following bolus administration of intravenous contrast.  CONTRAST:  131m OMNIPAQUE IOHEXOL 300 MG/ML  SOLN  COMPARISON:  Spine MRI dated 10/21/2014  FINDINGS: There is consolidative changes of the visualized right middle lobe. Endobronchial material noted within the visualized right lower lobe bronchi. These findings may be sequela of central obstruction caused by known lung mass. No intra-abdominal free air or free fluid identified.  Small scattered hepatic calcifications noted, likely sequela of prior insult or infection. There are multiple stones within the gallbladder. No associated inflammatory changes or CT evidence of acute cholecystitis. The pancreas appears unremarkable. Multiple small calcified splenic granuloma noted. The adrenal glands appear unremarkable. There is moderate right renal atrophy. There bilateral renal vascular calcification. There is no hydronephrosis on either side. Two multiple bilateral renal hypodense lesions most of which are too small to characterize the largest lesion is located in the inferior pole of the left kidney and measures 1.6 cm of and demonstrates the fluid attenuation most likely a cyst. Ultrasound may provide better characterization. The visualized ureters and urinary bladder appear unremarkable. The prostate gland is grossly unremarkable.  Oral contrast opacifies the stomach and multiple loops of small bowel. There is no  evidence of bowel obstruction or inflammation. The appendix is not visualized with certainty. No inflammatory changes identified in the right lower quadrant.  The abdominal aorta is tortuous. There is aortoiliac atherosclerotic disease. There is a 3 cm partially thrombosed infrarenal abdominal aortic aneurysm. There is thrombosis of the right common iliac artery as well as thrombosis of the right internal and external iliac arteries. There is reconstitution of the flow within the right femoral artery. The origins of the celiac axis, SMA appear patent. There are atherosclerotic calcifications of the renal artery ostia. No portal venous gas identified. There is no lymphadenopathy.  There is a 5.4 x 7.2 cm soft tissue mass involving the vertebral body, right pedicle, right transverse process and right posterior elements of the L3 vertebra. There is destructive changes and loss of vertebral body height and complete collapse of the right side of the L3 vertebra. There is associated lumbar levoscoliosis. There is extension of the mass into the central canal and neural foramina at L3. There is focal compression and narrowing of the thecal sac. These findings are better evaluated on the MRI.  IMPRESSION: Complete collapse of the visualized right middle lobe likely related to central obstruction by the known lung mass.  Cholelithiasis.  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 There is reconstitution of the flow in the right femoral artery. These findings appear to be chronic.  Large destructive soft tissue mass involving the L3  vertebral as seen on the earlier MRI and compatible with malignancy. There is associated pathologic fracture and complete collapse of the right side of the L3 vertebra.   Electronically Signed   By: Anner Crete M.D.   On: 10/21/2014 22:48   Dg Chest Port 1 View  10/24/2014    CLINICAL DATA:  Endotracheal tube removed  EXAM: PORTABLE CHEST - 1 VIEW  COMPARISON:  Chest x-ray of 09/10/2014  FINDINGS: No endotracheal tube is visualized. The lungs are not well aerated and there is more collapse of the right lower lobe and possibly right middle lobe. This may be due to a central endobronchial lesion, such as mucous plugging, and clinical correlation is recommended. The left lung is clear. Heart size is stable.  IMPRESSION: 1. Diminished aeration with collapse of the right middle lobe and possibly right lower lobe possibly due to a central endobronchial process as mucous plugging. Correlate clinically. 2. No endotracheal tube is seen.   Electronically Signed   By: Ivar Drape M.D.   On: 10/24/2014 12:17   Dg C-arm Gt 120 Min  10/23/2014   CLINICAL DATA:  Lumbar spine surgery.  EXAM: DG C-ARM GT 120 MIN; LUMBAR SPINE - 2-3 VIEW  CONTRAST:  None.  FLUOROSCOPY TIME:  Fluoroscopy Time (in minutes and seconds): 1 min 55 seconds  Number of Acquired Images:  3  COMPARISON:  CT 10/21/2014.  FINDINGS: L3 corpectomy and L1 through L5 posterior fusion. Hardware intact. Scoliosis concave right .  IMPRESSION: Postsurgical changes lumbar spine as above.   Electronically Signed   By: Marcello Moores  Register   On: 10/23/2014 17:00    Oren Binet, MD  [facility] Pager:[phone]  If 7PM-7AM, please contact night-coverage [email or web address] Password [email or web address] 10/27/2014, 3:05 PM   LOS: 6 days

## 2014-10-28 ENCOUNTER — Inpatient Hospital Stay (HOSPITAL_COMMUNITY)
Admission: AD | Admit: 2014-10-28 | Discharge: 2014-11-06 | DRG: 560 | Disposition: A | Discharge status: Home / Self Care | Payer: Medicaid Other | Source: Intra-hospital | Attending: Physical Medicine & Rehabilitation | Admitting: Physical Medicine & Rehabilitation

## 2014-10-28 DIAGNOSIS — F17210 Nicotine dependence, cigarettes, uncomplicated: Secondary | ICD-10-CM | POA: Diagnosis present

## 2014-10-28 DIAGNOSIS — D62 Acute posthemorrhagic anemia: Secondary | ICD-10-CM | POA: Diagnosis present

## 2014-10-28 DIAGNOSIS — Z79899 Other long term (current) drug therapy: Secondary | ICD-10-CM | POA: Diagnosis not present

## 2014-10-28 DIAGNOSIS — J410 Simple chronic bronchitis: Secondary | ICD-10-CM

## 2014-10-28 DIAGNOSIS — F329 Major depressive disorder, single episode, unspecified: Secondary | ICD-10-CM | POA: Diagnosis present

## 2014-10-28 DIAGNOSIS — M545 Low back pain: Secondary | ICD-10-CM | POA: Diagnosis present

## 2014-10-28 DIAGNOSIS — M4800 Spinal stenosis, site unspecified: Secondary | ICD-10-CM | POA: Diagnosis present

## 2014-10-28 DIAGNOSIS — Z7951 Long term (current) use of inhaled steroids: Secondary | ICD-10-CM | POA: Diagnosis not present

## 2014-10-28 DIAGNOSIS — J449 Chronic obstructive pulmonary disease, unspecified: Secondary | ICD-10-CM | POA: Diagnosis present

## 2014-10-28 DIAGNOSIS — Z8673 Personal history of transient ischemic attack (TIA), and cerebral infarction without residual deficits: Secondary | ICD-10-CM | POA: Diagnosis not present

## 2014-10-28 DIAGNOSIS — G959 Disease of spinal cord, unspecified: Secondary | ICD-10-CM | POA: Diagnosis present

## 2014-10-28 DIAGNOSIS — M8448XD Pathological fracture, other site, subsequent encounter for fracture with routine healing: Principal | ICD-10-CM

## 2014-10-28 DIAGNOSIS — R339 Retention of urine, unspecified: Secondary | ICD-10-CM | POA: Diagnosis present

## 2014-10-28 DIAGNOSIS — F4321 Adjustment disorder with depressed mood: Secondary | ICD-10-CM | POA: Insufficient documentation

## 2014-10-28 DIAGNOSIS — I10 Essential (primary) hypertension: Secondary | ICD-10-CM | POA: Diagnosis present

## 2014-10-28 DIAGNOSIS — R5381 Other malaise: Secondary | ICD-10-CM

## 2014-10-28 DIAGNOSIS — K5900 Constipation, unspecified: Secondary | ICD-10-CM | POA: Diagnosis present

## 2014-10-28 DIAGNOSIS — M7989 Other specified soft tissue disorders: Secondary | ICD-10-CM

## 2014-10-28 DIAGNOSIS — F419 Anxiety disorder, unspecified: Secondary | ICD-10-CM | POA: Diagnosis present

## 2014-10-28 DIAGNOSIS — R918 Other nonspecific abnormal finding of lung field: Secondary | ICD-10-CM | POA: Diagnosis present

## 2014-10-28 DIAGNOSIS — M8458XA Pathological fracture in neoplastic disease, other specified site, initial encounter for fracture: Secondary | ICD-10-CM | POA: Diagnosis present

## 2014-10-28 LAB — GLUCOSE, CAPILLARY: GLUCOSE-CAPILLARY: 132 mg/dL — AB (ref 65–99)

## 2014-10-28 MED ORDER — ONDANSETRON HCL 4 MG/2ML IJ SOLN: 4.0000 mg | Freq: Four times a day (QID) | INTRAMUSCULAR | Status: DC | PRN

## 2014-10-28 MED ORDER — TAMSULOSIN HCL 0.4 MG PO CAPS
0.4000 mg | ORAL_CAPSULE | Freq: Every day | ORAL | Status: DC
  Administered 2014-10-29 – 2014-11-06 (×9): 0.4 mg via ORAL
  Filled 2014-10-28 (×9): qty 1

## 2014-10-28 MED ORDER — BOOST PLUS PO LIQD
237.0000 mL | Freq: Three times a day (TID) | ORAL | Status: DC
  Administered 2014-10-29 – 2014-11-02 (×6): 237 mL via ORAL
  Filled 2014-10-28 (×30): qty 237

## 2014-10-28 MED ORDER — IPRATROPIUM-ALBUTEROL 0.5-2.5 (3) MG/3ML IN SOLN
3.0000 mL | Freq: Four times a day (QID) | RESPIRATORY_TRACT | Status: DC
  Administered 2014-10-28 – 2014-10-29 (×4): 3 mL via RESPIRATORY_TRACT
  Filled 2014-10-28 (×3): qty 3

## 2014-10-28 MED ORDER — TAMSULOSIN HCL 0.4 MG PO CAPS: 0.4000 mg | ORAL_CAPSULE | Freq: Every day | ORAL | Status: DC

## 2014-10-28 MED ORDER — FOLIC ACID 1 MG PO TABS: 1.0000 mg | ORAL_TABLET | Freq: Every day | ORAL | Status: DC

## 2014-10-28 MED ORDER — ALBUTEROL SULFATE (2.5 MG/3ML) 0.083% IN NEBU: 2.5000 mg | INHALATION_SOLUTION | RESPIRATORY_TRACT | Status: DC | PRN

## 2014-10-28 MED ORDER — PANTOPRAZOLE SODIUM 40 MG PO TBEC
40.0000 mg | DELAYED_RELEASE_TABLET | Freq: Every day | ORAL | Status: DC
  Administered 2014-10-28 – 2014-11-05 (×9): 40 mg via ORAL
  Filled 2014-10-28 (×10): qty 1

## 2014-10-28 MED ORDER — ACETAMINOPHEN 325 MG PO TABS
325.0000 mg | ORAL_TABLET | ORAL | Status: DC | PRN
  Administered 2014-10-30: 650 mg via ORAL
  Filled 2014-10-28: qty 2

## 2014-10-28 MED ORDER — ONDANSETRON HCL 4 MG PO TABS: 4.0000 mg | ORAL_TABLET | Freq: Four times a day (QID) | ORAL | Status: DC | PRN

## 2014-10-28 MED ORDER — SENNOSIDES-DOCUSATE SODIUM 8.6-50 MG PO TABS
2.0000 | ORAL_TABLET | Freq: Every day | ORAL | Status: DC
  Administered 2014-10-28 – 2014-11-05 (×9): 2 via ORAL
  Filled 2014-10-28 (×9): qty 2

## 2014-10-28 MED ORDER — IPRATROPIUM-ALBUTEROL 0.5-2.5 (3) MG/3ML IN SOLN: 3.0000 mL | Freq: Four times a day (QID) | RESPIRATORY_TRACT | Status: DC

## 2014-10-28 MED ORDER — POLYETHYLENE GLYCOL 3350 17 G PO PACK: 17.0000 g | PACK | Freq: Two times a day (BID) | ORAL | Status: AC

## 2014-10-28 MED ORDER — ADULT MULTIVITAMIN W/MINERALS CH
1.0000 | ORAL_TABLET | Freq: Every day | ORAL | Status: DC
  Administered 2014-10-29 – 2014-11-06 (×9): 1 via ORAL
  Filled 2014-10-28 (×9): qty 1

## 2014-10-28 MED ORDER — OXYCODONE HCL 5 MG PO TABS
5.0000 mg | ORAL_TABLET | ORAL | Status: DC | PRN
  Administered 2014-10-29 (×3): 5 mg via ORAL
  Administered 2014-10-30: 10 mg via ORAL
  Administered 2014-10-30: 5 mg via ORAL
  Administered 2014-10-30 (×2): 10 mg via ORAL
  Administered 2014-10-30: 5 mg via ORAL
  Administered 2014-10-31 – 2014-11-01 (×3): 10 mg via ORAL
  Administered 2014-11-01 – 2014-11-02 (×2): 5 mg via ORAL
  Administered 2014-11-02 – 2014-11-03 (×2): 10 mg via ORAL
  Administered 2014-11-04 – 2014-11-05 (×4): 5 mg via ORAL
  Filled 2014-10-28 (×5): qty 1
  Filled 2014-10-28 (×2): qty 2
  Filled 2014-10-28: qty 1
  Filled 2014-10-28: qty 2
  Filled 2014-10-28 (×2): qty 1
  Filled 2014-10-28 (×2): qty 2
  Filled 2014-10-28: qty 1
  Filled 2014-10-28 (×2): qty 2
  Filled 2014-10-28 (×2): qty 1
  Filled 2014-10-28 (×2): qty 2

## 2014-10-28 MED ORDER — BOOST PLUS PO LIQD: 237.0000 mL | Freq: Three times a day (TID) | ORAL | Status: DC

## 2014-10-28 MED ORDER — SORBITOL 70 % SOLN
30.0000 mL | Freq: Every day | Status: DC | PRN
  Administered 2014-10-29 – 2014-10-30 (×2): 30 mL via ORAL
  Filled 2014-10-28 (×2): qty 30

## 2014-10-28 MED ORDER — VITAMIN B-1 100 MG PO TABS
100.0000 mg | ORAL_TABLET | Freq: Every day | ORAL | Status: DC
  Administered 2014-10-29 – 2014-11-06 (×9): 100 mg via ORAL
  Filled 2014-10-28 (×9): qty 1

## 2014-10-28 MED ORDER — SENNOSIDES-DOCUSATE SODIUM 8.6-50 MG PO TABS: 2.0000 | ORAL_TABLET | Freq: Every day | ORAL | Status: AC

## 2014-10-28 MED ORDER — POLYETHYLENE GLYCOL 3350 17 G PO PACK
17.0000 g | PACK | Freq: Two times a day (BID) | ORAL | Status: DC
  Administered 2014-10-28 – 2014-11-05 (×11): 17 g via ORAL
  Filled 2014-10-28 (×16): qty 1

## 2014-10-28 MED ORDER — FOLIC ACID 1 MG PO TABS
1.0000 mg | ORAL_TABLET | Freq: Every day | ORAL | Status: DC
Start: 2014-10-29 — End: 2014-11-06
  Administered 2014-10-29 – 2014-11-06 (×9): 1 mg via ORAL
  Filled 2014-10-28 (×9): qty 1

## 2014-10-28 MED ORDER — FLEET ENEMA 7-19 GM/118ML RE ENEM: 1.0000 | ENEMA | Freq: Every day | RECTAL | Status: DC | PRN

## 2014-10-28 NOTE — Discharge Summary (Signed)
PATIENT DETAILS Name: Edward Duran Age: 59 y.o. Sex: male Date of Birth: 02-19-1955 MRN: [id number]. Admitting Physician: Allyne Gee, MD PCP:No PCP Per Patient  Admit Date: 10/21/2014 Discharge date: 10/28/2014  Recommendations for Outpatient Follow-up:  1. Please and sure follow-up with Dr. Ancil Linsey at [facility] for oncology needs post discharge from [employer] 2. Foley catheter removed on 9/12-voiding trial in progress-if fails to void in the next few hours-may need Foley catheter reinserted. 3. Started on MiraLAX/senna-may need further optimization of bowel regimen.   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 COPD (chronic obstructive pulmonary disease)   . Hypertension   . Stroke   . Cancer   . Carotid artery disease     DISCHARGE MEDICATIONS: Current Discharge Medication List    START taking these medications   Details  albuterol (PROVENTIL) (2.5 MG/3ML) 0.083% nebulizer solution Take 3 mLs (2.5 mg total) by nebulization every 3 (three) hours as needed for wheezing or shortness of breath. Qty: 75 mL, Refills: 12    folic acid (FOLVITE) 1 MG tablet Take 1 tablet (1 mg total) by mouth daily.    ipratropium-albuterol (DUONEB) 0.5-2.5 (3) MG/3ML SOLN Take 3 mLs by nebulization every 6 (six) hours. Qty: 360 mL    lactose free nutrition (BOOST PLUS) LIQD Take 237 mLs by mouth 3 (three) times daily between meals. Refills: 0    polyethylene glycol (MIRALAX / GLYCOLAX) packet Take 17 g by mouth 2 (two) times daily. Qty:  14 each, Refills: 0    senna-docusate (SENOKOT-S) 8.6-50 MG per tablet Take 2 tablets by mouth at bedtime.    sodium phosphate (FLEET) 7-19 GM/118ML ENEM Place 133 mLs (1 enema total) rectally daily as needed for severe constipation. Refills: 0    tamsulosin (FLOMAX) 0.4 MG CAPS capsule Take 1 capsule (0.4 mg total) by mouth daily.      CONTINUE these medications which have NOT CHANGED   Details  budesonide-formoterol (SYMBICORT) 160-4.5 MCG/ACT inhaler Inhale 2 puffs into the lungs 2 (two) times daily.      STOP taking these medications     ezetimibe-simvastatin (VYTORIN) 10-20 MG per tablet      olmesartan-hydrochlorothiazide (BENICAR HCT) 20-12.5 MG per tablet         ALLERGIES:  No Known Allergies  BRIEF HPI:  See H&P, Labs, Consult and Test reports for all details in brief, patient is a 59 year old smoker who was admitted for lower extremity weakness, further evaluation revealed a pathological fracture of L3.  CONSULTATIONS:   pulmonary/intensive care, hematology/oncology and Neurosurgery  PERTINENT RADIOLOGIC STUDIES: Dg Lumbar Spine 2-3 Views  10/23/2014   CLINICAL DATA:  Lumbar spine surgery.  EXAM: DG C-ARM GT 120 MIN; LUMBAR SPINE - 2-3 VIEW  CONTRAST:  None.  FLUOROSCOPY TIME:  Fluoroscopy Time (in minutes and seconds): 1 min 55 seconds  Number of Acquired Images:  3  COMPARISON:  CT 10/21/2014.  FINDINGS: L3 corpectomy and L1 through L5 posterior fusion. Hardware intact. Scoliosis concave right .  IMPRESSION: Postsurgical changes  lumbar spine as above.   Electronically Signed   By: [place]   On: 10/23/2014 17:00   Ct Head W Wo Contrast  10/21/2014   EXAM: CT HEAD WITHOUT AND WITH CONTRAST  TECHNIQUE: Contiguous axial images were obtained from the base of the skull through the vertex without and with intravenous contrast  CONTRAST:  169m OMNIPAQUE IOHEXOL 300 MG/ML  SOLN  COMPARISON:  None.  FINDINGS: Encephalomalacia within the left frontal and parietal  lobes, consistent with remote left MCA territory infarct. Scattered intracranial atherosclerotic cyst noted.  No acute large vessel territory infarct. No intracranial hemorrhage. No mass lesion, midline shift, or mass effect. No hydrocephalus. No abnormal enhancement.  Scalp soft tissues within normal limits. No acute abnormality about the orbits.  Paranasal sinuses and mastoid air cells are clear.  Calvarium intact.  No focal osseous lesion.  IMPRESSION: 1. No acute intracranial process.  No abnormal enhancement. 2. Remote left MCA territory infarct.   Electronically Signed   By: BJeannine BogaM.D.   On: 10/21/2014 23:25   Ct Lumbar Spine Wo Contrast  10/28/2014   CLINICAL DATA:  New onset lumbar spine instability. Recent lumbar surgery for pathologic L3 compression fracture. Newly discovered lung mass.  EXAM: CT LUMBAR SPINE WITHOUT CONTRAST  TECHNIQUE: Multidetector CT imaging of the lumbar spine was performed without intravenous contrast administration. Multiplanar CT image reconstructions were also generated.  COMPARISON:  Lumbar spine MRI 10/21/2014. CT abdomen and pelvis 10/21/2014.  FINDINGS: There is mild lumbar levoscoliosis, less pronounced than on the preoperative CT. Alignment on sagittal images does not appear significantly changed.  Sequelae of interval L3 corpectomy are identified with cage placement for treatment of the previously described pathologic L3 compression fracture. The inferior aspect of the cage extends approximately 6 mm posterior to the margin of the L4 vertebral body. Destructive changes are again seen extending into the left aspect of the residual L3 vertebral body related to previously described tumor. Evaluation for residual epidural and paravertebral tumor is limited on this noncontrast CT. There has been wide posterior decompression at L3, with laminectomies at L2 as well. Two small caliber surgical drains are in place. Posterior fusion has been performed from L1-L5  with bilateral pedicle screws at L1, L2, L4, and L5. Screws and connecting rods appear intact. There is a new mildly displaced fracture of the right L4 transverse process.  There is no evidence of new lumbar compression fracture. Minimal anterior wedging of the T11 vertebral body is unchanged. 1 cm lesion seen in the T10 vertebral body on MRI is not clearly identified by CT. Evaluation of the spinal canal is limited on this noncontrast CT, particularly at the postoperative levels given associated streak artifact. Lumbar degenerative changes, including moderate spinal stenosis at L4-5, are better evaluated on recent MRI.  Right larger than left pleural effusions are partially visualized at the lung bases, much larger on the right than on the prior CT and new on the left. There is rather extensive atherosclerotic calcification of the abdominal aorta and its major branch vessels.  IMPRESSION: 1. Interval L3 corpectomy, posterior decompression, and L1-L5 posterior fusion for treatment of pathologic L3 compression fracture as above. Limited evaluation of the spinal canal and for residual tumor on this unenhanced CT. 2. Mildly displaced right L4 transverse process fracture. 3. New/increased pleural effusions, right larger than left.   Electronically Signed   By: ALogan BoresM.D.   On: 10/28/2014 08:13   Mr Thoracic Spine W Wo Contrast  10/21/2014   CLINICAL DATA:  Back pain.  Recent falls.  EXAM: MRI THORACIC AND LUMBAR SPINE WITHOUT AND WITH CONTRAST  TECHNIQUE: Multiplanar and multiecho pulse sequences of the thoracic and lumbar spine were obtained without and with intravenous contrast.  CONTRAST:  27m MULTIHANCE GADOBENATE DIMEGLUMINE 529 MG/ML IV SOLN  COMPARISON:  Lumbar radiographs 09/10/2014. No prior MRI for comparison.  FINDINGS: MR THORACIC SPINE FINDINGS  Negative for thoracic spine fracture. 1 cm lesion in the T10 vertebral body is low signal on T1 and T2 but does not enhance. Given the findings lumbar  spine, this may represent metastatic disease however could also be a benign lesion such as hemangioma. No other worrisome lesions in the thoracic spine.  No cord compression.  Spinal cord signal is normal.  No significant disc degeneration or disc protrusion  No enhancing lesions are seen postcontrast.  Right lower lobe lesion posteriorly measuring approximately 2 x 2.5 cm. There is right hilar adenopathy. Findings are worrisome for carcinoma of the lung. Chest CT recommended.  MR LUMBAR SPINE FINDINGS  Moderate levoscoliosis.  Distended urinary bladder.  Pathologic fracture of L2. There is extensive tumor throughout the L3 vertebral body extending to the right transverse process and posterior elements on the right. There is extensive tumor within the canal and right foramen at L3-4. Severe spinal stenosis with compression of the thecal sac due to tumor. This is likely the cause of the distended urinary bladder.  Conus medullaris normal. No other fracture or metastatic disease identified in the lumbar spine.  Moderate spinal stenosis L4-5 due to disc and facet degeneration.  IMPRESSION: Findings worrisome for lung cancer with right lower lobe density and right hilar adenopathy. CT chest with contrast recommended for further evaluation.  1 cm nonenhancing lesion T10 vertebral body, worrisome for metastatic disease. No thoracic cord compression or fracture.  Pathologic fracture of L3. Tumor extends into the posterior elements and transverse process on the right. There is extensive tumor in the canal causing severe spinal stenosis. Distended urinary bladder likely due to severe spinal stenosis and cauda equina syndrome.  Moderate spinal stenosis L4-5 due to degenerative change.  Critical Value/emergent results were called by telephone at the time of interpretation on 10/21/2014 at 6:54 pm to Dr. MTanna Furry, who verbally acknowledged these results.   Electronically Signed   By: CFranchot GalloM.D.   On: 10/21/2014 18:56    Mr Lumbar Spine W Wo Contrast  10/21/2014   CLINICAL DATA:  Back pain.  Recent falls.  EXAM: MRI THORACIC AND LUMBAR SPINE WITHOUT AND WITH CONTRAST  TECHNIQUE: Multiplanar and multiecho pulse sequences of the thoracic and lumbar spine were obtained without and with intravenous contrast.  CONTRAST:  145mMULTIHANCE GADOBENATE DIMEGLUMINE 529 MG/ML IV SOLN  COMPARISON:  Lumbar radiographs 09/10/2014. No prior MRI for comparison.  FINDINGS: MR THORACIC SPINE FINDINGS  Negative for thoracic spine fracture. 1 cm lesion in the T10 vertebral body is low signal on T1 and T2 but does not enhance. Given the findings lumbar spine, this may represent metastatic disease however could also be a benign lesion such as hemangioma. No other worrisome lesions in the thoracic spine.  No cord compression.  Spinal cord signal is normal.  No significant disc degeneration or disc protrusion  No enhancing lesions are seen postcontrast.  Right lower lobe lesion posteriorly measuring approximately 2 x 2.5 cm. There is right hilar adenopathy. Findings are worrisome for carcinoma of the lung. Chest CT recommended.  MR LUMBAR  SPINE FINDINGS  Moderate levoscoliosis.  Distended urinary bladder.  Pathologic fracture of L2. There is extensive tumor throughout the L3 vertebral body extending to the right transverse process and posterior elements on the right. There is extensive tumor within the canal and right foramen at L3-4. Severe spinal stenosis with compression of the thecal sac due to tumor. This is likely the cause of the distended urinary bladder.  Conus medullaris normal. No other fracture or metastatic disease identified in the lumbar spine.  Moderate spinal stenosis L4-5 due to disc and facet degeneration.  IMPRESSION: Findings worrisome for lung cancer with right lower lobe density and right hilar adenopathy. CT chest with contrast recommended for further evaluation.  1 cm nonenhancing lesion T10 vertebral body, worrisome for  metastatic disease. No thoracic cord compression or fracture.  Pathologic fracture of L3. Tumor extends into the posterior elements and transverse process on the right. There is extensive tumor in the canal causing severe spinal stenosis. Distended urinary bladder likely due to severe spinal stenosis and cauda equina syndrome.  Moderate spinal stenosis L4-5 due to degenerative change.  Critical Value/emergent results were called by telephone at the time of interpretation on 10/21/2014 at 6:54 pm to Dr. Tanna Furry , who verbally acknowledged these results.   Electronically Signed   By: Franchot Gallo M.D.   On: 10/21/2014 18:56   Ct Abdomen Pelvis W Contrast  10/21/2014   CLINICAL DATA:  59 year old male with chest mass presenting with recent fall. Earlier spine MRI demonstrated a mass with pathological fracture involving L3 vertebra.  EXAM: CT ABDOMEN AND PELVIS WITH CONTRAST  TECHNIQUE: Multidetector CT imaging of the abdomen and pelvis was performed using the standard protocol following bolus administration of intravenous contrast.  CONTRAST:  163m OMNIPAQUE IOHEXOL 300 MG/ML  SOLN  COMPARISON:  Spine MRI dated 10/21/2014  FINDINGS: There is consolidative changes of the visualized right middle lobe. Endobronchial material noted within the visualized right lower lobe bronchi. These findings may be sequela of central obstruction caused by known lung mass. No intra-abdominal free air or free fluid identified.  Small scattered hepatic calcifications noted, likely sequela of prior insult or infection. There are multiple stones within the gallbladder. No associated inflammatory changes or CT evidence of acute cholecystitis. The pancreas appears unremarkable. Multiple small calcified splenic granuloma noted. The adrenal glands appear unremarkable. There is moderate right renal atrophy. There bilateral renal vascular calcification. There is no hydronephrosis on either side. Two multiple bilateral renal hypodense lesions  most of which are too small to characterize the largest lesion is located in the inferior pole of the left kidney and measures 1.6 cm of and demonstrates the fluid attenuation most likely a cyst. Ultrasound may provide better characterization. The visualized ureters and urinary bladder appear unremarkable. The prostate gland is grossly unremarkable.  Oral contrast opacifies the stomach and multiple loops of small bowel. There is no evidence of bowel obstruction or inflammation. The appendix is not visualized with certainty. No inflammatory changes identified in the right lower quadrant.  The abdominal aorta is tortuous. There is aortoiliac atherosclerotic disease. There is a 3 cm partially thrombosed infrarenal abdominal aortic aneurysm. There is thrombosis of the right common iliac artery as well as thrombosis of the right internal and external iliac arteries. There is reconstitution of the flow within the right femoral artery. The origins of the celiac axis, SMA appear patent. There are atherosclerotic calcifications of the renal artery ostia. No portal venous gas identified. There is no lymphadenopathy.  There is a 5.4 x 7.2 cm soft tissue mass involving the vertebral body, right pedicle, right transverse process and right posterior elements of the L3 vertebra. There is destructive changes and loss of vertebral body height and complete collapse of the right side of the L3 vertebra. There is associated lumbar levoscoliosis. There is extension of the mass into the central canal and neural foramina at L3. There is focal compression and narrowing of the thecal sac. These findings are better evaluated on the MRI.  IMPRESSION: Complete collapse of the visualized right middle lobe likely related to central obstruction by the known lung mass.  Cholelithiasis.  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 There is  reconstitution of the flow in the right femoral artery. These findings appear to be chronic.  Large destructive soft tissue mass involving the L3 vertebral as seen on the earlier MRI and compatible with malignancy. There is associated pathologic fracture and complete collapse of the right side of the L3 vertebra.   Electronically Signed   By: Anner Crete M.D.   On: 10/21/2014 22:48   Dg Chest Port 1 View  10/24/2014   CLINICAL DATA:  Endotracheal tube removed  EXAM: PORTABLE CHEST - 1 VIEW  COMPARISON:  Chest x-ray of 09/10/2014  FINDINGS: No endotracheal tube is visualized. The lungs are not well aerated and there is more collapse of the right lower lobe and possibly right middle lobe. This may be due to a central endobronchial lesion, such as mucous plugging, and clinical correlation is recommended. The left lung is clear. Heart size is stable.  IMPRESSION: 1. Diminished aeration with collapse of the right middle lobe and possibly right lower lobe possibly due to a central endobronchial process as mucous plugging. Correlate clinically. 2. No endotracheal tube is seen.   Electronically Signed   By: Ivar Drape M.D.   On: 10/24/2014 12:17   Dg C-arm Gt 120 Min  10/23/2014   CLINICAL DATA:  Lumbar spine surgery.  EXAM: DG C-ARM GT 120 MIN; LUMBAR SPINE - 2-3 VIEW  CONTRAST:  None.  FLUOROSCOPY TIME:  Fluoroscopy Time (in minutes and seconds): 1 min 55 seconds  Number of Acquired Images:  3  COMPARISON:  CT 10/21/2014.  FINDINGS: L3 corpectomy and L1 through L5 posterior fusion. Hardware intact. Scoliosis concave right .  IMPRESSION: Postsurgical changes lumbar spine as above.   Electronically Signed   By: Marcello Moores  Register   On: 10/23/2014 17:00     PERTINENT LAB RESULTS: CBC:  Recent Labs  10/26/14 0525 10/27/14 0650  WBC 10.3 7.5  HGB 8.8* 8.8*  HCT 25.6* 26.3*  PLT 112* 134*   CMET CMP     Component Value Date/Time   NA 135 10/27/2014 0650   K 3.5 10/27/2014 0650   CL 104 10/27/2014  0650   CO2 24 10/27/2014 0650   GLUCOSE 117* 10/27/2014 0650   BUN 10 10/27/2014 0650   CREATININE 1.12 10/27/2014 0650   CALCIUM 7.8* 10/27/2014 0650   PROT 4.2* 10/23/2014 1918   ALBUMIN 2.9* 10/23/2014 1918   AST 29 10/23/2014 1918   ALT 13* 10/23/2014 1918   ALKPHOS 46 10/23/2014 1918   BILITOT 1.7* 10/23/2014 1918   GFRNONAA >60 10/27/2014 0650   GFRAA >60 10/27/2014 0650    GFR Estimated Creatinine Clearance: 57.9 mL/min (by C-G formula based on Cr of 1.12). No results for input(s): LIPASE, AMYLASE in the  last 72 hours. No results for input(s): CKTOTAL, CKMB, CKMBINDEX, TROPONINI in the last 72 hours. Invalid input(s): POCBNP No results for input(s): DDIMER in the last 72 hours. No results for input(s): HGBA1C in the last 72 hours. No results for input(s): CHOL, HDL, LDLCALC, TRIG, CHOLHDL, LDLDIRECT in the last 72 hours. No results for input(s): TSH, T4TOTAL, T3FREE, THYROIDAB in the last 72 hours.  Invalid input(s): FREET3 No results for input(s): VITAMINB12, FOLATE, FERRITIN, TIBC, IRON, RETICCTPCT in the last 72 hours. Coags: No results for input(s): INR in the last 72 hours.  Invalid input(s): PT Microbiology: Recent Results (from the past 240 hour(s))  MRSA PCR Screening     Status: None   Collection Time: 10/22/14  6:04 AM  Result Value Ref Range Status   MRSA by PCR NEGATIVE NEGATIVE Final    Comment:        The GeneXpert MRSA Assay (FDA approved for NASAL specimens only), is one component of a comprehensive MRSA colonization surveillance program. It is not intended to diagnose MRSA infection nor to guide or monitor treatment for MRSA infections.      BRIEF HOSPITAL COURSE:  Summary: 59 year old male with several month history of lower extremity weakness admitted for pathological fracture of lumbar vertebra due to neoplastic disease. He was taken to OR 9/7 for repair and to ICU post-operatively on vent-extubated on 9/8.Post operative course  complicated by acute blood loss anemia and circulatory shock. 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 Foley catheter removed, will voiding trial in progress- if not successful, will require replacement of the Foley catheter and urology consult in the next few weeks. Continue Flomax on discharge.   Active Problems: Lung Ca with complete collapse of right middle lobe: Bx as above. Oncology followed patient during this hospital course. Spoke with oncology-Dr.Gudena-who suggested that patient will require follow-up with Dr. Thornton Papas at [facility] on discharge from [place].  Post op circulatory shock with acute blood loss anemia: received multiple PRBC/FFP/Platelet while in the ICU. Now BP and Hb stable.  HTN (hypertension):BP controlled-that the use of any antihypertensives-resume when able  COPD: no signs of exacerbation. Lungs clear-Continue Nebs  VQM:GQQPYP for outpatient work up/monitoring. Both lower ext warm to touch  Dyslipidemia: resume statins over the next few days  Hx of CVA:non focal exam  TODAY-DAY OF DISCHARGE:  Subjective:   Edward Duran today has no headache,no chest abdominal pain,no new weakness tingling or numbness, feels much better   Objective:   Blood pressure 146/92, pulse 106, temperature 98.4 F (36.9 C), temperature source Oral, resp. rate 17, height '5\' 6"'$  (1.676 m), weight 57.6 kg (126 lb 15.8 oz), SpO2 98 %.  Intake/Output Summary (Last 24 hours) at 10/28/14 1100 Last data filed at 10/28/14 0643  Gross per 24 hour  Intake      3 ml  Output   2500  ml  Net  -2497 ml   Filed Weights   10/21/14 1047 10/21/14 2219 10/23/14 1815  Weight: 58.968 kg (130 lb) 51 kg (112 lb 7 oz) 57.6 kg (126 lb 15.8 oz)    Exam Awake Alert, Oriented *3, No new F.N  deficits, Normal affect [place].AT,PERRAL Supple Neck,No JVD, No cervical lymphadenopathy appriciated.  Symmetrical Chest wall movement, Good air movement bilaterally, CTAB RRR,No Gallops,Rubs or new Murmurs, No Parasternal Heave +ve B.Sounds, Abd Soft, Non tender, No organomegaly appriciated, No rebound -guarding or rigidity. No Cyanosis, Clubbing or edema, No new Rash or bruise  DISCHARGE CONDITION: Stable  DISPOSITION: CIR  DISCHARGE INSTRUCTIONS:    Activity:  As tolerated with Full fall precautions use walker/cane & assistance as needed  Get Medicines reviewed and adjusted: Please take all your medications with you for your next visit with your Primary MD  Please request your Primary MD to go over all hospital tests and procedure/radiological results at the follow up, please ask your Primary MD to get all Hospital records sent to his/her office.  If you experience worsening of your admission symptoms, develop shortness of breath, life threatening emergency, suicidal or homicidal thoughts you must seek medical attention immediately by calling 911 or calling your MD immediately  if symptoms less severe.  You must read complete instructions/literature along with all the possible adverse reactions/side effects for all the Medicines you take and that have been prescribed to you. Take any new Medicines after you have completely understood and accpet all the possible adverse reactions/side effects.   Do not drive when taking Pain medications.   Do not take more than prescribed Pain, Sleep and Anxiety Medications  Special Instructions: If you have smoked or chewed Tobacco  in the last 2 yrs please stop smoking, stop any regular Alcohol  and or any Recreational drug use.  Wear Seat belts  while driving.  Please note  You were cared for by a hospitalist during your hospital stay. Once you are discharged, your primary care physician will handle any further medical issues. Please note that NO REFILLS for any discharge medications will be authorized once you are discharged, as it is imperative that you return to your primary care physician (or establish a relationship with a primary care physician if you do not have one) for your aftercare needs so that they can reassess your need for medications and monitor your lab values.   Diet recommendation: Heart Healthy diet  Follow-up Information    Follow up with Molli Hazard, MD. Schedule an appointment as soon as possible for a visit in 2 weeks.   Specialties:  Hematology and Oncology, Oncology   Why:  after discharge from [facility] oncology follow up   Contact information:   [place] [id number] [phone]      Total Time spent on discharge equals  45 minutes.  SignedOren Binet 10/28/2014 11:00 AM

## 2014-10-28 NOTE — Progress Notes (Signed)
No acute events, some back pain AVSS Awake and alert 5/5 BLE Sensation intact to light touch Incision c/d/i CT LSP: Good hardware position, cage slightly posterior at the bottom but no significant canal stenosis Stable D/c drain Foley management at discretion of medicine

## 2014-10-28 NOTE — PMR Pre-admission (Signed)
PMR Admission Coordinator Pre-Admission Assessment  Patient: Edward Duran is an 59 y.o., male MRN: [id number] DOB: [birth year] Height: '5\' 6"'$  (167.6 cm) Weight: 57.6 kg (126 lb 15.8 oz)              Insurance Information HMO:     PPO:      PCP:      IPA:      80/20:      OTHER:  PRIMARY:    Self pay   Policy#:       Subscriber:  CM Name:       Phone#:      Fax#:  Pre-Cert#:       Employer:  Benefits:  Phone #:      Name:  Eff. 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 right handed male with history of hypertension, COPD, tobacco abuse. Patient lives with his 32 year old mother independent with a single-point cane sometimes requiring walker prior to admission. Presented 10/21/2014 with progressive low back pain 2 months and right lower extremity weakness as well as a 40 pound weight loss over the past few months. He denied bowel or bladder incontinence. X-ray/MRI and imaging revealed pathologic fracture of L3 with tumor invasion extending into the posterior elements and transverse process on the right. 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 In addition a nonenhancing lesion at L2 and T10 was seen suspicious for metastatic disease. An incidental right lower lobe lesion posteriorly measuring 2 x 2.5 cm with right hilar adenopathy noted. CT abdomen and pelvis remarkable for noted findings as well as complete collapse of the visualized right middle  lobe likely related to central obstruction by the known lung mass. CT of the head negative for intracranial malignancy.. Underwent laminectomy inferior L2, L3 superior L4 for decompression and tumor resection, L3 corpectomy with anterior fusion L2-L4, internal fixation with pedicle screws at L1, L2, L4, L5. 10/23/2014 per Dr. Cyndy Freeze. Pap cytology report pending. Hospital course pain management. Acute blood loss anemia patient did require 7 units packed red blood cells and 1 unit fresh frozen plasma in the OR. Postoperative respiratory failure requiring full ventilatory support and slowly weaned from mechanical support. Oncology services Dr. Lindi Adie consulted and await pathology report and plan of care and patient to follow-up with [facility]. Urinary retention with Flomax added. Physical therapy evaluation completed ongoing with recommendations of physical medicine rehabilitation consult. Patient was admitted for a comprehensive rehabilitation program      Past Medical History  Past Medical History  Diagnosis Date  . COPD (chronic obstructive pulmonary disease)   . Hypertension   . Stroke   . Cancer   . Carotid  artery disease     Family History  family history is not on file.  Prior Rehab/Hospitalizations:  Has the patient had major surgery during 100 days prior to admission? No; pt. Denies prior rehab.  Says he was hospitalized about 6 months ago, but cannot state clearly why he was hospitalized.  Current Medications   Current facility-administered medications:  .  0.9 %  sodium chloride infusion, , Intravenous, Continuous, Juanito Doom, MD, Stopped at 10/25/14 [phone] .  0.9 %  sodium chloride infusion, 10 mL/hr, Intravenous, Once, Kevan Ny Ditty, MD .  [DISCONTINUED] acetaminophen (TYLENOL) tablet 650 mg, 650 mg, Oral, Q6H PRN, 650 mg at 10/22/14 2112 **OR** acetaminophen (TYLENOL) suppository 650 mg, 650 mg, Rectal, Q6H PRN, Allyne Gee, MD .  albuterol (PROVENTIL) (2.5 MG/3ML) 0.083% nebulizer solution 2.5 mg, 2.5 mg, Nebulization, Q3H PRN, Corey Harold, NP .  fentaNYL (SUBLIMAZE) injection 12.5-25 mcg, 12.5-25 mcg, Intravenous, Q2H PRN, Juanito Doom, MD, 25 mcg at 00/93/81 8299 .  folic acid (FOLVITE) tablet 1 mg, 1 mg, Oral, Daily, Allyne Gee, MD, 1 mg at 10/28/14 1025 .  hydrALAZINE (APRESOLINE) injection 10-40 mg, 10-40 mg, Intravenous, Q4H PRN, Corey Harold, NP .  iohexol (OMNIPAQUE) 300 MG/ML solution 25 mL, 25 mL, Oral, Once PRN, Medication Radiologist, MD .  ipratropium-albuterol (DUONEB) 0.5-2.5 (3) MG/3ML nebulizer solution 3 mL, 3 mL, Nebulization, Q6H, Albertine Patricia, MD, 3 mL at 10/28/14 0812 .  lactose free nutrition (BOOST PLUS) liquid 237 mL, 237 mL, Oral, TID BM, Ardeen Garland, RD, 237 mL at 10/27/14 1000 .  multivitamin with minerals tablet 1 tablet, 1 tablet, Oral, Daily, Allyne Gee, MD, 1 tablet at 10/28/14 1025 .  oxyCODONE (Oxy IR/ROXICODONE) immediate release tablet 5-10 mg, 5-10 mg, Oral, Q4H PRN, Jonetta Osgood, MD, 5 mg at 10/28/14 0109 .  pantoprazole (PROTONIX) EC tablet 40 mg, 40 mg, Oral, q1800, Juanito Doom, MD, 40 mg at 10/27/14 1618 .  polyethylene glycol (MIRALAX / GLYCOLAX) packet 17 g, 17 g, Oral, BID, Jonetta Osgood, MD, 17 g at 10/28/14 1025 .  senna-docusate (Senokot-S) tablet 2 tablet, 2 tablet, Oral, QHS, Jonetta Osgood, MD, 2 tablet at 10/27/14 2121 .  sodium chloride 0.9 % injection 3 mL, 3 mL, Intravenous, Q12H, Allyne Gee, MD, 3 mL at 10/28/14 1025 .  sodium phosphate (FLEET) 7-19 GM/118ML enema 1 enema, 1 enema, Rectal, Daily PRN, Jonetta Osgood, MD .  tamsulosin [facility]) capsule 0.4 mg, 0.4 mg, Oral, Daily, Jonetta Osgood, MD, 0.4 mg at 10/28/14 1025 .  thiamine (VITAMIN B-1) tablet 100 mg, 100 mg, Oral, Daily, Allyne Gee, MD, 100 mg at 10/28/14 1025  Patients Current Diet: Diet regular Room service appropriate?: Yes; Fluid  consistency:: Thin  Precautions / Restrictions Precautions Precautions: Back, Fall Restrictions Weight Bearing Restrictions: No   Has the patient had 2 or more falls or a fall with injury in the past year?Yes  Pt. Reports 3-4 falls in the last month , stating "I hurt something new each time I feel". Denies fractures from falls, feels leg weakness caused the falls  Prior Activity Level Community (5-7x/wk): Pt. reports he goes out 4x/ week to grocery and convenience stores.  He drives his mother's car.    Home Assistive Devices / Equipment Home Assistive Devices/Equipment: Kasandra Knudsen (specify quad or straight) Home Equipment: Cane - single point, Walker - 2 wheels  Prior Device Use: Indicate devices/aids used by the patient  prior to current illness, exacerbation or injury? None of the above; reports using a cane most of time PTA  Prior Functional Level Prior Function Level of Independence: Independent with assistive device(s) Comments: Pt. reports he has felt bad over the last months and has not wanted to do much.    Self Care: Did the patient need help bathing, dressing, using the toilet or eating?  Independent  Indoor Mobility: Did the patient need assistance with walking from room to room (with or without device)? Independent  Stairs: Did the patient need assistance with internal or external stairs (with or without device)? Independent  Functional Cognition: Did the patient need help planning regular tasks such as shopping or remembering to take medications? Independent  Current Functional Level Cognition  Overall Cognitive Status: Within Functional Limits for tasks assessed Orientation Level: Oriented X4    Extremity Assessment (includes Sensation/Coordination)  Upper Extremity Assessment: Generalized weakness  Lower Extremity Assessment: Defer to PT evaluation, Generalized weakness (R knee buckling)    ADLs  Overall ADL's : Needs assistance/impaired Grooming: Set up,  Supervision/safety, Standing Upper Body Bathing: Minimal assitance, Sitting Lower Body Bathing: Moderate assistance, Sit to/from stand Upper Body Dressing : Minimal assistance, Sitting Upper Body Dressing Details (indicate cue type and reason): mod A for donning/doffing brace Lower Body Dressing: Moderate assistance, Sit to/from stand Toilet Transfer: Moderate assistance, Ambulation (simulated) Toileting- Clothing Manipulation and Hygiene: Total assistance Toileting - Clothing Manipulation Details (indicate cue type and reason): incontinent Functional mobility during ADLs: +2 for physical assistance, +2 for safety/equipment, Moderate assistance General ADL Comments: +2 for safety and managemtn of equipmetn. Mod A to power up from sitting    Mobility  Overal bed mobility: Needs Assistance Bed Mobility: Sidelying to Sit, Rolling Rolling: Min assist Sidelying to sit: Min assist General bed mobility comments: cuing for technique and practice    Transfers  Overall transfer level: Needs assistance Equipment used: Rolling walker (2 wheeled) Transfers: Sit to/from Stand Sit to Stand: Mod assist, +2 safety/equipment General transfer comment: cues for hand placement; poor carry over from previous    Ambulation / Gait / Stairs / Wheelchair Mobility  Ambulation/Gait Ambulation/Gait assistance: Min assist, +2 physical assistance (moderate assist when LE buckles) Ambulation Distance (Feet): 80 Feet Assistive device: Rolling walker (2 wheeled) Gait Pattern/deviations: Step-through pattern, Decreased stride length, Drifts right/left General Gait Details: unsteady gait, decreased activity tolerance with increased WOB and DOE. Patient also with multiple occurances of RLE buckling during ambulation requiring increased moderate assist to maintain stability and prevent fall. Patient with history of multiple falls  Gait velocity: slow    Posture / Balance Balance Overall balance assessment: History of  Falls Sitting-balance support: No upper extremity supported, Feet supported Sitting balance-Leahy Scale: Good Standing balance-Leahy Scale: Fair Standing balance comment: can not accept challenge    Special needs/care consideration BiPAP/CPAP  no Continuous Drip IV   no Dialysis  no        Life Vest   no Oxygen   no Special Bed   no      Skin  Pt. Denies any skin issues                               Bowel mgmt:  Last BM not documented in chart; pt. Cannot recall last BM Bladder mgmt:  Foley DC'd this am for voiding trials Diabetic mgmt  no     Previous Home Environment Living Arrangements:  (Aunt  also in the home) Available Help at Discharge: Family Type of Home: House Home Layout: One level Home Access: Stairs to enter Entrance Stairs-Rails: Right, Left Entrance Stairs-Number of Steps: 6 Bathroom Shower/Tub: Chiropodist: [place]: No  Discharge Living Setting Plans for Discharge Living Setting: Patient's home Type of Home at Discharge: House Discharge Home Layout: One level Discharge Home Access: Stairs to enter Entrance Stairs-Rails: Right, Left Entrance Stairs-Number of Steps: 4-5 per pt. Discharge Bathroom Shower/Tub: Tub/shower unit Discharge Bathroom Toilet: Standard Discharge Bathroom Accessibility: Yes How Accessible: Accessible via walker Does the patient have any problems obtaining your medications?: No  Social/Family/Support Systems Patient Roles: Parent (pt. has an adult son) Anticipated Caregiver:  pt's Mom, Edward Duran Anticipated Caregiver's Contact Information: Edward Duran, [phone] Ability/Limitations of Caregiver: pt. reports she is in "[employer] [employer] [facility]" Caregiver Availability: Intermittent Discharge Plan Discussed with Primary Caregiver: No (pt. had mod I goals) Does Caregiver/Family have Issues with Lodging/Transportation while Pt is in Rehab?: No   Goals/Additional Needs Patient/Family Goal for  Rehab: mod I PT/OT; n/a SLP Expected length of stay: 10-12 days Cultural Considerations: none Dietary Needs: regular diet, thin liquids Equipment Needs: TBA Additional Information: Per. Dr. Sloan Leiter, pt. will need to follow up with oncologist in [place] post rehab Pt/Family Agrees to Admission and willing to participate: Yes Program Orientation Provided & Reviewed with Pt/Caregiver Including Roles  & Responsibilities: Yes   Decrease burden of Care through IP rehab admission: no   Possible need for SNF placement upon discharge:  Not anticipated   Patient Condition: This patient's condition remains as documented in the consult dated 10/28/14 , in which the Rehabilitation Physician determined and documented that the patient's condition is appropriate for intensive rehabilitative care in an inpatient rehabilitation facility. Will admit to inpatient rehab today.  Preadmission Screen Completed By:  Gerlean Ren, 10/28/2014 1:09 PM ______________________________________________________________________   Discussed status with Dr. Naaman Plummer on 10/28/14 at  1308  and received telephone approval for admission today.  Admission Coordinator:  Gerlean Ren, time 2703 /Date 10/28/14

## 2014-10-28 NOTE — Interval H&P Note (Signed)
Edward Duran was admitted today to Inpatient Rehabilitation with the diagnosis of LE compression fracture.  The patient's history has been reviewed, patient examined, and there is no change in status.  Patient continues to be appropriate for intensive inpatient rehabilitation.  I have reviewed the patient's chart and labs.  Questions were answered to the patient's satisfaction. The PAPE has been reviewed and assessment remains appropriate.  Edward Duran T 10/28/2014, 6:28 PM

## 2014-10-28 NOTE — Consult Note (Signed)
Physical Medicine and Rehabilitation Consult Reason for Consult: Pathologic fracture L3, extradural mass, spinal stenosis with radiculopathy Referring Physician: [facility]   HPI: Edward Duran is a 59 y.o. right handed male with history of hypertension, COPD, tobacco abuse. Patient lives with his 66 year old mother independent with a single-point cane sometimes requiring walker prior to admission. Presented 10/21/2014 with progressive low back pain 2 months and right lower extremity weakness as well as a 40 pound weight loss over the past few months. He denied bowel or bladder incontinence. X-ray/MRI and imaging revealed pathologic fracture of L3 with tumor invasion extending into the posterior elements and transverse process on the right. 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 In addition a nonenhancing lesion at L2 and T10 was seen suspicious for metastatic disease. An incidental right lower lobe lesion posteriorly measuring 2 x 2.5 cm with right hilar adenopathy noted. CT abdomen and pelvis remarkable for noted findings as well as complete collapse of the visualized right middle lobe likely related to central obstruction by the known lung mass. CT of the head negative for intracranial malignancy.. Underwent laminectomy inferior L2, L3 superior L4 for decompression and tumor resection, L3 corpectomy with anterior fusion L2-L4, internal fixation with pedicle screws at L1, L2, L4, L5. 10/23/2014 per Dr. Cyndy Freeze. Pap cytology report pending. Hospital course pain management. Acute blood loss anemia patient did require 7 units packed red blood cells and 1 unit fresh frozen plasma in the OR. Postoperative respiratory failure requiring full ventilatory support and slowly weaned from mechanical support. Oncology services Dr. Lindi Adie consulted and await plan of care. Urinary retention with Flomax added. Physical therapy evaluation completed ongoing with  recommendations of physical medicine rehabilitation consult.   Review of Systems  Constitutional: Negative for fever and chills.  HENT: Negative for hearing loss.   Eyes: Negative for blurred vision and double vision.  Respiratory: Positive for cough.        Shortness of breath with heavy exertion  Cardiovascular: Negative for chest pain, palpitations and leg swelling.  Gastrointestinal: Positive for nausea, vomiting and constipation. Negative for abdominal pain.  Genitourinary: Positive for urgency. Negative for hematuria.  Musculoskeletal: Positive for myalgias and back pain.       Right lower extremity weakness  Skin: Negative for rash.  Neurological: Positive for weakness. Negative for dizziness, tingling, seizures and headaches.   Past Medical History  Diagnosis Date  . COPD (chronic obstructive pulmonary disease)   . Hypertension   . Stroke   . Cancer   . Carotid artery disease    History reviewed. No pertinent past surgical history. History reviewed. No pertinent family history. Social History:  reports that he has been smoking.  He does not have any smokeless tobacco history on file. He reports that he uses illicit drugs. He reports that he does not drink alcohol. Allergies: No Known Allergies Medications Prior to Admission  Medication Sig Dispense Refill  . budesonide-formoterol (SYMBICORT) 160-4.5 MCG/ACT inhaler Inhale 2 puffs into the lungs 2 (two) times daily.    Marland Kitchen ezetimibe-simvastatin (VYTORIN) 10-20 MG per tablet Take 1 tablet by mouth daily.    Marland Kitchen olmesartan-hydrochlorothiazide (BENICAR HCT) 20-12.5 MG per tablet Take 1 tablet by mouth daily.      Home: Home Living Family/patient expects to be discharged to:: Private residence Living Arrangements: Parent (31 y/o Mom in relatively good health) Available Help at Discharge: Family Type of Home: House Home Access: Stairs to enter Entrance  Stairs-Number of Steps: 6 Entrance Stairs-Rails: Right, Left Home  Layout: One level Bathroom Shower/Tub: Chiropodist: Standard Home Equipment: Cane - single point, Environmental consultant - 2 wheels  Functional History: Prior Function Level of Independence: Independent with assistive device(s) Functional Status:  Mobility: Bed Mobility Overal bed mobility: Needs Assistance Bed Mobility: Sidelying to Sit, Rolling Rolling: Min assist Sidelying to sit: Min assist General bed mobility comments: cuing for technique and practice Transfers Overall transfer level: Needs assistance Equipment used: Rolling walker (2 wheeled) Transfers: Sit to/from Stand Sit to Stand: Mod assist, +2 safety/equipment General transfer comment: cues for hand placement; poor carry over from previous Ambulation/Gait Ambulation/Gait assistance: Min assist, +2 physical assistance (moderate assist when LE buckles) Ambulation Distance (Feet): 80 Feet Assistive device: Rolling walker (2 wheeled) Gait Pattern/deviations: Step-through pattern, Decreased stride length, Drifts right/left General Gait Details: unsteady gait, decreased activity tolerance with increased WOB and DOE. Patient also with multiple occurances of RLE buckling during ambulation requiring increased moderate assist to maintain stability and prevent fall. Patient with history of multiple falls  Gait velocity: slow    ADL: ADL Overall ADL's : Needs assistance/impaired Grooming: Set up, Supervision/safety, Standing Upper Body Bathing: Minimal assitance, Sitting Lower Body Bathing: Moderate assistance, Sit to/from stand Upper Body Dressing : Minimal assistance, Sitting Upper Body Dressing Details (indicate cue type and reason): mod A for donning/doffing brace Lower Body Dressing: Moderate assistance, Sit to/from stand Toilet Transfer: Moderate assistance, Ambulation (simulated) Toileting- Clothing Manipulation and Hygiene: Total assistance Toileting - Clothing Manipulation Details (indicate cue type and reason):  incontinent Functional mobility during ADLs: +2 for physical assistance, +2 for safety/equipment, Moderate assistance General ADL Comments: +2 for safety and managemtn of equipmetn. Mod A to power up from sitting  Cognition: Cognition Overall Cognitive Status: Within Functional Limits for tasks assessed Orientation Level: Oriented X4 Cognition Arousal/Alertness: Awake/alert Behavior During Therapy: WFL for tasks assessed/performed Overall Cognitive Status: Within Functional Limits for tasks assessed  Blood pressure 129/60, pulse 96, temperature 98.2 F (36.8 C), temperature source Oral, resp. rate 17, height '5\' 6"'$  (1.676 m), weight 57.6 kg (126 lb 15.8 oz), SpO2 97 %. Physical Exam  Constitutional: He is oriented to person, place, and time.  59 year old male appearing older than stated age  HENT:  Head: Normocephalic.  Eyes: EOM are normal.  Neck: Normal range of motion. Neck supple. No thyromegaly present.  Cardiovascular: Normal rate and regular rhythm.   Respiratory: Effort normal and breath sounds normal. No respiratory distress.  GI: Soft. Bowel sounds are normal. He exhibits no distension.  Neurological: He is alert and oriented to person, place, and time.  UE 4/5. LE: 3+hf. 4-ke and ad/apf. No sensory findings. Limited by back pain. Cognitively was intact.   Skin:  Back incision is dressed    Results for orders placed or performed during the hospital encounter of 10/21/14 (from the past 24 hour(s))  CBC     Status: Abnormal   Collection Time: 10/27/14  6:50 AM  Result Value Ref Range   WBC 7.5 4.0 - 10.5 K/uL   RBC 3.07 (L) 4.22 - 5.81 MIL/uL   Hemoglobin 8.8 (L) 13.0 - 17.0 g/dL   HCT 26.3 (L) 39.0 - 52.0 %   MCV 85.7 78.0 - 100.0 fL   MCH 28.7 26.0 - 34.0 pg   MCHC 33.5 30.0 - 36.0 g/dL   RDW 15.8 (H) 11.5 - 15.5 %   Platelets 134 (L) 150 - 400 K/uL  Basic metabolic panel  Status: Abnormal   Collection Time: 10/27/14  6:50 AM  Result Value Ref Range   Sodium  135 135 - 145 mmol/L   Potassium 3.5 3.5 - 5.1 mmol/L   Chloride 104 101 - 111 mmol/L   CO2 24 22 - 32 mmol/L   Glucose, Bld 117 (H) 65 - 99 mg/dL   BUN 10 6 - 20 mg/dL   Creatinine, Ser 1.12 0.61 - 1.24 mg/dL   Calcium 7.8 (L) 8.9 - 10.3 mg/dL   GFR calc non Af Amer >60 >60 mL/min   GFR calc Af Amer >60 >60 mL/min   Anion gap 7 5 - 15  Magnesium     Status: None   Collection Time: 10/27/14  6:50 AM  Result Value Ref Range   Magnesium 2.1 1.7 - 2.4 mg/dL  Glucose, capillary     Status: Abnormal   Collection Time: 10/27/14  7:53 AM  Result Value Ref Range   Glucose-Capillary 113 (H) 65 - 99 mg/dL   No results found.  Assessment/Plan: Diagnosis: pathological L3 fracture due to lung mass.  1. Does the need for close, 24 hr/day medical supervision in concert with the patient's rehab needs make it unreasonable for this patient to be served in a less intensive setting? Yes 2. Co-Morbidities requiring supervision/potential complications: copd, 3. Due to bladder management, bowel management, safety, skin/wound care, disease management, medication administration, pain management and patient education, does the patient require 24 hr/day rehab nursing? Yes 4. Does the patient require coordinated care of a physician, rehab nurse, PT (1-2 hrs/day, 5 days/week) and OT (1-2 hrs/day, 5 days/week) to address physical and functional deficits in the context of the above medical diagnosis(es)? Yes Addressing deficits in the following areas: balance, endurance, locomotion, strength, transferring, bowel/bladder control, bathing, dressing, feeding, grooming, toileting and psychosocial support 5. Can the patient actively participate in an intensive therapy program of at least 3 hrs of therapy per day at least 5 days per week? Yes 6. The potential for patient to make measurable gains while on inpatient rehab is excellent 7. Anticipated functional outcomes upon discharge from inpatient rehab are modified  independent  with PT, modified independent with OT, n/a with SLP. 8. Estimated rehab length of stay to reach the above functional goals is: 10-12 days 9. Does the patient have adequate social supports and living environment to accommodate these discharge functional goals? Potentially 10. Anticipated D/C setting: Home 11. Anticipated post D/C treatments: HH therapy and Outpatient therapy 12. Overall Rehab/Functional Prognosis: excellent  RECOMMENDATIONS: This patient's condition is appropriate for continued rehabilitative care in the following setting: CIR Patient has agreed to participate in recommended program. Yes Note that insurance prior authorization may be required for reimbursement for recommended care.  Comment: Rehab Admissions Coordinator to follow up.  Thanks,  Meredith Staggers, MD, Mellody Drown     10/28/2014

## 2014-10-28 NOTE — Progress Notes (Signed)
Patient being transferred to [facility] called Edward Duran with report they are coming take him down, patient is not complaining of pain and is alert. IV running at [facility] and SCD's on.

## 2014-10-28 NOTE — H&P (Signed)
Physical Medicine and Rehabilitation Admission H&P    Chief complaint: Back pain  HPI: DAILY CRATE is a 59 y.o. right handed male with history of hypertension, COPD, tobacco abuse. Patient lives with his 27 year old mother independent with a single-point cane sometimes requiring walker prior to admission. Presented 10/21/2014 with progressive low back pain 2 months and right lower extremity weakness as well as a 40 pound weight loss over the past few months. He denied bowel or bladder incontinence. X-ray/MRI and imaging revealed pathologic fracture of L3 with tumor invasion extending into the posterior elements and transverse process on the right. 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 In addition a nonenhancing lesion at L2 and T10 was seen suspicious for metastatic disease. An incidental right lower lobe lesion posteriorly measuring 2 x 2.5 cm with right hilar adenopathy noted. CT abdomen and pelvis remarkable for noted findings as well as complete collapse of the visualized right middle lobe likely related to central obstruction by the known lung mass. CT of the head negative for intracranial malignancy.. Underwent laminectomy inferior L2, L3 superior L4 for decompression and tumor resection, L3 corpectomy with anterior fusion L2-L4, internal fixation with pedicle screws at L1, L2, L4, L5. 10/23/2014 per Dr. Cyndy Freeze. Pap cytology report pending. Hospital course pain management. Acute blood loss anemia patient did require 7 units packed red blood cells and 1 unit fresh frozen plasma in the OR. Postoperative respiratory failure requiring full ventilatory support and slowly weaned from mechanical support. Oncology services Dr. Lindi Adie consulted and await pathology report and plan of care and patient to follow-up with [facility]. Urinary retention with Flomax added. Physical therapy evaluation completed  ongoing with recommendations of physical medicine rehabilitation consult. Patient was admitted for a comprehensive rehabilitation program  ROS Review of Systems  Constitutional: Negative for fever and chills.  HENT: Negative for hearing loss.  Eyes: Negative for blurred vision and double vision.  Respiratory: Positive for cough.   Shortness of breath with heavy exertion  Cardiovascular: Negative for chest pain, palpitations and leg swelling.  Gastrointestinal: Positive for nausea, vomiting and constipation. Negative for abdominal pain.  Genitourinary: Positive for urgency. Negative for hematuria.  Musculoskeletal: Positive for myalgias and back pain.   Right lower extremity weakness  Skin: Negative for rash.  Neurological: Positive for weakness. Negative for dizziness, tingling, seizures and headaches   Past Medical History  Diagnosis Date  . COPD (chronic obstructive pulmonary disease)   . Hypertension   . Stroke   . Cancer   . Carotid artery disease    History reviewed. No pertinent past surgical history. History reviewed. No pertinent family history. Social History:  reports that he has been smoking.  He does not have any smokeless tobacco history on file. He reports that he uses illicit drugs. He reports that he does not drink alcohol. Allergies: No Known Allergies Medications Prior to Admission  Medication Sig Dispense Refill  . budesonide-formoterol (SYMBICORT) 160-4.5 MCG/ACT inhaler Inhale 2 puffs into the lungs 2 (two) times daily.    Marland Kitchen ezetimibe-simvastatin (VYTORIN) 10-20 MG per tablet Take 1 tablet by mouth daily.    Marland Kitchen olmesartan-hydrochlorothiazide (BENICAR HCT) 20-12.5 MG per tablet Take 1 tablet by mouth daily.      Home: Home Living Family/patient expects to be discharged to:: Private residence Living Arrangements: Parent (47 y/o Mom in relatively good health) Available Help at Discharge: Family Type of Home: [place]  Access: Stairs to  enter [employer] of Steps: 6 Entrance Stairs-Rails: Right, Left Home Layout: One level Bathroom Shower/Tub: Chiropodist: Standard Home Equipment: Cane - single point, Environmental consultant - 2 wheels   Functional History: Prior Function Level of Independence: Independent with assistive device(s)  Functional Status:  Mobility: Bed Mobility Overal bed mobility: Needs Assistance Bed Mobility: Sidelying to Sit, Rolling Rolling: Min assist Sidelying to sit: Min assist General bed mobility comments: cuing for technique and practice Transfers Overall transfer level: Needs assistance Equipment used: Rolling walker (2 wheeled) Transfers: Sit to/from Stand Sit to Stand: Mod assist, +2 safety/equipment General transfer comment: cues for hand placement; poor carry over from previous Ambulation/Gait Ambulation/Gait assistance: Min assist, +2 physical assistance (moderate assist when LE buckles) Ambulation Distance (Feet): 80 Feet Assistive device: Rolling walker (2 wheeled) Gait Pattern/deviations: Step-through pattern, Decreased stride length, Drifts right/left General Gait Details: unsteady gait, decreased activity tolerance with increased WOB and DOE. Patient also with multiple occurances of RLE buckling during ambulation requiring increased moderate assist to maintain stability and prevent fall. Patient with history of multiple falls  Gait velocity: slow    ADL: ADL Overall ADL's : Needs assistance/impaired Grooming: Set up, Supervision/safety, Standing Upper Body Bathing: Minimal assitance, Sitting Lower Body Bathing: Moderate assistance, Sit to/from stand Upper Body Dressing : Minimal assistance, Sitting Upper Body Dressing Details (indicate cue type and reason): mod A for donning/doffing brace Lower Body Dressing: Moderate assistance, Sit to/from stand Toilet Transfer: Moderate assistance, Ambulation (simulated) Toileting- Clothing Manipulation and Hygiene:  Total assistance Toileting - Clothing Manipulation Details (indicate cue type and reason): incontinent Functional mobility during ADLs: +2 for physical assistance, +2 for safety/equipment, Moderate assistance General ADL Comments: +2 for safety and managemtn of equipmetn. Mod A to power up from sitting  Cognition: Cognition Overall Cognitive Status: Within Functional Limits for tasks assessed Orientation Level: Oriented X4 Cognition Arousal/Alertness: Awake/alert Behavior During Therapy: WFL for tasks assessed/performed Overall Cognitive Status: Within Functional Limits for tasks assessed  Physical Exam: Blood pressure 146/92, pulse 106, temperature 98.4 F (36.9 C), temperature source Oral, resp. rate 17, height 5' 6"  (1.676 m), weight 57.6 kg (126 lb 15.8 oz), SpO2 98 %. Physical Exam Constitutional: He is oriented to person, place, and time.  59 year old male appearing much older than stated age  HENT: dentition poor Head: Normocephalic.  Eyes: EOM are normal.  Neck: Normal range of motion. Neck supple. No thyromegaly present.  Cardiovascular: Normal rate and regular rhythm.  Respiratory: Effort normal and breath sounds normal. No respiratory distress.  GI: Soft. Bowel sounds are normal. He exhibits no distension.  Neurological: He is alert and oriented to person, place, and time.  UE 4/5. LE: 3+hf. 4-ke and ad/apf. No sensory findings. Limited by back pain. Cognitively was intact.  Skin:  Back incision is dressed    Results for orders placed or performed during the hospital encounter of 10/21/14 (from the past 48 hour(s))  CBC     Status: Abnormal   Collection Time: 10/27/14  6:50 AM  Result Value Ref Range   WBC 7.5 4.0 - 10.5 K/uL   RBC 3.07 (L) 4.22 - 5.81 MIL/uL   Hemoglobin 8.8 (L) 13.0 - 17.0 g/dL   HCT 26.3 (L) 39.0 - 52.0 %   MCV 85.7 78.0 - 100.0 fL   MCH 28.7 26.0 - 34.0 pg   MCHC 33.5 30.0 - 36.0 g/dL   RDW 15.8 (H) 11.5 - 15.5 %   Platelets 134 (L) 150 -  400 K/uL  Basic metabolic panel     Status: Abnormal   Collection Time: 10/27/14  6:50 AM  Result Value Ref Range   Sodium 135 135 - 145 mmol/L   Potassium 3.5 3.5 - 5.1 mmol/L   Chloride 104 101 - 111 mmol/L   CO2 24 22 - 32 mmol/L   Glucose, Bld 117 (H) 65 - 99 mg/dL   BUN 10 6 - 20 mg/dL   Creatinine, Ser 1.12 0.61 - 1.24 mg/dL   Calcium 7.8 (L) 8.9 - 10.3 mg/dL   GFR calc non Af Amer >60 >60 mL/min   GFR calc Af Amer >60 >60 mL/min    Comment: (NOTE) The eGFR has been calculated using the CKD EPI equation. This calculation has not been validated in all clinical situations. eGFR's persistently <60 mL/min signify possible Chronic Kidney Disease.    Anion gap 7 5 - 15  Magnesium     Status: None   Collection Time: 10/27/14  6:50 AM  Result Value Ref Range   Magnesium 2.1 1.7 - 2.4 mg/dL  Glucose, capillary     Status: Abnormal   Collection Time: 10/27/14  7:53 AM  Result Value Ref Range   Glucose-Capillary 113 (H) 65 - 99 mg/dL  Glucose, capillary     Status: Abnormal   Collection Time: 10/28/14  8:04 AM  Result Value Ref Range   Glucose-Capillary 132 (H) 65 - 99 mg/dL   Comment 1 Notify RN    Comment 2 Document in Chart    Ct Lumbar Spine Wo Contrast  10/28/2014   CLINICAL DATA:  New onset lumbar spine instability. Recent lumbar surgery for pathologic L3 compression fracture. Newly discovered lung mass.  EXAM: CT LUMBAR SPINE WITHOUT CONTRAST  TECHNIQUE: Multidetector CT imaging of the lumbar spine was performed without intravenous contrast administration. Multiplanar CT image reconstructions were also generated.  COMPARISON:  Lumbar spine MRI 10/21/2014. CT abdomen and pelvis 10/21/2014.  FINDINGS: There is mild lumbar levoscoliosis, less pronounced than on the preoperative CT. Alignment on sagittal images does not appear significantly changed.  Sequelae of interval L3 corpectomy are identified with cage placement for treatment of the previously described pathologic L3  compression fracture. The inferior aspect of the cage extends approximately 6 mm posterior to the margin of the L4 vertebral body. Destructive changes are again seen extending into the left aspect of the residual L3 vertebral body related to previously described tumor. Evaluation for residual epidural and paravertebral tumor is limited on this noncontrast CT. There has been wide posterior decompression at L3, with laminectomies at L2 as well. Two small caliber surgical drains are in place. Posterior fusion has been performed from L1-L5 with bilateral pedicle screws at L1, L2, L4, and L5. Screws and connecting rods appear intact. There is a new mildly displaced fracture of the right L4 transverse process.  There is no evidence of new lumbar compression fracture. Minimal anterior wedging of the T11 vertebral body is unchanged. 1 cm lesion seen in the T10 vertebral body on MRI is not clearly identified by CT. Evaluation of the spinal canal is limited on this noncontrast CT, particularly at the postoperative levels given associated streak artifact. Lumbar degenerative changes, including moderate spinal stenosis at L4-5, are better evaluated on recent MRI.  Right larger than left pleural effusions are partially visualized at the lung bases, much larger on the right than on the prior CT and new on the left. There is rather extensive atherosclerotic calcification of the abdominal aorta  and its major branch vessels.  IMPRESSION: 1. Interval L3 corpectomy, posterior decompression, and L1-L5 posterior fusion for treatment of pathologic L3 compression fracture as above. Limited evaluation of the spinal canal and for residual tumor on this unenhanced CT. 2. Mildly displaced right L4 transverse process fracture. 3. New/increased pleural effusions, right larger than left.   Electronically Signed   By: Logan Bores M.D.   On: 10/28/2014 08:13       Medical Problem List and Plan: 1. Functional deficits secondary to  pathologic L3 fracture due to lung mass. Follow-[facility] on discharge 2.  DVT Prophylaxis/Anticoagulation: SCDs. Check vascular study on admit 3. Pain Management: Oxycodone as needed. Monitor with increased mobility 4. Acute blood loss anemia. Follow-up CBC 5. Neuropsych: This patient is capable of making decisions on his own behalf. 6. Skin/Wound Care: Routine skin checks 7. Fluids/Electrolytes/Nutrition: Routine I&O with follow-up chemistries 8. Urinary retention. Continue Flomax. Check PVRs 3 9. COPD/ Tobacco abuse. Counseling 10. Hypertension. No current antihypertensives medication. Patient on Benicar 20-12 0.5 mg daily prior to admission. Monitor with increased mobility 11. Decreased nutritional storage. Continue nutritional supplements.  Post Admission Physician Evaluation: 1. Functional deficits secondary  to pathological L3 fracture. 2. Patient is admitted to receive collaborative, interdisciplinary care between the physiatrist, rehab nursing staff, and therapy team. 3. Patient's level of medical complexity and substantial therapy needs in context of that medical necessity cannot be provided at a lesser intensity of care such as a SNF. 4. Patient has experienced substantial functional loss from his/her baseline which was documented above under the "Functional History" and "Functional Status" headings.  Judging by the patient's diagnosis, physical exam, and functional history, the patient has potential for functional progress which will result in measurable gains while on inpatient rehab.  These gains will be of substantial and practical use upon discharge  in facilitating mobility and self-care at the household level. 5. Physiatrist will provide 24 hour management of medical needs as well as oversight of the therapy plan/treatment and provide guidance as appropriate regarding the interaction of the two. 6. 24 hour rehab nursing will assist with  bladder management, bowel management, safety, skin/wound care, disease management, medication administration, pain management and patient education  and help integrate therapy concepts, techniques,education, etc. 7. PT will assess and treat for/with: Lower extremity strength, range of motion, stamina, balance, functional mobility, safety, adaptive techniques and equipment, pain mgt, back precautions, ego support, community reintegration.   Goals are: mod I. 8. OT will assess and treat for/with: ADL's, functional mobility, safety, upper extremity strength, adaptive techniques and equipment, NMR, ego support, community reintegration.   Goals are: modI. Therapy may not yet proceed with showering this patient. 9. SLP will assess and treat for/with: n/a.  Goals are: n/a. 10. Case Management and Social Worker will assess and treat for psychological issues and discharge planning. 11. Team conference will be held weekly to assess progress toward goals and to determine barriers to discharge. 12. Patient will receive at least 3 hours of therapy per day at least 5 days per week. 13. ELOS: 7-10 days       14. Prognosis:  excellent     Meredith Staggers, MD, [facility] 10/28/2014

## 2014-10-28 NOTE — H&P (View-Only) (Signed)
Physical Medicine and Rehabilitation Admission H&P    Chief complaint: Back pain  HPI: Edward Duran is a 59 y.o. right handed male with history of hypertension, COPD, tobacco abuse. Patient lives with his 20 year old mother independent with a single-point cane sometimes requiring walker prior to admission. Presented 10/21/2014 with progressive low back pain 2 months and right lower extremity weakness as well as a 40 pound weight loss over the past few months. He denied bowel or bladder incontinence. X-ray/MRI and imaging revealed pathologic fracture of L3 with tumor invasion extending into the posterior elements and transverse process on the right. 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 In addition a nonenhancing lesion at L2 and T10 was seen suspicious for metastatic disease. An incidental right lower lobe lesion posteriorly measuring 2 x 2.5 cm with right hilar adenopathy noted. CT abdomen and pelvis remarkable for noted findings as well as complete collapse of the visualized right middle lobe likely related to central obstruction by the known lung mass. CT of the head negative for intracranial malignancy.. Underwent laminectomy inferior L2, L3 superior L4 for decompression and tumor resection, L3 corpectomy with anterior fusion L2-L4, internal fixation with pedicle screws at L1, L2, L4, L5. 10/23/2014 per Dr. Cyndy Freeze. Pap cytology report pending. Hospital course pain management. Acute blood loss anemia patient did require 7 units packed red blood cells and 1 unit fresh frozen plasma in the OR. Postoperative respiratory failure requiring full ventilatory support and slowly weaned from mechanical support. Oncology services Dr. Lindi Adie consulted and await pathology report and plan of care and patient to follow-up with [facility]. Urinary retention with Flomax added. Physical therapy evaluation completed  ongoing with recommendations of physical medicine rehabilitation consult. Patient was admitted for a comprehensive rehabilitation program  ROS Review of Systems  Constitutional: Negative for fever and chills.  HENT: Negative for hearing loss.  Eyes: Negative for blurred vision and double vision.  Respiratory: Positive for cough.   Shortness of breath with heavy exertion  Cardiovascular: Negative for chest pain, palpitations and leg swelling.  Gastrointestinal: Positive for nausea, vomiting and constipation. Negative for abdominal pain.  Genitourinary: Positive for urgency. Negative for hematuria.  Musculoskeletal: Positive for myalgias and back pain.   Right lower extremity weakness  Skin: Negative for rash.  Neurological: Positive for weakness. Negative for dizziness, tingling, seizures and headaches   Past Medical History  Diagnosis Date  . COPD (chronic obstructive pulmonary disease)   . Hypertension   . Stroke   . Cancer   . Carotid artery disease    History reviewed. No pertinent past surgical history. History reviewed. No pertinent family history. Social History:  reports that he has been smoking.  He does not have any smokeless tobacco history on file. He reports that he uses illicit drugs. He reports that he does not drink alcohol. Allergies: No Known Allergies Medications Prior to Admission  Medication Sig Dispense Refill  . budesonide-formoterol (SYMBICORT) 160-4.5 MCG/ACT inhaler Inhale 2 puffs into the lungs 2 (two) times daily.    Marland Kitchen ezetimibe-simvastatin (VYTORIN) 10-20 MG per tablet Take 1 tablet by mouth daily.    Marland Kitchen olmesartan-hydrochlorothiazide (BENICAR HCT) 20-12.5 MG per tablet Take 1 tablet by mouth daily.      Home: Home Living Family/patient expects to be discharged to:: Private residence Living Arrangements: Parent (46 y/o Mom in relatively good health) Available Help at Discharge: Family Type of Home: [place]  Access: Stairs to  enter [employer] of Steps: 6 Entrance Stairs-Rails: Right, Left Home Layout: One level Bathroom Shower/Tub: Chiropodist: Standard Home Equipment: Cane - single point, Environmental consultant - 2 wheels   Functional History: Prior Function Level of Independence: Independent with assistive device(s)  Functional Status:  Mobility: Bed Mobility Overal bed mobility: Needs Assistance Bed Mobility: Sidelying to Sit, Rolling Rolling: Min assist Sidelying to sit: Min assist General bed mobility comments: cuing for technique and practice Transfers Overall transfer level: Needs assistance Equipment used: Rolling walker (2 wheeled) Transfers: Sit to/from Stand Sit to Stand: Mod assist, +2 safety/equipment General transfer comment: cues for hand placement; poor carry over from previous Ambulation/Gait Ambulation/Gait assistance: Min assist, +2 physical assistance (moderate assist when LE buckles) Ambulation Distance (Feet): 80 Feet Assistive device: Rolling walker (2 wheeled) Gait Pattern/deviations: Step-through pattern, Decreased stride length, Drifts right/left General Gait Details: unsteady gait, decreased activity tolerance with increased WOB and DOE. Patient also with multiple occurances of RLE buckling during ambulation requiring increased moderate assist to maintain stability and prevent fall. Patient with history of multiple falls  Gait velocity: slow    ADL: ADL Overall ADL's : Needs assistance/impaired Grooming: Set up, Supervision/safety, Standing Upper Body Bathing: Minimal assitance, Sitting Lower Body Bathing: Moderate assistance, Sit to/from stand Upper Body Dressing : Minimal assistance, Sitting Upper Body Dressing Details (indicate cue type and reason): mod A for donning/doffing brace Lower Body Dressing: Moderate assistance, Sit to/from stand Toilet Transfer: Moderate assistance, Ambulation (simulated) Toileting- Clothing Manipulation and Hygiene:  Total assistance Toileting - Clothing Manipulation Details (indicate cue type and reason): incontinent Functional mobility during ADLs: +2 for physical assistance, +2 for safety/equipment, Moderate assistance General ADL Comments: +2 for safety and managemtn of equipmetn. Mod A to power up from sitting  Cognition: Cognition Overall Cognitive Status: Within Functional Limits for tasks assessed Orientation Level: Oriented X4 Cognition Arousal/Alertness: Awake/alert Behavior During Therapy: WFL for tasks assessed/performed Overall Cognitive Status: Within Functional Limits for tasks assessed  Physical Exam: Blood pressure 146/92, pulse 106, temperature 98.4 F (36.9 C), temperature source Oral, resp. rate 17, height 5' 6"  (1.676 m), weight 57.6 kg (126 lb 15.8 oz), SpO2 98 %. Physical Exam Constitutional: He is oriented to person, place, and time.  59 year old male appearing much older than stated age  HENT: dentition poor Head: Normocephalic.  Eyes: EOM are normal.  Neck: Normal range of motion. Neck supple. No thyromegaly present.  Cardiovascular: Normal rate and regular rhythm.  Respiratory: Effort normal and breath sounds normal. No respiratory distress.  GI: Soft. Bowel sounds are normal. He exhibits no distension.  Neurological: He is alert and oriented to person, place, and time.  UE 4/5. LE: 3+hf. 4-ke and ad/apf. No sensory findings. Limited by back pain. Cognitively was intact.  Skin:  Back incision is dressed    Results for orders placed or performed during the hospital encounter of 10/21/14 (from the past 48 hour(s))  CBC     Status: Abnormal   Collection Time: 10/27/14  6:50 AM  Result Value Ref Range   WBC 7.5 4.0 - 10.5 K/uL   RBC 3.07 (L) 4.22 - 5.81 MIL/uL   Hemoglobin 8.8 (L) 13.0 - 17.0 g/dL   HCT 26.3 (L) 39.0 - 52.0 %   MCV 85.7 78.0 - 100.0 fL   MCH 28.7 26.0 - 34.0 pg   MCHC 33.5 30.0 - 36.0 g/dL   RDW 15.8 (H) 11.5 - 15.5 %   Platelets 134 (L) 150 -  400 K/uL  Basic metabolic panel     Status: Abnormal   Collection Time: 10/27/14  6:50 AM  Result Value Ref Range   Sodium 135 135 - 145 mmol/L   Potassium 3.5 3.5 - 5.1 mmol/L   Chloride 104 101 - 111 mmol/L   CO2 24 22 - 32 mmol/L   Glucose, Bld 117 (H) 65 - 99 mg/dL   BUN 10 6 - 20 mg/dL   Creatinine, Ser 1.12 0.61 - 1.24 mg/dL   Calcium 7.8 (L) 8.9 - 10.3 mg/dL   GFR calc non Af Amer >60 >60 mL/min   GFR calc Af Amer >60 >60 mL/min    Comment: (NOTE) The eGFR has been calculated using the CKD EPI equation. This calculation has not been validated in all clinical situations. eGFR's persistently <60 mL/min signify possible Chronic Kidney Disease.    Anion gap 7 5 - 15  Magnesium     Status: None   Collection Time: 10/27/14  6:50 AM  Result Value Ref Range   Magnesium 2.1 1.7 - 2.4 mg/dL  Glucose, capillary     Status: Abnormal   Collection Time: 10/27/14  7:53 AM  Result Value Ref Range   Glucose-Capillary 113 (H) 65 - 99 mg/dL  Glucose, capillary     Status: Abnormal   Collection Time: 10/28/14  8:04 AM  Result Value Ref Range   Glucose-Capillary 132 (H) 65 - 99 mg/dL   Comment 1 Notify RN    Comment 2 Document in Chart    Ct Lumbar Spine Wo Contrast  10/28/2014   CLINICAL DATA:  New onset lumbar spine instability. Recent lumbar surgery for pathologic L3 compression fracture. Newly discovered lung mass.  EXAM: CT LUMBAR SPINE WITHOUT CONTRAST  TECHNIQUE: Multidetector CT imaging of the lumbar spine was performed without intravenous contrast administration. Multiplanar CT image reconstructions were also generated.  COMPARISON:  Lumbar spine MRI 10/21/2014. CT abdomen and pelvis 10/21/2014.  FINDINGS: There is mild lumbar levoscoliosis, less pronounced than on the preoperative CT. Alignment on sagittal images does not appear significantly changed.  Sequelae of interval L3 corpectomy are identified with cage placement for treatment of the previously described pathologic L3  compression fracture. The inferior aspect of the cage extends approximately 6 mm posterior to the margin of the L4 vertebral body. Destructive changes are again seen extending into the left aspect of the residual L3 vertebral body related to previously described tumor. Evaluation for residual epidural and paravertebral tumor is limited on this noncontrast CT. There has been wide posterior decompression at L3, with laminectomies at L2 as well. Two small caliber surgical drains are in place. Posterior fusion has been performed from L1-L5 with bilateral pedicle screws at L1, L2, L4, and L5. Screws and connecting rods appear intact. There is a new mildly displaced fracture of the right L4 transverse process.  There is no evidence of new lumbar compression fracture. Minimal anterior wedging of the T11 vertebral body is unchanged. 1 cm lesion seen in the T10 vertebral body on MRI is not clearly identified by CT. Evaluation of the spinal canal is limited on this noncontrast CT, particularly at the postoperative levels given associated streak artifact. Lumbar degenerative changes, including moderate spinal stenosis at L4-5, are better evaluated on recent MRI.  Right larger than left pleural effusions are partially visualized at the lung bases, much larger on the right than on the prior CT and new on the left. There is rather extensive atherosclerotic calcification of the abdominal aorta  and its major branch vessels.  IMPRESSION: 1. Interval L3 corpectomy, posterior decompression, and L1-L5 posterior fusion for treatment of pathologic L3 compression fracture as above. Limited evaluation of the spinal canal and for residual tumor on this unenhanced CT. 2. Mildly displaced right L4 transverse process fracture. 3. New/increased pleural effusions, right larger than left.   Electronically Signed   By: Logan Bores M.D.   On: 10/28/2014 08:13       Medical Problem List and Plan: 1. Functional deficits secondary to  pathologic L3 fracture due to lung mass. Follow-[facility] on [facility] 2.  DVT Prophylaxis/Anticoagulation: SCDs. Check vascular study on admit 3. Pain Management: Oxycodone as needed. Monitor with increased mobility 4. Acute blood loss anemia. Follow-up CBC 5. Neuropsych: This patient is capable of making decisions on his own behalf. 6. Skin/Wound Care: Routine skin checks 7. Fluids/Electrolytes/Nutrition: Routine I&O with follow-up chemistries 8. Urinary retention. Continue Flomax. Check PVRs 3 9. COPD/ Tobacco abuse. Counseling 10. Hypertension. No current antihypertensives medication. Patient on Benicar 20-12 0.5 mg daily prior to admission. Monitor with increased mobility 11. Decreased nutritional storage. Continue nutritional supplements.  Post Admission Physician Evaluation: 1. Functional deficits secondary  to pathological L3 fracture. 2. Patient is admitted to receive collaborative, interdisciplinary care between the physiatrist, rehab nursing staff, and therapy team. 3. Patient's level of medical complexity and substantial therapy needs in context of that medical necessity cannot be provided at a lesser intensity of care such as a SNF. 4. Patient has experienced substantial functional loss from his/her baseline which was documented above under the "Functional History" and "Functional Status" headings.  Judging by the patient's diagnosis, physical exam, and functional history, the patient has potential for functional progress which will result in measurable gains while on inpatient rehab.  These gains will be of substantial and practical use upon discharge  in facilitating mobility and self-care at the household level. 5. Physiatrist will provide 24 hour management of medical needs as well as oversight of the therapy plan/treatment and provide guidance as appropriate regarding the interaction of the two. 6. 24 hour rehab nursing will assist with  bladder management, bowel management, safety, skin/wound care, disease management, medication administration, pain management and patient education  and help integrate therapy concepts, techniques,education, etc. 7. PT will assess and treat for/with: Lower extremity strength, range of motion, stamina, balance, functional mobility, safety, adaptive techniques and equipment, pain mgt, back precautions, ego support, community reintegration.   Goals are: mod I. 8. OT will assess and treat for/with: ADL's, functional mobility, safety, upper extremity strength, adaptive techniques and equipment, NMR, ego support, community reintegration.   Goals are: modI. Therapy may not yet proceed with showering this patient. 9. SLP will assess and treat for/with: n/a.  Goals are: n/a. 10. Case Management and Social Worker will assess and treat for psychological issues and discharge planning. 11. Team conference will be held weekly to assess progress toward goals and to determine barriers to discharge. 12. Patient will receive at least 3 hours of therapy per day at least 5 days per week. 13. ELOS: 7-10 days       14. Prognosis:  excellent     Meredith Staggers, MD, [place] [facility] 10/28/2014

## 2014-10-28 NOTE — Progress Notes (Signed)
Inpatient Rehabilitation  I met with the patient at the bedside and discussed with him the recommendation/option for  CIR.  I informed him about about our program and and provided him with educational booklets.I have answered his questions and have discussed with Dr. Sloan Leiter. Hehas given medical clearance.  I have updated pt's RN Remo Lipps of the plan as well as Lorne Skeens, CM and Social Workers [employer].  I will arrange for admission to occur later today.  Please call if questions.  [place] Admissions Coordinator Cell [phone] Office [phone]

## 2014-10-29 ENCOUNTER — Inpatient Hospital Stay (HOSPITAL_COMMUNITY): Payer: Medicaid Other | Admitting: Physical Therapy

## 2014-10-29 ENCOUNTER — Inpatient Hospital Stay (HOSPITAL_COMMUNITY): Payer: Self-pay | Admitting: Occupational Therapy

## 2014-10-29 ENCOUNTER — Inpatient Hospital Stay (HOSPITAL_COMMUNITY): Payer: Medicaid Other

## 2014-10-29 DIAGNOSIS — M7989 Other specified soft tissue disorders: Secondary | ICD-10-CM

## 2014-10-29 LAB — COMPREHENSIVE METABOLIC PANEL
ALBUMIN: 2.2 g/dL — AB (ref 3.5–5.0)
ALT: 24 U/L (ref 17–63)
AST: 23 U/L (ref 15–41)
Alkaline Phosphatase: 70 U/L (ref 38–126)
Anion gap: 7 (ref 5–15)
BUN: 13 mg/dL (ref 6–20)
CHLORIDE: 102 mmol/L (ref 101–111)
CO2: 24 mmol/L (ref 22–32)
CREATININE: 1.09 mg/dL (ref 0.61–1.24)
Calcium: 8 mg/dL — ABNORMAL LOW (ref 8.9–10.3)
GFR calc Af Amer: >60 mL/min (ref >60)
GLUCOSE: 120 mg/dL — AB (ref 65–99)
Potassium: 3.4 mmol/L — ABNORMAL LOW (ref 3.5–5.1)
Sodium: 133 mmol/L — ABNORMAL LOW (ref 135–145)
Total Bilirubin: 0.7 mg/dL (ref 0.3–1.2)
Total Protein: 4.8 g/dL — ABNORMAL LOW (ref 6.5–8.1)

## 2014-10-29 LAB — CBC WITH DIFFERENTIAL/PLATELET
BASOS ABS: 0 10*3/uL (ref 0.0–0.1)
Basophils Relative: 0 % (ref 0–1)
EOS PCT: 4 % (ref 0–5)
Eosinophils Absolute: 0.5 10*3/uL (ref 0.0–0.7)
HEMATOCRIT: 29 % — AB (ref 39.0–52.0)
Hemoglobin: 9.6 g/dL — ABNORMAL LOW (ref 13.0–17.0)
LYMPHS PCT: 6 % — AB (ref 12–46)
Lymphs Abs: 0.7 10*3/uL (ref 0.7–4.0)
MCH: 28.5 pg (ref 26.0–34.0)
MCHC: 33.1 g/dL (ref 30.0–36.0)
MCV: 86.1 fL (ref 78.0–100.0)
Monocytes Absolute: 1.2 10*3/uL — ABNORMAL HIGH (ref 0.1–1.0)
Monocytes Relative: 10 % (ref 3–12)
NEUTROS ABS: 9 10*3/uL — AB (ref 1.7–7.7)
Neutrophils Relative %: 80 % — ABNORMAL HIGH (ref 43–77)
PLATELETS: 209 10*3/uL (ref 150–400)
RBC: 3.37 MIL/uL — AB (ref 4.22–5.81)
RDW: 15.5 % (ref 11.5–15.5)
WBC: 11.3 10*3/uL — AB (ref 4.0–10.5)

## 2014-10-29 MED ORDER — ALBUTEROL SULFATE (2.5 MG/3ML) 0.083% IN NEBU: 2.5000 mg | INHALATION_SOLUTION | Freq: Four times a day (QID) | RESPIRATORY_TRACT | Status: DC

## 2014-10-29 MED ORDER — POTASSIUM CHLORIDE CRYS ER 20 MEQ PO TBCR
20.0000 meq | EXTENDED_RELEASE_TABLET | Freq: Every day | ORAL | Status: AC
  Administered 2014-10-29 – 2014-10-31 (×3): 20 meq via ORAL
  Filled 2014-10-29 (×3): qty 1

## 2014-10-29 MED ORDER — IPRATROPIUM-ALBUTEROL 0.5-2.5 (3) MG/3ML IN SOLN
3.0000 mL | Freq: Four times a day (QID) | RESPIRATORY_TRACT | Status: DC
  Administered 2014-10-29 – 2014-11-03 (×18): 3 mL via RESPIRATORY_TRACT
  Filled 2014-10-29 (×21): qty 3

## 2014-10-29 NOTE — Progress Notes (Signed)
No acute events AVSS Motor 5/5 BLE Incision c/d/i Stable Continue therapy

## 2014-10-29 NOTE — Progress Notes (Signed)
Patient information reviewed and entered into [facility] by Becky Jayquon Theiler, covering PPS coordinator. Information including medical coding and functional independence measure will be reviewed and updated through discharge.   

## 2014-10-29 NOTE — Evaluation (Signed)
Occupational Therapy Assessment and Plan  Patient Details  Name: Edward Duran MRN: [id number] Date of Birth: [birth year]  OT Diagnosis: abnormal posture, acute pain and muscle weakness (generalized) Rehab Potential:  good  ELOS: 7-10 days   Today's Date: 10/29/2014 OT Individual Time: 4709-6283 OT Individual Time Calculation (min): 60 min     Problem List:  Patient Active Problem List   Diagnosis Date Noted  . Lung mass 10/28/2014  . Debility 10/28/2014  . Metastatic cancer to spine 10/23/2014  . Shock circulatory 10/23/2014  . Respiratory failure, post-operative 10/23/2014  . Hypomagnesemia 10/23/2014  . HTN (hypertension) 10/21/2014  . 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 Hilar mass 10/21/2014  . COPD (chronic obstructive pulmonary disease) 10/21/2014  . Leg weakness 10/21/2014  . AKI (acute kidney injury) 10/21/2014  . Hyperglycemia 10/21/2014  . Pathological fracture 10/21/2014  . Carotid artery disease     Past Medical History:  Past Medical History  Diagnosis Date  . COPD (chronic obstructive pulmonary disease)   . Hypertension   . Stroke   . Cancer   . Carotid artery disease    Past Surgical History: No past surgical history on file.  Assessment & Plan Clinical Impression: Patient is a 59 y.o. year old male with history of hypertension, COPD, tobacco abuse. Patient lives with his 87 year old mother independent with a single-point cane sometimes requiring walker prior to admission. Presented 10/21/2014 with progressive low back pain 2 months and right lower extremity weakness as well as a 40 pound weight loss over the past few months. He denied bowel or bladder incontinence. X-ray/MRI and imaging revealed pathologic fracture of L3 with tumor invasion extending into the posterior elements and transverse process on the right. 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 In  addition a nonenhancing lesion at L2 and T10 was seen suspicious for metastatic disease. An incidental right lower lobe lesion posteriorly measuring 2 x 2.5 cm with right hilar adenopathy noted. CT abdomen and pelvis remarkable for noted findings as well as complete collapse of the visualized right middle lobe likely related to central obstruction by the known lung mass. CT of the head negative for intracranial malignancy.. Underwent laminectomy inferior L2, L3 superior L4 for decompression and tumor resection, L3 corpectomy with anterior fusion L2-L4, internal fixation with pedicle screws at L1, L2, L4, L5. 10/23/2014 per Dr. Cyndy Freeze. Pap cytology report pending. Hospital course pain management. Acute blood loss anemia patient did require 7 units packed red blood cells and 1 unit fresh frozen plasma in the OR. Postoperative respiratory failure requiring full ventilatory support and slowly weaned from mechanical support. Oncology services Dr. Lindi Adie consulted and await pathology report and plan of care and patient to follow-[facility]. Urinary retention with Flomax added.  Patient transferred to CIR on 10/28/2014 .    Patient currently requires mod to total A for bathing and dressing (especially LB) with basic self-care skills and min to mod A for basic mobility secondary to muscle weakness and acute pain, decreased cardiorespiratoy endurance, decreased attention, decreased awareness, decreased problem solving, decreased safety awareness and decreased memory and decreased sitting balance, decreased standing balance, decreased postural control, decreased balance strategies and difficulty maintaining precautions.  Prior to hospitalization, patient could complete ADL with unsafe mod I .  Patient will benefit from skilled intervention to decrease level of assist with basic self-care skills and increase independence with  basic self-care skills prior to discharge home  with care partner.  Anticipate patient will require 24 hour supervision and follow up home health.  OT - End of Session Activity Tolerance: Tolerates < 10 min activity with changes in vital signs Endurance Deficit: Yes Endurance Deficit Description: labored breathing throughout session OT Assessment OT Patient demonstrates impairments in the following area(s): Balance;Behavior;Edema;Endurance;Cognition;Motor;Pain;Perception OT Basic ADL's Functional Problem(s): Grooming;Bathing;Dressing;Toileting OT Transfers Functional Problem(s): Toilet;Tub/Shower OT Additional Impairment(s): None OT Plan OT Intensity: Minimum of 1-2 x/day, 45 to 90 minutes OT Frequency: 5 out of 7 days OT Duration/Estimated Length of Stay: 7-10 days OT Treatment/Interventions: Cognitive remediation/compensation;Balance/vestibular training;Community reintegration;Discharge planning;Functional mobility training;Neuromuscular re-education;Patient/family education;Self Care/advanced ADL retraining;Therapeutic Exercise;UE/LE Coordination activities;UE/LE Strength taining/ROM;Therapeutic Activities;Psychosocial support;Pain management OT Self Feeding Anticipated Outcome(s): n/a OT Basic Self-Care Anticipated Outcome(s): supervision OT Toileting Anticipated Outcome(s): supervision OT Bathroom Transfers Anticipated Outcome(s): supervision OT Recommendation Patient destination: Home Follow Up Recommendations: Home health OT Equipment Recommended: 3 in 1 bedside comode   Skilled Therapeutic Intervention 1:1 OT eval initiated with OT purpose, role and goals discussed. Self care retraining at sink level with focus on transfer training, standing tolerance and balance, activity tolerance with frequent rest breaks, functional problem solving, d/c planning and education on back precautions and how to incorporate them in treatment.   OT Evaluation Precautions/Restrictions  Precautions Precautions: Back;Fall Required Braces or  Orthoses: Spinal Brace Restrictions Weight Bearing Restrictions: No General Chart Reviewed: Yes Family/Caregiver Present: No Vital Signs Therapy Vitals Temp: 97.9 F (36.6 C) Temp Source: Oral Pulse Rate: 97 Resp: 18 BP: 108/79 mmHg Patient Position (if appropriate): Lying Oxygen Therapy SpO2: 99 % O2 Device: Not Delivered Pain  no c/o pain  Home Living/Prior Functioning Home Living Available Help at Discharge: Family Type of Home: House Home Access: Stairs to enter Technical brewer of Steps: 6 Entrance Stairs-Rails: Right, Left Home Layout: One level Bathroom Shower/Tub: Chiropodist: Standard  Lives With: Family (mother) ADL ADL ADL Comments: see Functional status Vision/Perception  Vision- History Patient Visual Report: No change from baseline  Cognition Overall Cognitive Status: Within Functional Limits for tasks assessed Arousal/Alertness: Awake/alert Orientation Level: Person;Place;Situation Person: Oriented Place: Oriented Situation: Oriented Year: 2016 Month: September Day of Week: Correct Memory: Impaired Memory Impairment: Decreased recall of new information;Decreased short term memory Decreased Short Term Memory: Functional basic Immediate Memory Recall: Sock;Blue;Bed Memory Recall:  (didnt recall any of these- even with cuing) Attention: Alternating Selective Attention: Appears intact Awareness: Appears intact Problem Solving: Impaired Problem Solving Impairment: Functional complex Sensation Sensation Light Touch: Appears Intact Stereognosis: Appears Intact Proprioception: Appears Intact Coordination Gross Motor Movements are Fluid and Coordinated: No Fine Motor Movements are Fluid and Coordinated: Yes Motor  Motor Motor - Skilled Clinical Observations: acute pain and generalized weakness Mobility  Transfers Transfers: Sit to Stand;Stand to Sit Sit to Stand: 3: Mod assist Stand to Sit: 4: Min assist   Trunk/Postural Assessment  Cervical Assessment Cervical Assessment: Within Functional Limits Lumbar Assessment Lumbar Assessment:  (restricted by precautions and brace) Postural Control Postural Control: Deficits on evaluation Trunk Control: requiring A in standing Righting Reactions: delayed  Balance Balance Balance Assessed: Yes Dynamic Sitting Balance Dynamic Sitting - Level of Assistance: 5: Stand by assistance;4: Min assist Static Standing Balance Static Standing - Level of Assistance: 4: Min assist;3: Mod assist Extremity/Trunk Assessment RUE Assessment RUE Assessment: Within Functional Limits (reports old CVA changes in UE- not demonstrated in function) LUE Assessment LUE Assessment: Within Functional Limits   See Function Navigator for Current  Functional Status.   Refer to Care Plan for Long Term Goals  Recommendations for other services: Neuropsych  Discharge Criteria: Patient will be discharged from OT if patient refuses treatment 3 consecutive times without medical reason, if treatment goals not met, if there is a change in medical status, if patient makes no progress towards goals or if patient is discharged from hospital.  The above assessment, treatment plan, treatment alternatives and goals were discussed and mutually agreed upon: by patient  Nicoletta Ba 10/29/2014, 8:57 AM

## 2014-10-29 NOTE — Progress Notes (Signed)
Edward Duran Rehab Admission Coordinator Signed Physical Medicine and Rehabilitation PMR Pre-admission 10/28/2014 12:53 PM  Related encounter: ED to Hosp-Admission (Discharged) from 10/21/2014 in [place] Collapse All   PMR Admission Coordinator Pre-Admission Assessment  Patient: Edward Duran is an 59 y.o., male MRN: [id number] DOB: 08-Oct-1955 Height: '5\' 6"'$  (167.6 cm) Weight: 57.6 kg (126 lb 15.8 oz)  Insurance Information HMO: PPO: PCP: IPA: 80/20: OTHER:  PRIMARY: Self pay Policy#: Subscriber:  CM Name: Phone#: Fax#:  Pre-Cert#: Employer:  Benefits: Phone #: Name:  Eff. Date: Deduct: Out of Pocket Max: Life Max:  CIR: SNF:  Outpatient: Co-Pay:  Home Health: Co-Pay:  DME: Co-Pay:  Providers:    Medicaid Application Date: Case Manager:  Disability Application Date: Case Worker:   Emergency Contact Information Contact Information    Name Relation Home Work Mobile   Sharp,Helen Mother [phone]       Current Medical History  Patient Admitting Diagnosis: pathological L3 fracture due to lung mass History of Present Illness: Edward Duran is a 59 y.o. right handed male with history of hypertension, COPD, tobacco abuse. Patient lives with his 76 year old mother independent with a single-point cane sometimes requiring walker prior to admission. Presented 10/21/2014 with progressive low back pain 2 months and right lower extremity weakness as well as a 40 pound weight loss over the past few months. He denied bowel or bladder incontinence. X-ray/MRI and imaging revealed pathologic fracture of L3 with tumor invasion extending into the posterior elements and  transverse process on the right. 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 In addition a nonenhancing lesion at L2 and T10 was seen suspicious for metastatic disease. An incidental right lower lobe lesion posteriorly measuring 2 x 2.5 cm with right hilar adenopathy noted. CT abdomen and pelvis remarkable for noted findings as well as complete collapse of the visualized right middle lobe likely related to central obstruction by the known lung mass. CT of the head negative for intracranial malignancy.. Underwent laminectomy inferior L2, L3 superior L4 for decompression and tumor resection, L3 corpectomy with anterior fusion L2-L4, internal fixation with pedicle screws at L1, L2, L4, L5. 10/23/2014 per Dr. Cyndy Freeze. Pap cytology report pending. Hospital course pain management. Acute blood loss anemia patient did require 7 units packed red blood cells and 1 unit fresh frozen plasma in the OR. Postoperative respiratory failure requiring full ventilatory support and slowly weaned from mechanical support. Oncology services Dr. Lindi Adie consulted and await pathology report and plan of care and patient to follow-[facility]. Urinary retention with Flomax added. Physical therapy evaluation completed ongoing with recommendations of physical medicine rehabilitation consult. Patient was admitted for a comprehensive rehabilitation program      Past Medical History  Past Medical History  Diagnosis Date  . COPD (chronic obstructive pulmonary disease)   . Hypertension   . Stroke   . Cancer   . Carotid artery disease     Family History  family history is not on file.  Prior Rehab/Hospitalizations:  Has the patient had major surgery during 100 days prior to admission? No; pt. Denies prior rehab. Says he was hospitalized about 6 months ago, but cannot state clearly why he was  hospitalized.  Current Medications   Current facility-administered medications:  . 0.9 % sodium chloride infusion, , Intravenous, Continuous, Juanito Doom, MD, Stopped at 10/25/14 [phone] .  0.9 % sodium chloride infusion, 10 mL/hr, Intravenous, Once, Kevan Ny Ditty, MD . [DISCONTINUED] acetaminophen (TYLENOL) tablet 650 mg, 650 mg, Oral, Q6H PRN, 650 mg at 10/22/14 2112 **OR** acetaminophen (TYLENOL) suppository 650 mg, 650 mg, Rectal, Q6H PRN, Allyne Gee, MD . albuterol (PROVENTIL) (2.5 MG/3ML) 0.083% nebulizer solution 2.5 mg, 2.5 mg, Nebulization, Q3H PRN, Corey Harold, NP . fentaNYL (SUBLIMAZE) injection 12.5-25 mcg, 12.5-25 mcg, Intravenous, Q2H PRN, Juanito Doom, MD, 25 mcg at 11/91/47 8295 . folic acid (FOLVITE) tablet 1 mg, 1 mg, Oral, Daily, Allyne Gee, MD, 1 mg at 10/28/14 1025 . hydrALAZINE (APRESOLINE) injection 10-40 mg, 10-40 mg, Intravenous, Q4H PRN, Corey Harold, NP . iohexol (OMNIPAQUE) 300 MG/ML solution 25 mL, 25 mL, Oral, Once PRN, Medication Radiologist, MD . ipratropium-albuterol (DUONEB) 0.5-2.5 (3) MG/3ML nebulizer solution 3 mL, 3 mL, Nebulization, Q6H, Albertine Patricia, MD, 3 mL at 10/28/14 0812 . lactose free nutrition (BOOST PLUS) liquid 237 mL, 237 mL, Oral, TID BM, Ardeen Garland, RD, 237 mL at 10/27/14 1000 . multivitamin with minerals tablet 1 tablet, 1 tablet, Oral, Daily, Allyne Gee, MD, 1 tablet at 10/28/14 1025 . oxyCODONE (Oxy IR/ROXICODONE) immediate release tablet 5-10 mg, 5-10 mg, Oral, Q4H PRN, Jonetta Osgood, MD, 5 mg at 10/28/14 0109 . pantoprazole (PROTONIX) EC tablet 40 mg, 40 mg, Oral, q1800, Juanito Doom, MD, 40 mg at 10/27/14 1618 . polyethylene glycol (MIRALAX / GLYCOLAX) packet 17 g, 17 g, Oral, BID, Jonetta Osgood, MD, 17 g at 10/28/14 1025 . senna-docusate (Senokot-S) tablet 2 tablet, 2 tablet, Oral, QHS, Jonetta Osgood, MD, 2 tablet at 10/27/14 2121 . sodium chloride 0.9 %  injection 3 mL, 3 mL, Intravenous, Q12H, Allyne Gee, MD, 3 mL at 10/28/14 1025 . sodium phosphate (FLEET) 7-19 GM/118ML enema 1 enema, 1 enema, Rectal, Daily PRN, Jonetta Osgood, MD . tamsulosin [facility]) capsule 0.4 mg, 0.4 mg, Oral, Daily, Jonetta Osgood, MD, 0.4 mg at 10/28/14 1025 . thiamine (VITAMIN B-1) tablet 100 mg, 100 mg, Oral, Daily, Allyne Gee, MD, 100 mg at 10/28/14 1025  Patients Current Diet: Diet regular Room service appropriate?: Yes; Fluid consistency:: Thin  Precautions / Restrictions Precautions Precautions: Back, Fall Restrictions Weight Bearing Restrictions: No   Has the patient had 2 or more falls or a fall with injury in the past year?Yes Pt. Reports 3-4 falls in the last month , stating "I hurt something new each time I feel". Denies fractures from falls, feels leg weakness caused the falls  Prior Activity Level Community (5-7x/wk): Pt. reports he goes out 4x/ week to grocery and convenience stores. He drives his mother's car.   Home Assistive Devices / Equipment Home Assistive Devices/Equipment: Cane (specify quad or straight) Home Equipment: Cane - single point, Walker - 2 wheels  Prior Device Use: Indicate devices/aids used by the patient prior to current illness, exacerbation or injury? None of the above; reports using a cane most of time PTA  Prior Functional Level Prior Function Level of Independence: Independent with assistive device(s) Comments: Pt. reports he has felt bad over the last months and has not wanted to do much.   Self Care: Did the patient need help bathing, dressing, using the toilet or eating? Independent  Indoor Mobility: Did the patient need assistance with walking from room to room (with or without device)? Independent  Stairs: Did the patient need assistance with internal or external stairs (with or without device)? Independent  Functional  Cognition: Did the patient need help planning regular tasks such as  shopping or remembering to take medications? Independent  Current Functional Level Cognition  Overall Cognitive Status: Within Functional Limits for tasks assessed Orientation Level: Oriented X4   Extremity Assessment (includes Sensation/Coordination)  Upper Extremity Assessment: Generalized weakness  Lower Extremity Assessment: Defer to PT evaluation, Generalized weakness (R knee buckling)    ADLs  Overall ADL's : Needs assistance/impaired Grooming: Set up, Supervision/safety, Standing Upper Body Bathing: Minimal assitance, Sitting Lower Body Bathing: Moderate assistance, Sit to/from stand Upper Body Dressing : Minimal assistance, Sitting Upper Body Dressing Details (indicate cue type and reason): mod A for donning/doffing brace Lower Body Dressing: Moderate assistance, Sit to/from stand Toilet Transfer: Moderate assistance, Ambulation (simulated) Toileting- Clothing Manipulation and Hygiene: Total assistance Toileting - Clothing Manipulation Details (indicate cue type and reason): incontinent Functional mobility during ADLs: +2 for physical assistance, +2 for safety/equipment, Moderate assistance General ADL Comments: +2 for safety and managemtn of equipmetn. Mod A to power up from sitting    Mobility  Overal bed mobility: Needs Assistance Bed Mobility: Sidelying to Sit, Rolling Rolling: Min assist Sidelying to sit: Min assist General bed mobility comments: cuing for technique and practice    Transfers  Overall transfer level: Needs assistance Equipment used: Rolling walker (2 wheeled) Transfers: Sit to/from Stand Sit to Stand: Mod assist, +2 safety/equipment General transfer comment: cues for hand placement; poor carry over from previous    Ambulation / Gait / Stairs / Wheelchair Mobility  Ambulation/Gait Ambulation/Gait assistance: Min assist, +2 physical assistance (moderate assist when LE buckles) Ambulation Distance (Feet): 80 Feet Assistive device:  Rolling walker (2 wheeled) Gait Pattern/deviations: Step-through pattern, Decreased stride length, Drifts right/left General Gait Details: unsteady gait, decreased activity tolerance with increased WOB and DOE. Patient also with multiple occurances of RLE buckling during ambulation requiring increased moderate assist to maintain stability and prevent fall. Patient with history of multiple falls  Gait velocity: slow    Posture / Balance Balance Overall balance assessment: History of Falls Sitting-balance support: No upper extremity supported, Feet supported Sitting balance-Leahy Scale: Good Standing balance-Leahy Scale: Fair Standing balance comment: can not accept challenge    Special needs/care consideration BiPAP/CPAP no Continuous Drip IV no Dialysis no  Life Vest no Oxygen no Special Bed no  Skin Pt. Denies any skin issues  Bowel mgmt: Last BM not documented in chart; pt. Cannot recall last BM Bladder mgmt: Foley DC'd this am for voiding trials Diabetic mgmt no     Previous Home Environment Living Arrangements: (Aunt also in the home) Available Help at Discharge: Family Type of Home: House Home Layout: One level Home Access: Stairs to enter Entrance Stairs-Rails: Right, Left Entrance Stairs-Number of Steps: 6 Bathroom Shower/Tub: Chiropodist: Standard Home Care Services: No  Discharge Living Setting Plans for Discharge Living Setting: Patient's home Type of Home at Discharge: House Discharge Home Layout: One level Discharge Home Access: Stairs to enter Entrance Stairs-Rails: Right, Left Entrance Stairs-Number of Steps: 4-5 per pt. Discharge Bathroom Shower/Tub: Tub/shower unit Discharge Bathroom Toilet: Standard Discharge Bathroom Accessibility: Yes How Accessible: Accessible via walker Does the patient have any problems obtaining your medications?: No  Social/Family/Support  Systems Patient Roles: Parent (pt. has an adult son) Anticipated Caregiver: pt's Mom, Joslyn Hy Anticipated Caregiver's Contact Information: Joslyn Hy, [phone] Ability/Limitations of Caregiver: pt. reports she is in "[facility]" Caregiver Availability: Intermittent Discharge Plan Discussed with Primary Caregiver: No (pt. had mod I goals) Does Caregiver/Family  have Issues with Lodging/Transportation while Pt is in Rehab?: No   Goals/Additional Needs Patient/Family Goal for Rehab: mod I PT/OT; n/a SLP Expected length of stay: 10-12 days Cultural Considerations: none Dietary Needs: regular diet, thin liquids Equipment Needs: TBA Additional Information: Per. Dr. Sloan Leiter, pt. will need to follow up with oncologist in [place] post rehab Pt/Family Agrees to Admission and willing to participate: Yes Program Orientation Provided & Reviewed with Pt/Caregiver Including Roles & Responsibilities: Yes   Decrease burden of Care through IP rehab admission: no   Possible need for SNF placement upon discharge: Not anticipated   Patient Condition: This patient's condition remains as documented in the consult dated 10/28/14 , in which the Rehabilitation Physician determined and documented that the patient's condition is appropriate for intensive rehabilitative care in an inpatient rehabilitation facility. Will admit to inpatient rehab today.  Preadmission Screen Completed By: Edward Duran, 10/28/2014 1:09 PM ______________________________________________________________________  Discussed status with Dr. Naaman Plummer on 10/28/14 at 1308 and received telephone approval for admission today.  Admission Coordinator: Edward Duran, time 1960 Sudie Grumbling 10/28/14          Cosigned by: Meredith Staggers, MD at 10/28/2014 1:55 PM  Revision History

## 2014-10-29 NOTE — Progress Notes (Addendum)
[name] REHABILITATION     PROGRESS NOTE    Subjective/Complaints: Fairly uneventful night. Slept well. Pain controlled. Excited to get started with therapies.  ROS: Pt denies fever, rash/itching, headache, blurred or double vision, nausea, vomiting, abdominal pain, diarrhea, chest pain, shortness of breath, palpitations, dysuria, dizziness, neck pain, bleeding, anxiety, or depression. +wheezing (similar at home)   Objective: Vital Signs: Blood pressure 108/79, pulse 97, temperature 97.9 F (36.6 C), temperature source Oral, resp. rate 18, height '5\' 6"'$  (1.676 m), weight 55.6 kg (122 lb 9.2 oz), SpO2 99 %. Ct Lumbar Spine Wo Contrast  10/28/2014   CLINICAL DATA:  New onset lumbar spine instability. Recent lumbar surgery for pathologic L3 compression fracture. Newly discovered lung mass.  EXAM: CT LUMBAR SPINE WITHOUT CONTRAST  TECHNIQUE: Multidetector CT imaging of the lumbar spine was performed without intravenous contrast administration. Multiplanar CT image reconstructions were also generated.  COMPARISON:  Lumbar spine MRI 10/21/2014. CT abdomen and pelvis 10/21/2014.  FINDINGS: There is mild lumbar levoscoliosis, less pronounced than on the preoperative CT. Alignment on sagittal images does not appear significantly changed.  Sequelae of interval L3 corpectomy are identified with cage placement for treatment of the previously described pathologic L3 compression fracture. The inferior aspect of the cage extends approximately 6 mm posterior to the margin of the L4 vertebral body. Destructive changes are again seen extending into the left aspect of the residual L3 vertebral body related to previously described tumor. Evaluation for residual epidural and paravertebral tumor is limited on this noncontrast CT. There has been wide posterior decompression at L3, with laminectomies at L2 as well. Two small caliber surgical drains are in place. Posterior fusion has been performed from  L1-L5 with bilateral pedicle screws at L1, L2, L4, and L5. Screws and connecting rods appear intact. There is a new mildly displaced fracture of the right L4 transverse process.  There is no evidence of new lumbar compression fracture. Minimal anterior wedging of the T11 vertebral body is unchanged. 1 cm lesion seen in the T10 vertebral body on MRI is not clearly identified by CT. Evaluation of the spinal canal is limited on this noncontrast CT, particularly at the postoperative levels given associated streak artifact. Lumbar degenerative changes, including moderate spinal stenosis at L4-5, are better evaluated on recent MRI.  Right larger than left pleural effusions are partially visualized at the lung bases, much larger on the right than on the prior CT and new on the left. There is rather extensive atherosclerotic calcification of the abdominal aorta and its major branch vessels.  IMPRESSION: 1. Interval L3 corpectomy, posterior decompression, and L1-L5 posterior fusion for treatment of pathologic L3 compression fracture as above. Limited evaluation of the spinal canal and for residual tumor on this unenhanced CT. 2. Mildly displaced right L4 transverse process fracture. 3. New/increased pleural effusions, right larger than left.   Electronically Signed   By: Logan Bores M.D.   On: 10/28/2014 08:13    Recent Labs  10/27/14 0650 10/29/14 0547  WBC 7.5 11.3*  HGB 8.8* 9.6*  HCT 26.3* 29.0*  PLT 134* 209    Recent Labs  10/27/14 0650 10/29/14 0547  NA 135 133*  K 3.5 3.4*  CL 104 102  GLUCOSE 117* 120*  BUN 10 13  CREATININE 1.12 1.09  CALCIUM 7.8* 8.0*   CBG (last 3)   Recent Labs  10/27/14 0753 10/28/14 0804  GLUCAP 113* 132*    Wt Readings from Last 3 Encounters:  10/28/14 55.6 kg (122 lb 9.2 oz)  10/23/14 57.6 kg (126 lb 15.8 oz)  09/10/14 58.968 kg (130 lb)    Physical Exam:  Constitutional: He is oriented to person, place, and time.  59 year old male appearing much  older than stated age, emaciated HENT: dentition poor Head: Normocephalic.  Eyes: EOM are normal.  Neck: Normal range of motion. Neck supple. No thyromegaly present.  Cardiovascular: Normal rate and regular rhythm.  Respiratory: Effort normal. Diffuse ins/exp wheezes. No respiratory distress.  GI: Soft. Bowel sounds are normal. He exhibits no distension.  Neurological: He is alert and oriented to person, place, and time.  UE 4/5 delt,bicep,tricep,wrists,hand;, Bilateral LE: 3+hf. 4-ke and ad/apf. No sensory findings. Limited by back pain. Cognitively was intact.  Skin:  Back incision is clean and dry  Assessment/Plan: 1. Functional deficits secondary to pathological L3 fracture which require 3+ hours per day of interdisciplinary therapy in a comprehensive inpatient rehab setting. Physiatrist is providing close team supervision and 24 hour management of active medical problems listed below. Physiatrist and rehab team continue to assess barriers to discharge/monitor patient progress toward functional and medical goals.  Function:  Bathing Bathing position   Position: Wheelchair/chair at sink  Bathing parts Body parts bathed by patient: Right arm, Left arm, Chest, Abdomen, Front perineal area, Right upper leg, Left upper leg Body parts bathed by helper: Buttocks, Right lower leg, Left lower leg, Back  Bathing assist        Upper Body Dressing/Undressing Upper body dressing   What is the patient wearing?: Orthosis             Orthosis activity level: Performed by helper  Upper body assist Assist Level: Touching or steadying assistance(Pt > 75%)      Lower Body Dressing/Undressing Lower body dressing   What is the patient wearing?: Hospital Gown, Non-skid slipper socks           Non-skid slipper socks- Performed by helper: Don/doff right sock, Don/doff left sock                  Lower body assist Assist Level: Touching or steadying assistance (Pt > 75%)       Toileting Toileting Toileting activity did not occur: N/A        Toileting assist     Transfers Chair/bed transfer   Chair/bed transfer method: Stand pivot Chair/bed transfer assist level: Touching or steadying assistance (Pt > 75%) Chair/bed transfer assistive device: Probation officer Comprehension Comprehension assist level: Understands complex 90% of the time/cues 10% of the time  Expression Expression assist level: Expresses complex 90% of the time/cues < 10% of the time  Social Interaction Social Interaction assist level: Interacts appropriately 90% of the time - Needs monitoring or encouragement for participation or interaction.  Problem Solving Problem solving assist level: Solves basic problems with no assist  Memory Memory assist level: Recognizes or recalls 75 - 89% of the time/requires cueing 10 - 24% of the time    Medical Problem List and Plan: 1. 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   -begin therapies today  -pt needs follow up with Dr. [facility] on discharge 2. DVT Prophylaxis/Anticoagulation: SCDs. Check vascular study today given vte risk 3. Pain Management: Oxycodone as needed. Monitor with increased mobility 4. Acute  blood loss anemia. Follow-up labs personally reviewed---up to 9.6 hgb 5. Neuropsych: This patient is capable of making decisions on his own behalf. 6. Skin/Wound Care: Routine skin checks. nutrition 7. Fluids/Electrolytes/Nutrition: Routine I&O. Labs reviewed---replace k+ 8. Urinary retention. Continue Flomax. Check PVRs 3. pvr 189 last noc 9. COPD/ Tobacco abuse. Prn breathing treatments, oxygen  -comfortable at present 10. Hypertension. No current antihypertensives medication. Patient on Benicar 20-12 0.5 mg daily prior to admission. Monitor with increased mobility 11. Decreased nutritional storage. Continue  nutritional supplements and encouarge po. 12. Leukocytosis: ?reactive, aftebrile. Re-check Thursday  LOS (Days) 1 A FACE TO FACE EVALUATION WAS PERFORMED  Hanan Moen T 10/29/2014 8:48 AM

## 2014-10-29 NOTE — Progress Notes (Signed)
*  Preliminary Results* Bilateral lower extremity venous duplex completed. Bilateral lower extremities are negative for deep vein thrombosis. There is no evidence of Baker's cyst bilaterally.  10/29/2014  Maudry Mayhew, RVT, RDCS, RDMS

## 2014-10-29 NOTE — Progress Notes (Addendum)
[facility] Individual Statement of Services  Patient Name:  Edward Duran  Date:  10/29/2014  Welcome to the [facility].  Our goal is to provide you with an individualized program based on your diagnosis and situation, designed to meet your specific needs.  With this comprehensive rehabilitation program, you will be expected to participate in at least 3 hours of rehabilitation therapies Monday-Friday, with modified therapy programming on the weekends.  Your rehabilitation program will include the following services:  Physical Therapy (PT), Occupational Therapy (OT), 24 hour per day rehabilitation nursing, Case Management (Social Worker), Rehabilitation Medicine, Nutrition Services and Pharmacy Services  Weekly team conferences will be held on Tuesdays to discuss your progress.  Your Social Worker will talk with you frequently to get your input and to update you on team discussions.  Team conferences with you and your family in attendance may also be held.  Expected length of stay:  7 to 10 days  Overall anticipated outcome:  Supervision  Depending on your progress and recovery, your program may change. Your Social Worker will coordinate services and will keep you informed of any changes. Your Social Worker's name and contact numbers are listed  below.  The following services may also be recommended but are not provided by the [facility] will be made to provide these services after discharge if needed.  Arrangements include referral to agencies that provide these services.  Your insurance has been verified to be:  None at this time, but you have applied for Medicaid. Your primary doctor is:  [facility]  Pertinent information will be shared with your doctor and your insurance company.  Social  Worker:  Alfonse Alpers, LCSW  [phone] or [phone]  Information discussed with and copy given to patient by: Trey Sailors, 10/29/2014, 1:22 PM

## 2014-10-29 NOTE — Progress Notes (Signed)
Initial Nutrition Assessment  DOCUMENTATION CODES:   Severe malnutrition in context of chronic illness  INTERVENTION:   Continue Boost Plus po TID, each supplements provides 360 kcal and 14 grams of protein.   Encourage adequate PO intake.   NUTRITION DIAGNOSIS:   Malnutrition related to chronic illness as evidenced by severe depletion of body fat, severe depletion of muscle mass.  GOAL:   Patient will meet greater than or equal to 90% of their needs  MONITOR:   PO intake, Supplement acceptance, Weight trends, Labs, I & O's  REASON FOR ASSESSMENT:   Malnutrition Screening Tool    ASSESSMENT:   59 y.o. right handed male with history of hypertension, COPD, tobacco abuse. Patient lives with his 30 year old mother independent with a single-point cane sometimes requiring walker prior to admission. Presented 10/21/2014 with progressive low back pain 2 months and right lower extremity weakness as well as a 40 pound weight loss over the past few months. He denied bowel or bladder incontinence. X-ray/MRI and imaging revealed pathologic fracture of L3 with tumor invasion extending into the posterior elements and transverse process on the right. 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 In addition a nonenhancing lesion at L2 and T10 was seen suspicious for metastatic disease.  Meal completion 10%. Pt reports appetite is just "okay". PTA pt reports eating with with no other difficulties. Pt does reports consuming Ensure at home. Usual body weight reported to be 128 lbs. Pt with weight loss, however not found significant. Pt currently has Boost Plus ordered and has been consuming them. RD to continue with current orders. Pt was encouraged to eat his food at meals and to drink his supplements.   Nutrition-Focused physical exam completed. Findings are severe fat depletion, severe muscle depletion, and no edema.   Labs and medications  reviewed.   Diet Order:  Diet regular Room service appropriate?: Yes; Fluid consistency:: Thin  Skin:   (Incision on back, wound on L ear)  Last BM:  9/10  Height:   Ht Readings from Last 1 Encounters:  10/28/14 '5\' 6"'$  (1.676 m)    Weight:   Wt Readings from Last 1 Encounters:  10/28/14 122 lb 9.2 oz (55.6 kg)    Ideal Body Weight:  64.5 kg  BMI:  Body mass index is 19.79 kg/(m^2).  Estimated Nutritional Needs:   Kcal:  1800-2000  Protein:  80-90 grams  Fluid:  1.8 - 2 L/day  EDUCATION NEEDS:   No education needs identified at this time  Corrin Parker, MS, RD, LDN Pager # [phone] After hours/ weekend pager # [phone]

## 2014-10-29 NOTE — Evaluation (Signed)
Physical Therapy Assessment and Plan  Patient Details  Name: Edward Duran MRN: [id number] Date of Birth: 1955/11/24  PT Diagnosis: Abnormal posture, Abnormality of gait, Coordination disorder, Difficulty walking, Low back pain, Muscle weakness and Pain in lower back Rehab Potential: Good ELOS: 7-10  Today's Date: 10/29/2014 PT Individual Time: 0900-1030 and 1515-1600 PT Individual Time Calculation (min): 90 min and 45 min    Problem List:  Patient Active Problem List   Diagnosis Date Noted  . Lung mass 10/28/2014  . Debility 10/28/2014  . Metastatic cancer to spine 10/23/2014  . Shock circulatory 10/23/2014  . Respiratory failure, post-operative 10/23/2014  . Hypomagnesemia 10/23/2014  . HTN (hypertension) 10/21/2014  . 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 Hilar mass 10/21/2014  . COPD (chronic obstructive pulmonary disease) 10/21/2014  . Leg weakness 10/21/2014  . AKI (acute kidney injury) 10/21/2014  . Hyperglycemia 10/21/2014  . Pathological fracture 10/21/2014  . Carotid artery disease     Past Medical History:  Past Medical History  Diagnosis Date  . COPD (chronic obstructive pulmonary disease)   . Hypertension   . Stroke   . Cancer   . Carotid artery disease    Past Surgical History: No past surgical history on file.  Assessment & Plan Clinical Impression: Edward Duran is a 59 y.o. right handed male with history of hypertension, COPD, tobacco abuse. Patient lives with his 53 year old mother independent with a single-point cane sometimes requiring walker prior to admission. Presented 10/21/2014 with progressive low back pain 2 months and right lower extremity weakness as well as a 40 pound weight loss over the past few months. He denied bowel or bladder incontinence. X-ray/MRI and imaging revealed pathologic fracture of L3 with tumor invasion extending into the posterior elements and transverse process on the right.  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 In addition a nonenhancing lesion at L2 and T10 was seen suspicious for metastatic disease. An incidental right lower lobe lesion posteriorly measuring 2 x 2.5 cm with right hilar adenopathy noted. CT abdomen and pelvis remarkable for noted findings as well as complete collapse of the visualized right middle lobe likely related to central obstruction by the known lung mass. CT of the head negative for intracranial malignancy.. Underwent laminectomy inferior L2, L3 superior L4 for decompression and tumor resection, L3 corpectomy with anterior fusion L2-L4, internal fixation with pedicle screws at L1, L2, L4, L5. 10/23/2014 per Dr. Cyndy Freeze. Pap cytology report pending. Hospital course pain management. Acute blood loss anemia patient did require 7 units packed red blood cells and 1 unit fresh frozen plasma in the OR. Postoperative respiratory failure requiring full ventilatory support and slowly weaned from mechanical support. Oncology services Dr. Lindi Adie consulted and await pathology report and plan of care and patient to follow-up [facility]. Urinary retention with Flomax added. Physical therapy evaluation completed ongoing with recommendations of physical medicine rehabilitation consult. Patient transferred to CIR on 10/28/2014 .   Patient currently requires min with mobility secondary to muscle weakness, decreased cardiorespiratoy endurance and decreased sitting balance, decreased standing balance, decreased postural control, decreased balance strategies and difficulty maintaining precautions.  Prior to hospitalization, patient was modified independent  with mobility and lived with Family (mother, home days) in a House.  Home access is 3 up from sidewalk +5 up to house Stairs to enter.  Patient will benefit from skilled PT intervention to  maximize safe functional  mobility, minimize fall risk and decrease caregiver burden for planned discharge home with 24 hour supervision.  Anticipate patient will benefit from follow up [place] at discharge.  PT - End of Session Activity Tolerance: Tolerates 30+ min activity with multiple rests Endurance Deficit: Yes Endurance Deficit Description: increased WOB with exertion  PT Assessment Rehab Potential (ACUTE/IP ONLY): Good Barriers to Discharge: Decreased caregiver support;Inaccessible home environment PT Patient demonstrates impairments in the following area(s): Balance;Endurance;Motor;Pain;Safety PT Transfers Functional Problem(s): Bed Mobility;Bed to Chair;Car PT Locomotion Functional Problem(s): Stairs;Wheelchair Mobility;Ambulation  PT Plan PT Intensity: Minimum of 1-2 x/day ,45 to 90 minutes PT Frequency: 5 out of 7 days PT Duration Estimated Length of Stay: 10-12  PT Treatment/Interventions: Ambulation/gait training;Balance/vestibular training;Discharge planning;Neuromuscular re-education;Community reintegration;Functional mobility training;DME/adaptive equipment instruction;Patient/family education;Psychosocial support;Stair training;Therapeutic Activities;Therapeutic Exercise;UE/LE Strength taining/ROM;UE/LE Coordination activities;Wheelchair propulsion/positioning  PT Transfers Anticipated Outcome(s): mod I PT Locomotion Anticipated Outcome(s): supervision  PT Recommendation Follow Up Recommendations: Home health PT;24 hour supervision/assistance Patient destination: Home Equipment Recommended: To be determined Equipment Details: may need w/c for community distances, TBD  Skilled Therapeutic Intervention Session 1: Pt received sitting in w/c and agreeable to therapy evaluation.  PT instructed patient in transfers, bed mobility, gait, w/c mobility, stair negotiation, and car transfers with details below. Pt is min A overall but requires increased assist with transfers from low surfaces.  Plan to assess  balance in PM session.  PT reviewed role of PT, goals, and POC with patient.  Pt propelled w/c back to room and positioned supine in bed with call bell in reach and needs met.    Session 2: Pt received supine in bed and agreeable to therapy.  PT instructed patient in log roll to transition to sitting EOB with supervision and verbal/tactile cues for technique. Lumbar corset donned by therapist while pt sitting EOB.  Pt performs sit<>stand with supervision and amb to w/c.  PT instructs pt in efficient w/c propulsion with UEs and pt propels w/c x100' before stating he cannot continue due to fatigue.  PT instructed patient in TUG with results noted below.  Pt propelled w/c back to room and amb to bathroom.  Pt toileted with supervision and verbal cues for safety and returned to bed. PT instructed patient in sit>supine using log roll technique with verbal and tactile cues for maintaining back precautions.  Pt positioned to comfort with call bel in reach and needs met.   PT Evaluation Precautions/Restrictions Precautions Precautions: Back;Fall Precaution Comments: pt able to recall 1/3 back precautions without cuing Required Braces or Orthoses: Spinal Brace Spinal Brace: Lumbar corset Restrictions Weight Bearing Restrictions: No General Chart Reviewed: Yes Vital Signs Therapy Vitals Pulse Rate: (!) 115 Patient Position (if appropriate): Sitting SpO2: 100 %  Pain Pain Assessment Pain Assessment: 0-10 Pain Score: 5  Pain Type: Acute pain Pain Location: Back Pain Orientation: Lower Pain Descriptors / Indicators: Aching Pain Frequency: Intermittent Pain Onset: On-going Pain Intervention: emotional support, RN notified  Home Living/Prior Functioning Home Living Available Help at Discharge: Family Type of Home: House Home Access: Stairs to enter [employer] of Steps: 3 up from sidewalk +5 up to house  Entrance Stairs-Rails: Right;Left Home Layout: One level Lives With: Family  (mother, home days) Prior Function Level of Independence: Independent with gait;Independent with transfers;Other (comment) (intermittent use of SPC and RW)  Able to Take Stairs?: Yes Driving: Yes Vocation: Retired Tax adviser Overall Cognitive Status: Within Functional Limits for tasks assessed Arousal/Alertness: Awake/alert Orientation Level:  Oriented X4 Memory: Impaired Memory Impairment: Decreased recall of new information;Decreased short term memory Decreased Short Term Memory: Functional basic Awareness: Appears intact  Sensation Sensation Light Touch: Appears Intact  Coordination Gross Motor Movements are Fluid and Coordinated: No Fine Motor Movements are Fluid and Coordinated: No (finger opposition decreased accurary on R) Finger Nose Finger Test: impaired accuracy and speed on R, L normal Heel Shin Test: dereased accuracy R>L  Mobility Bed Mobility Bed Mobility: Rolling Right;Rolling Left;Left Sidelying to Sit;Sit to Sidelying Left Rolling Right: 4: Min guard Rolling Left: 4: Min guard Left Sidelying to Sit: 4: Min guard Sit to Sidelying Left: 4: Min assist Sit to Sidelying Left Details: Tactile cues for posture;Tactile cues for placement;Verbal cues for technique;Verbal cues for sequencing;Manual facilitation for placement  Transfers Transfers: Yes Sit to Stand: 3: Mod assist Sit to Stand Details: Manual facilitation for weight shifting;Verbal cues for technique;Verbal cues for sequencing;Verbal cues for safe use of DME/AE Stand to Sit: 4: Min assist Stand to Sit Details (indicate cue type and reason): Verbal cues for technique;Verbal cues for sequencing  Locomotion  Ambulation Ambulation: Yes Ambulation/Gait Assistance: 4: Min assist Ambulation Distance (Feet): 100 Feet Assistive device: Straight cane Ambulation/Gait Assistance Details: Tactile cues for weight shifting;Verbal cues for technique Ambulation/Gait Assistance Details: slow,  decreased weight shift to R  Gait Gait: Yes Gait Pattern: Impaired Gait Pattern: Step-to pattern;Decreased step length - right;Decreased step length - left;Decreased weight shift to right;Decreased trunk rotation  Stairs / Additional Locomotion Stairs: Yes Stairs Assistance: 4: Min assist Stairs Assistance Details: Verbal cues for sequencing;Verbal cues for technique;Verbal cues for precautions/safety Stair Management Technique: Two rails;Alternating pattern;Forwards Number of Stairs: 8 Height of Stairs: 3  Architect: Yes Wheelchair Assistance: 5: Careers information officer: Both upper extremities Wheelchair Parts Management: Needs assistance (leg rest management 2/2 back precautions) Distance: 125   Trunk/Postural Assessment  Cervical Assessment Cervical Assessment: Exceptions to [facility] (minimal forward head posture) Lumbar Assessment Lumbar Assessment:  (posterior pelvic tilt in sitting, AROM not assessed 2/2 lumbar precautions) Postural Control Postural Control: Deficits on evaluation Trunk Control: requires steady/min A to maintain standing balance Righting Reactions: delayed   Balance Balance Balance Assessed: Yes Standardized Balance Assessment Standardized Balance Assessment: Timed Up and Go Test Timed Up and Go Test TUG: Normal TUG Normal TUG (seconds): 36.08 Static Standing Balance Static Standing - Balance Support: No upper extremity supported Static Standing - Level of Assistance: 5: Stand by assistance Static Standing - Comment/# of Minutes: 1 Static Stance: Eyes closed Static Stance: Eyes Closed: 30 seconds, supervision Static Stance: on Foam: 30 seconds, supervision Static Stance: on Foam, Eyes Closed: 10 seconds, min A Dynamic Standing Balance Dynamic Standing - Balance Support: Bilateral upper extremity supported Dynamic Standing - Level of Assistance: 4: Min assist Dynamic Standing - Balance Activities: Compliant  surfaces   Extremity Assessment   RLE Assessment RLE Assessment: Exceptions to [facility] RLE Strength RLE Overall Strength Comments: hip flexion 3/5, knee extension 3+/5, knee flexion 3+/5, DF 4/5, PF 4/5   LLE Assessment LLE Assessment: Exceptions to [facility] LLE Strength LLE Overall Strength Comments: hip flexion 3/5, knee extension 3+/5, knee flexion 3+/5, DF 4/5, PF 4/5    See Function Navigator for Current Functional Status.   Refer to Care Plan for Long Term Goals  Recommendations for other services: None  Discharge Criteria: Patient will be discharged from PT if patient refuses treatment 3 consecutive times without medical reason, if treatment goals not met, if there is a change  in medical status, if patient makes no progress towards goals or if patient is discharged from hospital.  The above assessment, treatment plan, treatment alternatives and goals were discussed and mutually agreed upon: by patient  Earnest Conroy Penven-Crew 10/29/2014, 4:18 PM

## 2014-10-29 NOTE — Progress Notes (Signed)
Meredith Staggers, MD Physician Signed Physical Medicine and Rehabilitation Consult Note 10/28/2014 5:46 AM  Related encounter: ED to Hosp-Admission (Discharged) from 10/21/2014 in [place] Collapse All        Physical Medicine and Rehabilitation Consult Reason for Consult: Pathologic fracture L3, extradural mass, spinal stenosis with radiculopathy Referring Physician: [facility]   HPI: Edward Duran is a 59 y.o. right handed male with history of hypertension, COPD, tobacco abuse. Patient lives with his 7 year old mother independent with a single-point cane sometimes requiring walker prior to admission. Presented 10/21/2014 with progressive low back pain 2 months and right lower extremity weakness as well as a 40 pound weight loss over the past few months. He denied bowel or bladder incontinence. X-ray/MRI and imaging revealed pathologic fracture of L3 with tumor invasion extending into the posterior elements and transverse process on the right. 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 In addition a nonenhancing lesion at L2 and T10 was seen suspicious for metastatic disease. An incidental right lower lobe lesion posteriorly measuring 2 x 2.5 cm with right hilar adenopathy noted. CT abdomen and pelvis remarkable for noted findings as well as complete collapse of the visualized right middle lobe likely related to central obstruction by the known lung mass. CT of the head negative for intracranial malignancy.. Underwent laminectomy inferior L2, L3 superior L4 for decompression and tumor resection, L3 corpectomy with anterior fusion L2-L4, internal fixation with pedicle screws at L1, L2, L4, L5. 10/23/2014 per Dr. Cyndy Freeze. Pap cytology report pending. Hospital course pain management. Acute blood loss anemia patient did require 7 units packed red blood cells and 1 unit fresh frozen plasma in the OR.  Postoperative respiratory failure requiring full ventilatory support and slowly weaned from mechanical support. Oncology services Dr. Lindi Adie consulted and await plan of care. Urinary retention with Flomax added. Physical therapy evaluation completed ongoing with recommendations of physical medicine rehabilitation consult.   Review of Systems  Constitutional: Negative for fever and chills.  HENT: Negative for hearing loss.  Eyes: Negative for blurred vision and double vision.  Respiratory: Positive for cough.   Shortness of breath with heavy exertion  Cardiovascular: Negative for chest pain, palpitations and leg swelling.  Gastrointestinal: Positive for nausea, vomiting and constipation. Negative for abdominal pain.  Genitourinary: Positive for urgency. Negative for hematuria.  Musculoskeletal: Positive for myalgias and back pain.   Right lower extremity weakness  Skin: Negative for rash.  Neurological: Positive for weakness. Negative for dizziness, tingling, seizures and headaches.   Past Medical History  Diagnosis Date  . COPD (chronic obstructive pulmonary disease)   . Hypertension   . Stroke   . Cancer   . Carotid artery disease    History reviewed. No pertinent past surgical history. History reviewed. No pertinent family history. Social History:  reports that he has been smoking. He does not have any smokeless tobacco history on file. He reports that he uses illicit drugs. He reports that he does not drink alcohol. Allergies: No Known Allergies Medications Prior to Admission  Medication Sig Dispense Refill  . budesonide-formoterol (SYMBICORT) 160-4.5 MCG/ACT inhaler Inhale 2 puffs into the lungs 2 (two) times daily.    Marland Kitchen ezetimibe-simvastatin (VYTORIN) 10-20 MG per tablet Take 1 tablet by mouth daily.    Marland Kitchen olmesartan-hydrochlorothiazide (BENICAR HCT) 20-12.5 MG per tablet Take 1 tablet by mouth daily.      Home: Home  Living Family/patient  expects to be discharged to:: Private residence Living Arrangements: Parent (66 y/o Mom in relatively good health) Available Help at Discharge: Family Type of Home: House Home Access: Stairs to enter Technical brewer of Steps: 6 Entrance Stairs-Rails: Right, Left Home Layout: One level Bathroom Shower/Tub: Chiropodist: Standard Home Equipment: [place] - single point, Environmental consultant - 2 wheels  Functional History: Prior Function Level of Independence: Independent with assistive device(s) Functional Status:  Mobility: Bed Mobility Overal bed mobility: Needs Assistance Bed Mobility: Sidelying to Sit, Rolling Rolling: Min assist Sidelying to sit: Min assist General bed mobility comments: cuing for technique and practice Transfers Overall transfer level: Needs assistance Equipment used: Rolling walker (2 wheeled) Transfers: Sit to/from Stand Sit to Stand: Mod assist, +2 safety/equipment General transfer comment: cues for hand placement; poor carry over from previous Ambulation/Gait Ambulation/Gait assistance: Min assist, +2 physical assistance (moderate assist when LE buckles) Ambulation Distance (Feet): 80 Feet Assistive device: Rolling walker (2 wheeled) Gait Pattern/deviations: Step-through pattern, Decreased stride length, Drifts right/left General Gait Details: unsteady gait, decreased activity tolerance with increased WOB and DOE. Patient also with multiple occurances of RLE buckling during ambulation requiring increased moderate assist to maintain stability and prevent fall. Patient with history of multiple falls  Gait velocity: slow    ADL: ADL Overall ADL's : Needs assistance/impaired Grooming: Set up, Supervision/safety, Standing Upper Body Bathing: Minimal assitance, Sitting Lower Body Bathing: Moderate assistance, Sit to/from stand Upper Body Dressing : Minimal assistance, Sitting Upper Body Dressing Details (indicate cue  type and reason): mod A for donning/doffing brace Lower Body Dressing: Moderate assistance, Sit to/from stand Toilet Transfer: Moderate assistance, Ambulation (simulated) Toileting- Clothing Manipulation and Hygiene: Total assistance Toileting - Clothing Manipulation Details (indicate cue type and reason): incontinent Functional mobility during ADLs: +2 for physical assistance, +2 for safety/equipment, Moderate assistance General ADL Comments: +2 for safety and managemtn of equipmetn. Mod A to power up from sitting  Cognition: Cognition Overall Cognitive Status: Within Functional Limits for tasks assessed Orientation Level: Oriented X4 Cognition Arousal/Alertness: Awake/alert Behavior During Therapy: WFL for tasks assessed/performed Overall Cognitive Status: Within Functional Limits for tasks assessed  Blood pressure 129/60, pulse 96, temperature 98.2 F (36.8 C), temperature source Oral, resp. rate 17, height '5\' 6"'$  (1.676 m), weight 57.6 kg (126 lb 15.8 oz), SpO2 97 %. Physical Exam  Constitutional: He is oriented to person, place, and time.  60 year old male appearing older than stated age  HENT:  Head: Normocephalic.  Eyes: EOM are normal.  Neck: Normal range of motion. Neck supple. No thyromegaly present.  Cardiovascular: Normal rate and regular rhythm.  Respiratory: Effort normal and breath sounds normal. No respiratory distress.  GI: Soft. Bowel sounds are normal. He exhibits no distension.  Neurological: He is alert and oriented to person, place, and time.  UE 4/5. LE: 3+hf. 4-ke and ad/apf. No sensory findings. Limited by back pain. Cognitively was intact.  Skin:  Back incision is dressed     Lab Results Last 24 Hours    Results for orders placed or performed during the hospital encounter of 10/21/14 (from the past 24 hour(s))  CBC Status: Abnormal   Collection Time: 10/27/14 6:50 AM  Result Value Ref Range   WBC 7.5 4.0 - 10.5 K/uL   RBC 3.07  (L) 4.22 - 5.81 MIL/uL   Hemoglobin 8.8 (L) 13.0 - 17.0 g/dL   HCT 26.3 (L) 39.0 - 52.0 %   MCV 85.7 78.0 - 100.0 fL   MCH 28.7 26.0 -  34.0 pg   MCHC 33.5 30.0 - 36.0 g/dL   RDW 15.8 (H) 11.5 - 15.5 %   Platelets 134 (L) 150 - 400 K/uL  Basic metabolic panel Status: Abnormal   Collection Time: 10/27/14 6:50 AM  Result Value Ref Range   Sodium 135 135 - 145 mmol/L   Potassium 3.5 3.5 - 5.1 mmol/L   Chloride 104 101 - 111 mmol/L   CO2 24 22 - 32 mmol/L   Glucose, Bld 117 (H) 65 - 99 mg/dL   BUN 10 6 - 20 mg/dL   Creatinine, Ser 1.12 0.61 - 1.24 mg/dL   Calcium 7.8 (L) 8.9 - 10.3 mg/dL   GFR calc non Af Amer >60 >60 mL/min   GFR calc Af Amer >60 >60 mL/min   Anion gap 7 5 - 15  Magnesium Status: None   Collection Time: 10/27/14 6:50 AM  Result Value Ref Range   Magnesium 2.1 1.7 - 2.4 mg/dL  Glucose, capillary Status: Abnormal   Collection Time: 10/27/14 7:53 AM  Result Value Ref Range   Glucose-Capillary 113 (H) 65 - 99 mg/dL      Imaging Results (Last 48 hours)    No results found.    Assessment/Plan: Diagnosis: pathological L3 fracture due to lung mass.  1. Does the need for close, 24 hr/day medical supervision in concert with the patient's rehab needs make it unreasonable for this patient to be served in a less intensive setting? Yes 2. Co-Morbidities requiring supervision/potential complications: copd, 3. Due to bladder management, bowel management, safety, skin/wound care, disease management, medication administration, pain management and patient education, does the patient require 24 hr/day rehab nursing? Yes 4. Does the patient require coordinated care of a physician, rehab nurse, PT (1-2 hrs/day, 5 days/week) and OT (1-2 hrs/day, 5 days/week) to address physical and functional deficits in the context of the above medical diagnosis(es)? Yes Addressing  deficits in the following areas: balance, endurance, locomotion, strength, transferring, bowel/bladder control, bathing, dressing, feeding, grooming, toileting and psychosocial support 5. Can the patient actively participate in an intensive therapy program of at least 3 hrs of therapy per day at least 5 days per week? Yes 6. The potential for patient to make measurable gains while on inpatient rehab is excellent 7. Anticipated functional outcomes upon discharge from inpatient rehab are modified independent with PT, modified independent with OT, n/a with SLP. 8. Estimated rehab length of stay to reach the above functional goals is: 10-12 days 9. Does the patient have adequate social supports and living environment to accommodate these discharge functional goals? Potentially 10. Anticipated D/C setting: Home 11. Anticipated post D/C treatments: HH therapy and Outpatient therapy 12. Overall Rehab/Functional Prognosis: excellent  RECOMMENDATIONS: This patient's condition is appropriate for continued rehabilitative care in the following setting: CIR Patient has agreed to participate in recommended program. Yes Note that insurance prior authorization may be required for reimbursement for recommended care.  Comment: Rehab Admissions Coordinator to follow up.  Thanks,  Meredith Staggers, MD, Mellody Drown     10/28/2014

## 2014-10-30 ENCOUNTER — Inpatient Hospital Stay (HOSPITAL_COMMUNITY): Payer: Medicaid Other | Admitting: Physical Therapy

## 2014-10-30 ENCOUNTER — Inpatient Hospital Stay (HOSPITAL_COMMUNITY): Payer: Medicaid Other | Admitting: Occupational Therapy

## 2014-10-30 ENCOUNTER — Encounter (HOSPITAL_COMMUNITY): Payer: Self-pay | Admitting: Neurological Surgery

## 2014-10-30 DIAGNOSIS — J42 Unspecified chronic bronchitis: Secondary | ICD-10-CM

## 2014-10-30 MED ORDER — METHOCARBAMOL 500 MG PO TABS
500.0000 mg | ORAL_TABLET | Freq: Four times a day (QID) | ORAL | Status: DC | PRN
  Administered 2014-10-30 – 2014-11-05 (×5): 500 mg via ORAL
  Filled 2014-10-30 (×6): qty 1

## 2014-10-30 NOTE — Progress Notes (Addendum)
Patient woke to use urinal, when pt stood RN noted serosanguinous drainage on bed pad.  Drainage leaking from lower back incision--JP drain dsg CDI.  4x4 gauze with abd pad placed over site--will continue to monitor.  Pt complaining of back pain, gave '5mg'$  Oxy IR.   Talked to Linna Hoff, [place]. MD to address in AM once on floor.  RN changed dressing again at 0655, drainage had soaked through to ABD pad and leaked some onto bed pad.  Will pass on to dayshift RN.

## 2014-10-30 NOTE — Progress Notes (Signed)
Physical Therapy Session Note  Patient Details  Name: Edward Duran MRN: [id number] Date of Birth: 02-Dec-1955  Today's Date: 10/30/2014 PT Individual Time: 1000-1100 and 1300-1400 PT Individual Time Calculation (min): 60 min and 60 min   Short Term Goals: Week 1:  PT Short Term Goal 1 (Week 1): Pt will demonstrate bed mobility with supervision. PT Short Term Goal 2 (Week 1): Pt will transfer with supervision and LRAD.  PT Short Term Goal 3 (Week 1): Pt will amb 150' with LRAD and supervision. PT Short Term Goal 4 (Week 1): Pt will negotiate 8 steps with supervision.  Skilled Therapeutic Interventions/Progress Updates:    Session 1: Pt received sitting in w/c and agreeable to therapy.  PT instructed patient in ambulation to therapy gym x175' and supervision with pt pushing w/c to simulate RW. PT fitted patient for RW and instructed patient in transfers and amb with RW in therapy gym.  Pt states he feels mor secure with RW and agreeable to use one at d/c.  PT instructed pt in BLE therex x15 heel/toe raises, LAQ, seated marching, and mini squats.  Pt required rest break after each exercise.  PT instructed patient in Nustep on 2 resistance for endurance and overall strength.  Pt demos decreased endurance, only able to tolerate 3 trials of 2.5 minutes.  Pt education provided for energy conservation and pursed lip breathing.  Pt amb from Nustep to w/c with RW and propelled w/c x175' back to room. Pt positioned in room in w/c with call bell in reach in and needs met.     Session 2: Pt received in w/c and agreeable to therapy.  Session focused on community level w/c training.  Pt continues to require assist with leg rests 2/2 back precautions and pain.  Pt is able to propel w/c 150 ft. or more and is able to turn around, maneuver to table, bed or toilet, maneuver on rugs and over door sills, and can negotiate a 3% grade with supervision and verbal cues.  Pt propelled >300' throughout community level  training on a variety of surfaces, through doorways, and negotiated 3% grade with verbal cues.  PT instructed patient in BUE and BLE therex x20 reps for endurance and strengthening: ankle pumps, seated marching, bicep curls, and shoulder abduction.  Pt amb from therapy gym back to room x175 feet with RW and supervision and positioned in bed with supervision and verbal cues for maintaining back precautions and left with call bell in reach and needs met.    Therapy Documentation Precautions:  Precautions Precautions: Back, Fall Precaution Comments: pt able to recall 1/3 back precautions without cuing Required Braces or Orthoses: Spinal Brace Spinal Brace: Lumbar corset Restrictions Weight Bearing Restrictions: No  Pain: Pain Assessment Pain Assessment: 0-10 Pain Score: 4  Pain Location: Back Pain Intervention(s): Emotional support   See Function Navigator for Current Functional Status.   Therapy/Group: Individual Therapy  Mario Voong E Penven-Crew 10/30/2014, 11:35 AM

## 2014-10-30 NOTE — Progress Notes (Signed)
Occupational Therapy Session Note  Patient Details  Name: Edward Duran MRN: [id number] Date of Birth: 08-18-1955  Today's Date: 10/30/2014 OT Individual Time: 1432-1500 OT Individual Time Calculation (min): 28 min    Short Term Goals: Week 1:  OT Short Term Goal 1 (Week 1): Pt will peform toileting with min A (3/3 steps) OT Short Term Goal 2 (Week 1): Pt will perform shower transfer with miin A OT Short Term Goal 3 (Week 1): PT will bathe at shower level with AE and remained seated with min A (due to back precautions) OT Short Term Goal 4 (Week 1): Pt will don LB clothing with AE with min A  Skilled Therapeutic Interventions/Progress Updates:    Upon entering the room, pt supine in bed with 5/10 c/o pain in lower back. Pt called for pain medication this session. Skilled OT intervention with focus on bed mobility while maintaining back precautions, donning and doffing of lumbar corset, functional transfers, and energy conservation education. Pt performed supine <>sit with min verbal cues for proper technique and sequence. Pt donning and doffing lumbar corset while seated on EOB and with min verbal cues for technique and placement. Pt ambulating short distance of 15 ' into bathroom with steady assist and use of RW for toilet transfers. Pt requiring lifting assistance on and off toilet with pt use of grab bars as well. Pt standing at sink with steady assist while washing hand. OT educating pt on energy conservation techniques throughout session in regards to self care. Pt returning to bed at end of session with bed alarm activated and call bell with all needed items within reach upon exiting the room.   Therapy Documentation Precautions:  Precautions Precautions: Back, Fall Precaution Comments: pt able to recall 1/3 back precautions without cuing Required Braces or Orthoses: Spinal Brace Spinal Brace: Lumbar corset Restrictions Weight Bearing Restrictions: No Vital Signs: Therapy  Vitals Temp: 98.4 F (36.9 C) Temp Source: Oral Pulse Rate: (!) 108 Resp: 18 BP: (!) 89/59 mmHg Patient Position (if appropriate): Lying Oxygen Therapy SpO2: 99 % O2 Device: Not Delivered Pain: Pain Assessment Pain Assessment: 0-10 Pain Score: 5  Pain Location: Back Pain Orientation: Lower Pain Intervention(s): Emotional support;Repositioned ADL: ADL ADL Comments: see Functional status  See Function Navigator for Current Functional Status.   Therapy/Group: Individual Therapy  [name] 10/30/2014, 3:41 PM

## 2014-10-30 NOTE — Progress Notes (Signed)
[place] PHYSICAL MEDICINE & REHABILITATION     PROGRESS NOTE    Subjective/Complaints: rn reports drainage from wound. Dressing had to be changed twice.  He denies new pain, fever. breathing a little better  ROS: Pt denies fever, rash/itching, headache, blurred or double vision, nausea, vomiting, abdominal pain, diarrhea, chest pain, shortness of breath, palpitations, dysuria, dizziness, neck pain, bleeding, anxiety, or depression.     Objective: Vital Signs: Blood pressure 107/66, pulse 87, temperature 98.4 F (36.9 C), temperature source Oral, resp. rate 17, height '5\' 6"'$  (1.676 m), weight 55.6 kg (122 lb 9.2 oz), SpO2 95 %. No results found.  Recent Labs  10/29/14 0547  WBC 11.3*  HGB 9.6*  HCT 29.0*  PLT 209    Recent Labs  10/29/14 0547  NA 133*  K 3.4*  CL 102  GLUCOSE 120*  BUN 13  CREATININE 1.09  CALCIUM 8.0*   CBG (last 3)   Recent Labs  10/28/14 0804  GLUCAP 132*    Wt Readings from Last 3 Encounters:  10/28/14 55.6 kg (122 lb 9.2 oz)  10/23/14 57.6 kg (126 lb 15.8 oz)  09/10/14 58.968 kg (130 lb)    Physical Exam:  Constitutional: He is oriented to person, place, and time.  59 year old male appearing much older than stated age, emaciated HENT: dentition poor Head: Normocephalic.  Eyes: EOM are normal.  Neck: Normal range of motion. Neck supple. No thyromegaly present.  Cardiovascular: Normal rate and regular rhythm.  Respiratory: Effort normal. Decreased wheezes. No respiratory distress.  GI: Soft. Bowel sounds are normal. He exhibits no distension.  Neurological: He is alert and oriented to person, place, and time.  UE 4/5 delt,bicep,tricep,wrists,hand;, Bilateral LE: 3+hf. 4-ke and ad/apf. No sensory findings. Limited by back pain. Cognitively was intact.  Skin:  Back incision with s/s drainage in upper 1 inch of wound. No fluctuance, odor, pain with palpation  Assessment/Plan: 1. Functional deficits secondary to pathological  L3 fracture which require 3+ hours per day of interdisciplinary therapy in a comprehensive inpatient rehab setting. Physiatrist is providing close team supervision and 24 hour management of active medical problems listed below. Physiatrist and rehab team continue to assess barriers to discharge/monitor patient progress toward functional and medical goals.  Function:  Bathing Bathing position   Position: Wheelchair/chair at sink  Bathing parts Body parts bathed by patient: Right arm, Left arm, Chest, Abdomen, Front perineal area, Right upper leg, Left upper leg Body parts bathed by helper: Buttocks, Right lower leg, Left lower leg, Back  Bathing assist        Upper Body Dressing/Undressing Upper body dressing   What is the patient wearing?: Orthosis             Orthosis activity level: Performed by helper  Upper body assist Assist Level: Touching or steadying assistance(Pt > 75%)      Lower Body Dressing/Undressing Lower body dressing   What is the patient wearing?: Hospital Gown, Non-skid slipper socks           Non-skid slipper socks- Performed by helper: Don/doff right sock, Don/doff left sock                  Lower body assist Assist Level: Touching or steadying assistance (Pt > 75%)      Toileting Toileting Toileting activity did not occur: N/A Toileting steps completed by patient: Adjust clothing prior to toileting, Adjust clothing after toileting      Toileting assist Assist level: Supervision or verbal  cues   Transfers Chair/bed transfer   Chair/bed transfer method: Ambulatory Chair/bed transfer assist level: Touching or steadying assistance (Pt > 75%) Chair/bed transfer assistive device: Librarian, academic     Max distance: 100 Assist level: Touching or steadying assistance (Pt > 75%)   Wheelchair   Type: Manual Max wheelchair distance: 150 Assist Level: Supervision or verbal cues  Cognition Comprehension Comprehension assist  level: Follows basic conversation/direction with extra time/assistive device  Expression Expression assist level: Expresses basic needs/ideas: With no assist  Social Interaction Social Interaction assist level: Interacts appropriately 90% of the time - Needs monitoring or encouragement for participation or interaction.  Problem Solving Problem solving assist level: Solves basic problems with no assist  Memory Memory assist level: Recognizes or recalls 75 - 89% of the time/requires cueing 10 - 24% of the time    Medical Problem List and Plan: 1. 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   -begin therapies today  -pt needs follow up with [facility] on discharge 2. DVT Prophylaxis/Anticoagulation: SCDs. Check vascular study today given vte risk 3. Pain Management: Oxycodone as needed. Monitor with increased mobility 4. Acute blood loss anemia. Follow-up labs personally reviewed---up to 9.6 hgb 5. Neuropsych: This patient is capable of making decisions on his own behalf. 6. Skin/Wound Care: Routine skin checks. nutrition 7. Fluids/Electrolytes/Nutrition: Routine I&O. Labs reviewed---replace k+ 8. Urinary retention. Continue Flomax. Check PVRs 3. pvr 189 last noc 9. COPD/ Tobacco abuse.    - oxygen  -scheduled nebs have helped  -consider course of oral steroids but he is moving air better today  -comfortable at present 10. Hypertension. No current antihypertensives medication. Patient on Benicar 20-12 0.5 mg daily prior to admission. Monitor with increased mobility 11. Decreased nutritional storage. Continue nutritional supplements and encouarge po. 12. Leukocytosis: wound now with drainage  -check cbc tomorrow  -increase dressing changes---drainage does not appear purulent  -afebrile  LOS (Days) 2 A FACE TO FACE EVALUATION WAS PERFORMED  Adriona Kaney T 10/30/2014 8:34 AM

## 2014-10-30 NOTE — Progress Notes (Signed)
Occupational Therapy Session Note  Patient Details  Name: Edward Duran MRN: [id number] Date of Birth: 12/07/1955  Today's Date: 10/30/2014 OT Individual Time: 0900-1000 OT Individual Time Calculation (min): 60 min    Short Term Goals: Week 1:  OT Short Term Goal 1 (Week 1): Pt will peform toileting with min A (3/3 steps) OT Short Term Goal 2 (Week 1): Pt will perform shower transfer with miin A OT Short Term Goal 3 (Week 1): PT will bathe at shower level with AE and remained seated with min A (due to back precautions) OT Short Term Goal 4 (Week 1): Pt will don LB clothing with AE with min A  Skilled Therapeutic Interventions/Progress Updates:  Upon entering the room, pt supine in bed with no c/o pain. As session progressed, pt reports 4/10 pain in lower back with mobility. Min verbal cues and min A for log roll and proper technique for supine >sit. Pt required assistance to donn lumbar corset while seated on EOB. Pt ambulated with SPC to dresser in order to obtain clothing items for self care task this session. Pt seated at sink side with steady assist for sit <>stand and balance during bathing and balance during LB clothing management. Pt requiring min verbal cues for safety as well such as locking wheelchair brakes before standing. Pt requiring multiple rest breaks secondary to fatigue and O2 dropping to 87% with standing and taking pt ~ 1 minute to return to 90%+ on room air. OT demonstrating and encouraging pursed lip breathing. Pt seated in wheelchair with call bell and all needed items within reach upon exiting the room.   Therapy Documentation Precautions:  Precautions Precautions: Back, Fall Precaution Comments: pt able to recall 1/3 back precautions without cuing Required Braces or Orthoses: Spinal Brace Spinal Brace: Lumbar corset Restrictions Weight Bearing Restrictions: No Vital Signs: Therapy Vitals Pulse Rate: 99 Resp: 18 Patient Position (if appropriate):  Sitting Oxygen Therapy SpO2: 99 % O2 Device: Not Delivered Pain: Pain Assessment Pain Assessment: 0-10 Pain Score: 4  Pain Location: Back Pain Intervention(s): Emotional support ADL: ADL ADL Comments: see Functional status  See Function Navigator for Current Functional Status.   Therapy/Group: Individual Therapy  [name] 10/30/2014, 12:45 PM

## 2014-10-30 NOTE — Plan of Care (Signed)
Problem: RH BOWEL ELIMINATION Goal: RH STG MANAGE BOWEL W/MEDICATION W/ASSISTANCE STG Manage Bowel with Medication with Assistance.  Outcome: Progressing Sorbitol given on day shift  Problem: RH BLADDER ELIMINATION Goal: RH STG MANAGE BLADDER WITH ASSISTANCE STG Manage Bladder With Assistance. Mod I  Outcome: Progressing Patient voiding on own after foley d/c'd 9/12; PVRs 0-17m

## 2014-10-30 NOTE — Progress Notes (Signed)
No acute events AVSS Awake and alert 5/5 BLE, some pain limitation proximally Incision c/d/i, reportedly was draining some earlier, but now dry. Stable Continue therapy

## 2014-10-31 ENCOUNTER — Inpatient Hospital Stay (HOSPITAL_COMMUNITY): Payer: Medicaid Other | Admitting: Physical Therapy

## 2014-10-31 ENCOUNTER — Encounter (HOSPITAL_COMMUNITY): Payer: Self-pay

## 2014-10-31 ENCOUNTER — Inpatient Hospital Stay (HOSPITAL_COMMUNITY): Payer: Medicaid Other | Admitting: Occupational Therapy

## 2014-10-31 LAB — CBC
HEMATOCRIT: 27.1 % — AB (ref 39.0–52.0)
HEMOGLOBIN: 9.1 g/dL — AB (ref 13.0–17.0)
MCH: 29.2 pg (ref 26.0–34.0)
MCHC: 33.6 g/dL (ref 30.0–36.0)
MCV: 86.9 fL (ref 78.0–100.0)
Platelets: 256 10*3/uL (ref 150–400)
RBC: 3.12 MIL/uL — AB (ref 4.22–5.81)
RDW: 15.8 % — ABNORMAL HIGH (ref 11.5–15.5)
WBC: 9.1 10*3/uL (ref 4.0–10.5)

## 2014-10-31 MED ORDER — ALPRAZOLAM 0.25 MG PO TABS
0.5000 mg | ORAL_TABLET | Freq: Every day | ORAL | Status: DC
  Administered 2014-10-31 – 2014-11-05 (×6): 0.5 mg via ORAL
  Filled 2014-10-31 (×3): qty 2
  Filled 2014-10-31: qty 1
  Filled 2014-10-31: qty 2
  Filled 2014-10-31: qty 1

## 2014-10-31 MED ORDER — TRAZODONE HCL 50 MG PO TABS
50.0000 mg | ORAL_TABLET | Freq: Every evening | ORAL | Status: DC | PRN
  Filled 2014-10-31: qty 1

## 2014-10-31 MED ORDER — ALPRAZOLAM 0.25 MG PO TABS
0.2500 mg | ORAL_TABLET | Freq: Three times a day (TID) | ORAL | Status: DC | PRN
  Administered 2014-11-05: 0.25 mg via ORAL
  Filled 2014-10-31 (×2): qty 1

## 2014-10-31 NOTE — Progress Notes (Signed)
[place] PHYSICAL MEDICINE & REHABILITATION     PROGRESS NOTE    Subjective/Complaints: rn reports drainage from wound. Dressing had to be changed twice.  He denies new pain, fever. breathing a little better  ROS: Pt denies fever, rash/itching, headache, blurred or double vision, nausea, vomiting, abdominal pain, diarrhea, chest pain, shortness of breath, palpitations, dysuria, dizziness, neck pain, bleeding, anxiety, or depression.     Objective: Vital Signs: Blood pressure 137/42, pulse 88, temperature 98 F (36.7 C), temperature source Oral, resp. rate 20, height '5\' 6"'$  (1.676 m), weight 56.1 kg (123 lb 10.9 oz), SpO2 96 %. No results found.  Recent Labs  10/29/14 0547 10/31/14 0543  WBC 11.3* 9.1  HGB 9.6* 9.1*  HCT 29.0* 27.1*  PLT 209 256    Recent Labs  10/29/14 0547  NA 133*  K 3.4*  CL 102  GLUCOSE 120*  BUN 13  CREATININE 1.09  CALCIUM 8.0*   CBG (last 3)  No results for input(s): GLUCAP in the last 72 hours.  Wt Readings from Last 3 Encounters:  10/30/14 56.1 kg (123 lb 10.9 oz)  10/23/14 57.6 kg (126 lb 15.8 oz)  09/10/14 58.968 kg (130 lb)    Physical Exam:  Constitutional: He is oriented to person, place, and time.  59 year old male appearing much older than stated age, emaciated HENT: dentition poor Head: Normocephalic.  Eyes: EOM are normal.  Neck: Normal range of motion. Neck supple. No thyromegaly present.  Cardiovascular: Normal rate and regular rhythm.  Respiratory: Effort normal. Decreased wheezes. No respiratory distress.  GI: Soft. Bowel sounds are normal. He exhibits no distension.  Neurological: He is alert and oriented to person, place, and time.  UE 4/5 delt,bicep,tricep,wrists,hand;, Bilateral LE: 3+hf. 4-ke and ad/apf. No sensory findings. Limited by back pain. Cognitively was intact.  Skin:  Back incision with s/s drainage in upper 1 inch of wound. No fluctuance, odor, pain with palpation  Assessment/Plan: 1.  Functional deficits secondary to pathological L3 fracture which require 3+ hours per day of interdisciplinary therapy in a comprehensive inpatient rehab setting. Physiatrist is providing close team supervision and 24 hour management of active medical problems listed below. Physiatrist and rehab team continue to assess barriers to discharge/monitor patient progress toward functional and medical goals.  Function:  Bathing Bathing position   Position: Wheelchair/chair at sink  Bathing parts Body parts bathed by patient: Right arm, Left arm, Chest, Abdomen, Front perineal area, Right upper leg, Left upper leg, Buttocks Body parts bathed by helper: Right lower leg, Left lower leg, Back  Bathing assist Assist Level: Touching or steadying assistance(Pt > 75%)      Upper Body Dressing/Undressing Upper body dressing   What is the patient wearing?: Orthosis, Pull over shirt/dress     Pull over shirt/dress - Perfomed by patient: Thread/unthread right sleeve, Thread/unthread left sleeve, Put head through opening, Pull shirt over trunk       Orthosis activity level: Performed by helper  Upper body assist Assist Level: Touching or steadying assistance(Pt > 75%)      Lower Body Dressing/Undressing Lower body dressing   What is the patient wearing?: Non-skid slipper socks, Underwear, Pants Underwear - Performed by patient: Thread/unthread right underwear leg, Thread/unthread left underwear leg, Pull underwear up/down   Pants- Performed by patient: Thread/unthread right pants leg, Thread/unthread left pants leg Pants- Performed by helper: Pull pants up/down, Fasten/unfasten pants Non-skid slipper socks- Performed by patient: Don/doff right sock, Don/doff left sock Non-skid slipper socks- Performed by helper:  Don/doff right sock, Don/doff left sock                  Lower body assist Assist Level: Touching or steadying assistance (Pt > 75%)      Toileting Toileting Toileting activity did  not occur: N/A Toileting steps completed by patient: Adjust clothing prior to toileting, Adjust clothing after toileting      Toileting assist Assist level: Touching or steadying assistance (Pt.75%)   Transfers Chair/bed transfer   Chair/bed transfer method: Ambulatory Chair/bed transfer assist level: Supervision or verbal cues Chair/bed transfer assistive device: Medical sales representative     Max distance: 175 Assist level: Touching or steadying assistance (Pt > 75%)   Wheelchair   Type: Manual Max wheelchair distance: >300 Assist Level: No help, No cues, assistive device, takes more than reasonable amount of time  Cognition Comprehension Comprehension assist level: Follows basic conversation/direction with extra time/assistive device  Expression Expression assist level: Expresses basic needs/ideas: With no assist  Social Interaction Social Interaction assist level: Interacts appropriately 90% of the time - Needs monitoring or encouragement for participation or interaction.  Problem Solving Problem solving assist level: Solves basic problems with no assist  Memory Memory assist level: Recognizes or recalls 75 - 89% of the time/requires cueing 10 - 24% of the time    Medical Problem List and Plan: 1. 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   -continues therapies  -pt needs follow up with [facility] on discharge 2. DVT Prophylaxis/Anticoagulation: SCDs. Check vascular study today given vte risk 3. Pain Management: Oxycodone as needed. Monitor with increased mobility 4. Acute blood loss anemia. Follow-up labs personally reviewed---up to 9.6 hgb 5. Neuropsych: This patient is capable of making decisions on his own behalf.  -appears anxious and frustrated about being here, about dx, etc. Some depression  -will add xanax scheduled to help with sleep/anxiety 6. Skin/Wound Care: Routine skin checks.  nutrition 7. Fluids/Electrolytes/Nutrition: Routine I&O. Labs reviewed---replace k+ 8. Urinary retention. Continue Flomax.   9. COPD/ Tobacco abuse.    - oxygen  -scheduled nebs have helped open him up  -consider course of oral steroids if he flares up   10. Hypertension. No current antihypertensives medication. Patient on Benicar 20-12 0.5 mg daily prior to admission.   11. Decreased nutritional storage. Continue nutritional supplements and encouarge po. 12. Leukocytosis: wound with increased drainage over 24 hour period--seems to have decreased though overnight  -wbc's 9.1, hgb generally stable---recheck monday  -daily dressing changes as needed  -afebrile  LOS (Days) 3 A FACE TO FACE EVALUATION WAS PERFORMED  SWARTZ,ZACHARY T 10/31/2014 8:22 AM

## 2014-10-31 NOTE — Progress Notes (Signed)
No acute events AVSS Awake and alert Motor 5/5 BLE Sensation intact Incision c/d/i, some serosanguenous drainage on dressing but none actively draining from incision Stable Continue therapy

## 2014-10-31 NOTE — Progress Notes (Signed)
Physical Therapy Session Note  Patient Details  Name: Edward Duran MRN: [id number] Date of Birth: 11/22/1955  Today's Date: 10/31/2014 PT Individual Time: 1515-1615 PT Individual Time Calculation (min): 60 min   Short Term Goals: Week 1:  PT Short Term Goal 1 (Week 1): Pt will demonstrate bed mobility with supervision. PT Short Term Goal 2 (Week 1): Pt will transfer with supervision and LRAD.  PT Short Term Goal 3 (Week 1): Pt will amb 150' with LRAD and supervision. PT Short Term Goal 4 (Week 1): Pt will negotiate 8 steps with supervision.  Skilled Therapeutic Interventions/Progress Updates:    Session 1: Pt received supine in bed and agreeable to therapy. PT provided min verbal cues for log roll and pt performed supine>sit, donned brace, and sit>stand with RW all with supervision.  Pt amb to therapy gym with RW and supervision.  PT instructed patient in BLE therex 2x15 reps with 1# ankle weights for heel/toe raises, LAQ, hip flexion, hip abd with tangerine theraband, and ball squeezes.  Pt reports pain steady at 3/10 following exercise.  PT instructed patient in furniture transfers in simulation apartment with min A to rise from couch and verbal cues for log roll when practicing bed mobility.  Pt ambulated back to room with RW and supervision and performed toileting with supervision for safety and verbal cues for not twisting when turning away from toilet.  Pt positioned in recliner at end of session with RN present.  Call bell in reach and needs met.    Session 2: Pt received supine in bed and agreeable to therapy. PT provided min verbal cues for log roll and pt transitioned from supine>sit>stand with RW and amb to therapy gym with supervision.  PT engaged pt in dynamic standing balance and cognitive activity playing connect 4 at chest height with no UE support.  PT instructed patient in ambulation on uneven surfaces (floor mat) with RW and close supervision for safety with verbal cues for RW  safety.  PT instructed patient in repeated sit<>stand for LE strengthening and activity tolerance, pt performed sit<>stand x5 to place horseshoes on basketball goal and sit<>stand x5 to retrieve horseshoes from basketball goal.  Pt amb back to room at end of session and positioned in bed (min cues for back precautions) and left with call bell in reach and needs met.    Therapy Documentation Precautions:  Precautions Precautions: Back, Fall Precaution Comments: pt able to recall 1/3 back precautions without cuing Required Braces or Orthoses: Spinal Brace Spinal Brace: Lumbar corset Restrictions Weight Bearing Restrictions: No  Pain: Pain Assessment Pain Assessment: 0-10 Pain Score: 3  Pain Type: Acute pain Pain Location: Back Pain Orientation: Lower Pain Descriptors / Indicators: Discomfort;Aching Pain Frequency: Constant Pain Onset: On-going Patients Stated Pain Goal: 3 Pain Intervention(s): Emotional support   See Function Navigator for Current Functional Status.   Therapy/Group: Individual Therapy   E Penven-Crew 10/31/2014, 9:34 AM  

## 2014-10-31 NOTE — Progress Notes (Signed)
Occupational Therapy Session Note  Patient Details  Name: Edward Duran MRN: [id number] Date of Birth: 09-02-1955  Today's Date: 10/31/2014 OT Individual Time: 0700-0730 and 1000-1055 OT Individual Time Calculation (min): 30 min and 55 min   Short Term Goals: Week 1:  OT Short Term Goal 1 (Week 1): Pt will peform toileting with min A (3/3 steps) OT Short Term Goal 2 (Week 1): Pt will perform shower transfer with miin A OT Short Term Goal 3 (Week 1): PT will bathe at shower level with AE and remained seated with min A (due to back precautions) OT Short Term Goal 4 (Week 1): Pt will don LB clothing with AE with min A  Skilled Therapeutic Interventions/Progress Updates:  Session 1: Upon entering the room, pt supine in bed with 4/10 c/o pain this session in lower back. Pt given medication prior to therapist entering the room but pt does not remember this encounter. Pt is very anxious and upset over not being able to go home at this time. OT educated pt on OT purpose, progress towards goals, and safety concerns with going home currently. Pt performed supine >sit with supervision and min verbal cues for proper technique to maintain precautions. Pt donned lumbar corset independently while seated on EOB. Pt ambulating with supervision - min guard for safety into bathroom. Pt standing while holding onto grab bar for toileting with min A for safety. Continued steady assist for LB clothing management. Pt returning to bed and doffing brace in order to return to bed as he refuses to remain up in chair. Call bell and all needed items within reach upon exiting the room.    Session 2: Upon entering the room, pt seated in recliner chair with RN present in room and providing medication. Pt with 3/10 reported pain in lower back this session. Pt requiring min verbal cues for back precautions this session. Pt ambulating 300' with RW with min guard down hallway and onto elevator. Pt reporting increased fatigue and  exertion and requiring seated rest break. Pt HR was 110 and O2 sat was 92%. OT educated pt on energy conservation techniques for self care, community mobility, and general principles. Pt verbalized understanding and engaged in conversation by providing various examples of when to use such techniques. Pt refused to go into gift shop or outside at this time. Pt then ambulating with RW 200' with min guard and requiring additional rest break with O2 at 98%. After resting, 4 minutes pt able to ambulate the rest of the way into room in same manner as above. Family members arriving to there room as well. Pt able to doff brace and performed sit >supine with min cues for precautions. Call bell and all needed items within reach upon exiting the room.   Therapy Documentation Precautions:  Precautions Precautions: Back, Fall Precaution Comments: pt able to recall 1/3 back precautions without cuing Required Braces or Orthoses: Spinal Brace Spinal Brace: Lumbar corset Restrictions Weight Bearing Restrictions: No Vital Signs: Therapy Vitals Temp: 98 F (36.7 C) Temp Source: Oral Pulse Rate: 88 Resp: 20 BP: (!) 137/42 mmHg Patient Position (if appropriate): Lying Oxygen Therapy SpO2: 96 % O2 Device: Not Delivered Pain: Pain Assessment Pain Assessment: 0-10 Pain Score: 4  Pain Type: Acute pain Pain Location: Back Pain Orientation: Lower Pain Descriptors / Indicators: Discomfort;Aching Pain Frequency: Constant Pain Onset: On-going Patients Stated Pain Goal: 3 Pain Intervention(s): Medication (See eMAR);Repositioned ADL: ADL ADL Comments: see Functional status  See Function Navigator for Current Functional  Status.   Therapy/Group: Individual Therapy    Edward Duran 10/31/2014, 8:00 AM

## 2014-10-31 NOTE — IPOC Note (Signed)
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 Active Problems:   COPD (chronic obstructive pulmonary disease)   Lung mass   Debility     Functional Problem List: Nursing Bladder, Bowel, Safety, Skin Integrity  PT Balance, Endurance, Motor, Pain, Safety  OT Balance, Behavior, Edema, Endurance, Cognition, Motor, Pain, Perception  SLP    TR         Basic ADL's: OT Grooming, Bathing, Dressing, Toileting     Advanced  ADL's: OT       Transfers: PT Bed Mobility, Bed to Chair, Teacher, early years/pre, Tub/Shower     Locomotion: PT Stairs, Emergency planning/management officer, Ambulation     Additional Impairments: OT None  SLP        TR      Anticipated Outcomes Item Anticipated Outcome  Self Feeding n/a  Swallowing      Basic self-care  supervision  Toileting  supervision   Bathroom Transfers supervision  Bowel/Bladder  Continent of bowel and bladder  Transfers  mod I  Locomotion  supervision  Communication     Cognition     Pain  <3  Safety/Judgment  Mod I   Therapy Plan: PT Intensity: Minimum of 1-2 x/day ,45 to 90 minutes PT Frequency: 5 out of 7 days PT Duration Estimated Length of Stay: 10-12 OT Intensity: Minimum of 1-2 x/day, 45 to 90 minutes OT Frequency: 5 out of 7 days OT Duration/Estimated Length of Stay: 7-10 days         Team Interventions: Nursing Interventions Patient/Family Education, Disease Management/Prevention, Skin Care/Wound Management  PT interventions Ambulation/gait training, Training and development officer, Discharge planning, Neuromuscular re-education, Community reintegration, Functional mobility training, DME/adaptive equipment instruction, Patient/family education, Psychosocial support, IT trainer, Therapeutic Activities, Therapeutic Exercise, UE/LE  Strength taining/ROM, UE/LE Coordination activities, Wheelchair propulsion/positioning  OT Interventions Cognitive remediation/compensation, Training and development officer, Academic librarian, Discharge planning, Functional mobility training, Neuromuscular re-education, Patient/family education, Self Care/advanced ADL retraining, Therapeutic Exercise, UE/LE Coordination activities, UE/LE Strength taining/ROM, Therapeutic Activities, Psychosocial support, Pain management  SLP Interventions    TR Interventions    SW/CM Interventions Discharge Planning, Psychosocial Support, Patient/Family Education    Team Discharge Planning: Destination: PT-Home ,OT- Home , SLP-  Projected Follow-up: PT-Home health PT, 24 hour supervision/assistance, OT-  Home health OT, SLP-  Projected Equipment Needs: PT-To be determined, OT- 3 in 1 bedside comode, SLP-  Equipment Details: PT-may need w/c for community distances, TBD, OT-  Patient/family involved in discharge planning: PT- Patient,  OT-Patient, SLP-   MD ELOS: 7-10d Medical Rehab Prognosis:  good Assessment: 59 y.o. right handed male with history of hypertension, COPD, tobacco abuse. Patient lives with his 15 year old mother independent with a single-point cane sometimes requiring walker prior to admission. Presented 10/21/2014 with progressive low back pain 2 months and right lower extremity weakness as well as a 40 pound weight loss over the past few months. He denied bowel or bladder incontinence. X-ray/MRI and imaging revealed pathologic fracture of L3 with tumor invasion extending into the posterior elements and transverse process on the right. 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 In addition a nonenhancing lesion at L2 and T10 was seen suspicious for metastatic disease. An incidental right lower lobe lesion posteriorly measuring 2 x 2.5 cm with right hilar adenopathy noted. CT abdomen and pelvis  remarkable for  noted findings as well as complete collapse of the visualized right middle lobe likely related to central obstruction by the known lung mass. CT of the head negative for intracranial malignancy.. Underwent laminectomy inferior L2, L3 superior L4 for decompression and tumor resection, L3 corpectomy with anterior fusion L2-L4, internal fixation with pedicle screws at L1, L2, L4, L5. 10/23/2014 per Dr. Cyndy Freeze    Now requiring 24/7 Rehab RN,MD, as well as CIR level PT, OT . Treatment team will focus on ADLs and mobility with goals set at Sup/Mod I   See Team Conference Notes for weekly updates to the plan of care

## 2014-11-01 ENCOUNTER — Inpatient Hospital Stay (HOSPITAL_COMMUNITY): Payer: Medicaid Other | Admitting: Occupational Therapy

## 2014-11-01 ENCOUNTER — Inpatient Hospital Stay (HOSPITAL_COMMUNITY): Payer: Medicaid Other | Admitting: Physical Therapy

## 2014-11-01 ENCOUNTER — Inpatient Hospital Stay (HOSPITAL_COMMUNITY): Payer: Self-pay | Admitting: Occupational Therapy

## 2014-11-01 ENCOUNTER — Encounter (HOSPITAL_COMMUNITY): Payer: Self-pay

## 2014-11-01 NOTE — Progress Notes (Signed)
[facility]     PROGRESS NOTE    Subjective/Complaints: Had a better night. Felt that pain kept him up somewhat. Things improved with therapy yesterday as day progressed. Feeling better today about things as a whole  ROS: Pt denies fever, rash/itching, headache, blurred or double vision, nausea, vomiting, abdominal pain, diarrhea, chest pain, shortness of breath, palpitations, dysuria, dizziness, neck pain, bleeding, anxiety, or depression.     Objective: Vital Signs: Blood pressure 144/61, pulse 89, temperature 98.4 F (36.9 C), temperature source Oral, resp. rate 19, height '5\' 6"'$  (1.676 m), weight 56.1 kg (123 lb 10.9 oz), SpO2 96 %. No results found.  Recent Labs  10/31/14 0543  WBC 9.1  HGB 9.1*  HCT 27.1*  PLT 256   No results for input(s): NA, K, CL, GLUCOSE, BUN, CREATININE, CALCIUM in the last 72 hours.  Invalid input(s): CO CBG (last 3)  No results for input(s): GLUCAP in the last 72 hours.  Wt Readings from Last 3 Encounters:  10/30/14 56.1 kg (123 lb 10.9 oz)  10/23/14 57.6 kg (126 lb 15.8 oz)  09/10/14 58.968 kg (130 lb)    Physical Exam:  Constitutional: He is oriented to person, place, and time.  59 year old male appearing much older than stated age, emaciated HENT: dentition poor Head: Normocephalic.  Eyes: EOM are normal.  Neck: Normal range of motion. Neck supple. No thyromegaly present.  Cardiovascular: Normal rate and regular rhythm.  Respiratory: Effort normal. Decreased wheezes. No respiratory distress.  GI: Soft. Bowel sounds are normal. He exhibits no distension.  Neurological: He is alert and oriented to person, place, and time.  UE 4/5 delt,bicep,tricep,wrists,hand;, Bilateral LE: 3+hf. 4-ke and ad/apf. No sensory findings. Limited by back pain. Cognitively was intact.  Skin:  Back incision with decreased s/s drainage-- no odor, pain with palpation Psych: affect flat  Assessment/Plan: 1.  Functional deficits secondary to pathological L3 fracture which require 3+ hours per day of interdisciplinary therapy in a comprehensive inpatient rehab setting. Physiatrist is providing close team supervision and 24 hour management of active medical problems listed below. Physiatrist and rehab team continue to assess barriers to discharge/monitor patient progress toward functional and medical goals.  Function:  Bathing Bathing position   Position: Wheelchair/chair at sink  Bathing parts Body parts bathed by patient: Right arm, Left arm, Chest, Abdomen, Front perineal area, Right upper leg, Left upper leg, Buttocks Body parts bathed by helper: Right lower leg, Left lower leg, Back  Bathing assist Assist Level: Touching or steadying assistance(Pt > 75%)      Upper Body Dressing/Undressing Upper body dressing   What is the patient wearing?: Orthosis, Pull over shirt/dress     Pull over shirt/dress - Perfomed by patient: Thread/unthread right sleeve, Thread/unthread left sleeve, Put head through opening, Pull shirt over trunk       Orthosis activity level: Performed by helper  Upper body assist Assist Level: Touching or steadying assistance(Pt > 75%)      Lower Body Dressing/Undressing Lower body dressing   What is the patient wearing?: Non-skid slipper socks, Underwear, Pants Underwear - Performed by patient: Thread/unthread right underwear leg, Thread/unthread left underwear leg, Pull underwear up/down   Pants- Performed by patient: Thread/unthread right pants leg, Thread/unthread left pants leg Pants- Performed by helper: Pull pants up/down, Fasten/unfasten pants Non-skid slipper socks- Performed by patient: Don/doff right sock, Don/doff left sock Non-skid slipper socks- Performed by helper: Don/doff right sock, Don/doff left sock  Lower body assist Assist Level: Touching or steadying assistance (Pt > 75%)      Toileting Toileting Toileting activity did  not occur: N/A Toileting steps completed by patient: Adjust clothing prior to toileting, Adjust clothing after toileting      Toileting assist Assist level: Supervision or verbal cues   Transfers Chair/bed transfer   Chair/bed transfer method: Ambulatory Chair/bed transfer assist level: Supervision or verbal cues Chair/bed transfer assistive device: Medical sales representative     Max distance: 175 Assist level: Supervision or verbal cues   Wheelchair   Type: Manual Max wheelchair distance: >300 Assist Level: No help, No cues, assistive device, takes more than reasonable amount of time  Cognition Comprehension Comprehension assist level: Follows basic conversation/direction with extra time/assistive device  Expression Expression assist level: Expresses basic needs/ideas: With no assist  Social Interaction Social Interaction assist level: Interacts appropriately 90% of the time - Needs monitoring or encouragement for participation or interaction.  Problem Solving Problem solving assist level: Solves basic problems with no assist  Memory Memory assist level: Recognizes or recalls 75 - 89% of the time/requires cueing 10 - 24% of the time    Medical Problem List and Plan: 1. 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   -continues therapies  -pt needs follow up with Dr. [facility] on discharge 2. DVT Prophylaxis/Anticoagulation: SCDs. Check vascular study today given vte risk 3. Pain Management: Oxycodone as needed. Monitor with increased mobility 4. Acute blood loss anemia. Follow-up labs personally reviewed---up to 9.6 hgb 5. Neuropsych: This patient is capable of making decisions on his own behalf.  -appears anxious and frustrated about being here, about dx, etc. Some depression  -added PRN xanax as well as scheduled QHS to help with sleep/anxiety  -encouraged him to use his oxycodone if he needs it at night to  assist with sleep 6. Skin/Wound Care: Routine skin checks. nutrition 7. Fluids/Electrolytes/Nutrition: Routine I&O. Labs reviewed---replace k+ 8. Urinary retention. Continue Flomax.   9. COPD/ Tobacco abuse.    - oxygen  -scheduled nebs have helped   -consider course of oral steroids if he flares     10. Hypertension. No current antihypertensives medication. Patient on Benicar 20-12 0.5 mg daily prior to admission.   11. Decreased nutritional storage. Continue nutritional supplements and encouarge po. 12. Leukocytosis: wound with increased drainage over 24 hour period--seems to have decreased though overnight  -wbc's 9.1, hgb generally stable---recheck monday  -daily dressing changes as needed  -afebrile  LOS (Days) 4 A FACE TO FACE EVALUATION WAS PERFORMED  SWARTZ,ZACHARY T 11/01/2014 7:48 AM

## 2014-11-01 NOTE — Progress Notes (Signed)
Physical Therapy Session Note  Patient Details  Name: Edward Duran MRN: [id number] Date of Birth: 1955/03/24  Today's Date: 11/01/2014 PT Individual Time: 1751-0258 and 1345-1445 and 1515-1545 PT Individual Time Calculation (min): 27 min and 60 min and 30 min  Short Term Goals: Week 1:  PT Short Term Goal 1 (Week 1): Pt will demonstrate bed mobility with supervision. PT Short Term Goal 2 (Week 1): Pt will transfer with supervision and LRAD.  PT Short Term Goal 3 (Week 1): Pt will amb 150' with LRAD and supervision. PT Short Term Goal 4 (Week 1): Pt will negotiate 8 steps with supervision.  Skilled Therapeutic Interventions/Progress Updates:    Session 1: Pt in bathroom, unaccompanied and without his lumbar corset on, when PT arrived, having gotten up without calling for help.  PT provided supervision as pt amb back to room with RW and PT provided education on safety plan and importance of calling for assistance before getting up due to fall risk.  PT also educated pt on importance of having lumbar corset on whenever out of bed, pt reports "it's impossible to go to the bathroom with that thing on."  Pt very agitated this morning, but agreeable to gait training with PT after redirection and explanation of PT goals and POC.  Pt ambulated >200 feet with RW and mod I from room, through day room, to elevators where he took a seated rest break and then ambulated back to room and positioned in w/c with call bell in reach and needs met.  PT educated patient on importance of calling for help to get back and bed and pt verbalized understanding.    Session 2: Pt received in recliner and agreeable to therapy.  Pt requests ambulation with SPC this session. Pt amb x100' and x70' with [facility] and supervision  from room to therapy gym with one seated rest break.  PT instructs patient in BLE therex 2x10 with 2# weights for heel/toe raises, LAQ, hip flexion, hip abd with tangerine theraband, ball squeezes, standing  minisquats, calf raises, standing hip abduction, and standing hamstring curls. Pt returned to room total A in w/c for energy conservation and RN notified of pt's desire for pain medicine. Pt positioned in w/c with call bell in reach and needs met.    Session 3: Pt received in w/c and agreeable to therapy, requesting to amb with RW this session.  Pt amb x170' with RW to therapy gym and mod I.  PT instructed patient in stair negotiation x12 steps with 2 hand rails, step to and reciprocal step pattern.  Pt completed stairs with supervision.  Pt ambulated back to room and positioned in recliner with call bell in reach and needs met.    Therapy Documentation Precautions:  Precautions Precautions: Back, Fall Precaution Comments: pt able to recall 1/3 back precautions without cuing Required Braces or Orthoses: Spinal Brace Spinal Brace: Lumbar corset Restrictions Weight Bearing Restrictions: No  Pain: Pain Assessment Pain Assessment: Faces   See Function Navigator for Current Functional Status.   Therapy/Group: Individual Therapy  Earnest Conroy Penven-Crew 11/01/2014, 12:03 PM

## 2014-11-01 NOTE — Progress Notes (Signed)
No acute events AVSS Awake and alert Motor 5/5 Bandage c/d/i Stable Continue therapy Will sign off for now

## 2014-11-01 NOTE — Progress Notes (Signed)
Social Work Patient ID: Edward Duran, male   DOB: 30-Apr-1955, 59 y.o.   MRN: [id number]   CSW met with pt on 10-30-14 and later talked with son via telephone to update them on team conference discussion.  They were pleased with d/c date of 11-07-14 and feel family can provide 24/7 supervision.  Told the son that breathing treatments were scheduled rather than waiting for pt to ask for them PRN and son thought this would be better, as pt will most likely not ask for them.  Pt will not be able to get therapies due to no insurance.  CSW to inform therapists.  CSW will continue to follow and assist as needed.

## 2014-11-01 NOTE — Progress Notes (Signed)
Occupational Therapy Session Note  Patient Details  Name: Edward Duran MRN: [id number] Date of Birth: [birth year]  Today's Date: 11/01/2014 OT Individual Time: 0830-0900 OT Individual Time Calculation (min): 30 min  and Today's Date: 11/01/2014 OT Missed Time: 15 Minutes Missed Time Reason: Pain;Patient unwilling/refused to participate without medical reason   Short Term Goals: Week 1:  OT Short Term Goal 1 (Week 1): Pt will peform toileting with min A (3/3 steps) OT Short Term Goal 2 (Week 1): Pt will perform shower transfer with miin A OT Short Term Goal 3 (Week 1): PT will bathe at shower level with AE and remained seated with min A (due to back precautions) OT Short Term Goal 4 (Week 1): Pt will don LB clothing with AE with min A  Skilled Therapeutic Interventions/Progress Updates:  Upon entering the room, pt supine in bed with RN present in room giving medications. Pt with 6/10 c/o pain this session and requiring maximum encouragement for participation. Pt performed supine >sit with min verbal cues for proper technique in order to maintain back precautions. Once seated on EOB , pt donned lumbar corset with set up A. Pt refused self care tasks and OOB activities this session. Pt reporting he did not sleep well secondary to bed alarm going off multiple times. OT educated pt on safety in room and not getting up from bed without assistance. Respiratory therapy also placed pt on breathing treatment while seated on EOB. Pt performing 5 reps of sit <>stand from EOB with min verbal cues for proper technique. Pt with increased fatigue and requiring rest break. Pt refusing additional therapy this session. He transferred from bed into wheelchair with supervision. Call bell and all needed items within reach.     Therapy Documentation Precautions:  Precautions Precautions: Back, Fall Precaution Comments: pt able to recall 1/3 back precautions without cuing Required Braces or Orthoses: Spinal  Brace Spinal Brace: Lumbar corset Restrictions Weight Bearing Restrictions: No General: General OT Amount of Missed Time: 15 Minutes Vital Signs: Therapy Vitals Pulse Rate: (!) 101 Resp: (!) 22 Patient Position (if appropriate): Sitting Oxygen Therapy SpO2: 97 % O2 Device: Not Delivered ADL: ADL ADL Comments: see Functional status  See Function Navigator for Current Functional Status.   Therapy/Group: Individual Therapy  [name] 11/01/2014, 9:29 AM

## 2014-11-01 NOTE — Progress Notes (Signed)
Social Work Assessment and Plan  Patient Details  Name: Edward Duran MRN: [id number] Date of Birth: 06-Aug-1955  Today's Date: 11/01/2014  Problem List:  Patient Active Problem List   Diagnosis Date Noted  . Lung mass 10/28/2014  . Debility 10/28/2014  . Metastatic cancer to spine 10/23/2014  . Shock circulatory 10/23/2014  . Respiratory failure, post-operative 10/23/2014  . Hypomagnesemia 10/23/2014  . HTN (hypertension) 10/21/2014  . 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 Hilar mass 10/21/2014  . COPD (chronic obstructive pulmonary disease) 10/21/2014  . Leg weakness 10/21/2014  . AKI (acute kidney injury) 10/21/2014  . Hyperglycemia 10/21/2014  . Pathological fracture 10/21/2014  . Carotid artery disease    Past Medical History:  Past Medical History  Diagnosis Date  . COPD (chronic obstructive pulmonary disease)   . Hypertension   . Stroke   . Cancer   . Carotid artery disease    Past Surgical History: No past surgical history on file. Social History:  reports that he has been smoking.  He does not have any smokeless tobacco history on file. He reports that he uses illicit drugs. He reports that he does not drink alcohol.  Family / Support Systems Marital Status: Divorced How Long?: 15 years ago - second divorce Patient Roles: Parent, Other (Comment) (son) Children: Mishawn Hemann - son - [phone] Other Supports: Joslyn Hy - mother - [phone] Anticipated Caregiver:  pt's Mom, Joslyn Hy Ability/Limitations of Caregiver: pt. 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 Charm Barges assessment.   Abuse/Neglect Physical Abuse: Denies Verbal Abuse: Denies Sexual Abuse: Denies Exploitation of patient/patient's resources: Denies Self-Neglect: Denies  Emotional Status Pt's affect, behavior and adjustment status: Pt was tired during our visit, but he reports coping with things pretty well, overall, and just takes things as they come. Recent Psychosocial Issues: Pt describes having what seems to be situational depression at different times throughout his life.  He definitely has financial stress which gets him "down", but he seems to take life's curves in stride as "part of life" and continues on. Psychiatric History: none reported Substance Abuse History: none reported  Patient / Family Perceptions, Expectations & Goals Pt/Family understanding of illness & functional limitations: Pt/family have a good understanding of pt's conditon and limitations and he needs going forward. Premorbid pt/family roles/activities: Pt was not able to do much at home.  When he feels well, he would go to watch his grandson's games and work on projects around the house.  Pt used to do car painting/detailing and misses that, as it's all mostly become computer generated. Anticipated changes in roles/activities/participation: Pt has not been able to do all the things he normally does for months now, so he has already adapted to that.  He hopes to get stronger and resume his activities as he is able. Pt/family expectations/goals: Pt would like to be stronger and able to do more for himself/regain some of his independence and not be so tired.  Community Resources [employer]: Other (Comment) (Pt has food stamps from DSS and goes  to the [facility] for medical care.) Premorbid Home Care/DME Agencies: Other  (Comment) (Pt had HH in the past after his stroke, but he cannot remember the agency.  Pt also has a single point cane and rolling walker at home.) Transportation available at discharge: family Resource referrals recommended: Neuropsychology, Psychology, Support group (specify)  Discharge Planning Living Arrangements: Parent, Other relatives (aunt) Insurance Resources: Self-pay (has applied for [employer]) Financial Screen Referred: Previously completed Living Expenses: Lives with family Money Management: Patient, Family Does the patient have any problems obtaining your medications?: Yes (Describe) (Pt currently has no insurance.) Home Management: Pt's family does this. Patient/Family Preliminary Plans: Pt plans to return to his home where his mother, aunt, and son/other family will provide 24/7 supervision. Barriers to Discharge: Steps Social Work Anticipated Follow Up Needs: HH/OP Expected length of stay: 10-12 days  Clinical Impression CSW met with pt and later his son on 10-29-14 to introduce self and role of CSW, as well as to complete assessment.  Pt is very tired and fatigues easily with therapy.  He was willing to meet with CSW, but it did seem to tire him.  Pt's son is very supportive and visits pt regularly.  Pt lives with his mother and aunt and they are elderly, but are able to provide 24/7 supervision with son's help.  Pt's family is willing to transport him to doctor's appointments.  Pt has already applied for social security and medicaid.  Pt goes to [facility] for medical care, but he cannot remember the Leone Putman's name.  Pt will only qualify for home nursing, not therapies due to only having Medicaid.  CSW will alert therapy team of this.  Pt has a cane and rolling walker at home.  CSW will continue to follow and assist as needed.  Prevatt, Silvestre Mesi 11/01/2014, 11:11 AM

## 2014-11-01 NOTE — Progress Notes (Signed)
Occupational Therapy Session Note  Patient Details  Name: Edward Duran MRN: [id number] Date of Birth: November 09, 1955  Today's Date: 11/01/2014 OT Individual Time: 1100-1130 OT Individual Time Calculation (min): 30 min    Short Term Goals: Week 1:  OT Short Term Goal 1 (Week 1): Pt will peform toileting with min A (3/3 steps) OT Short Term Goal 2 (Week 1): Pt will perform shower transfer with miin A OT Short Term Goal 3 (Week 1): PT will bathe at shower level with AE and remained seated with min A (due to back precautions) OT Short Term Goal 4 (Week 1): Pt will don LB clothing with AE with min A  Skilled Therapeutic Interventions/Progress Updates:    Pt seen for OT therapy session focusing on functional transfers and functional activity tolerance. Pt in supine upon arrival. Voicing desire to complete showering task, however, declined bathing/ dressing task though not able to state why. He was agreeable to walking for therapy session. He ambulated throughout unit using RW with close supervision. In ADL apartment, he completed sit <> stand transfers from low soft surface couch, requiring min A to stand from couch. He completed simulated tub bench transfers with supervision with VCs for technique. Pt voiced thinking his mother had tub bench he could use at d/c, will follow up with pt's primary therapist and team. Pt ambulated back to room and agreeable to sit in recliner. Pt left in recliner at end of session, son entering as therapist exited. Pt educated regarding need for assist and use of call bell.  Pt with decreased functional endurance and wheezing sounding of breathing, requiring multiple seated rest breaks throughout session. Education provided regarding energy conservation and need for rest breaks.  Pt able to recall 2/3 spinal pre-cautions when asked multiple times throughout session demonstrating decreased short term memory and carryover of education.   Therapy Documentation Precautions:   Precautions Precautions: Back, Fall Precaution Comments: pt able to recall 1/3 back precautions without cuing Required Braces or Orthoses: Spinal Brace Spinal Brace: Lumbar corset Restrictions Weight Bearing Restrictions: No Pain:   No/ denies pain ADL: ADL ADL Comments: see Functional status  See Function Navigator for Current Functional Status.   Therapy/Group: Individual Therapy  Lewis, Amy C 11/01/2014, 7:17 AM

## 2014-11-01 NOTE — Patient Care Conference (Signed)
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 Lung mass 10/28/2014  . Debility 10/28/2014  . Metastatic cancer to spine 10/23/2014  . Shock circulatory 10/23/2014  . Respiratory failure, post-operative 10/23/2014  . Hypomagnesemia 10/23/2014  . HTN (hypertension) 10/21/2014  . 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 Hilar mass 10/21/2014  . COPD (chronic obstructive pulmonary disease) 10/21/2014  . Leg weakness 10/21/2014  . AKI (acute kidney injury) 10/21/2014  . Hyperglycemia 10/21/2014  . Pathological fracture 10/21/2014  . Carotid artery disease     Expected Discharge Date: Expected Discharge Date: 11/07/14  Team Members Present: Physician leading conference: Dr. Alger Simons Social Worker Present: Alfonse Alpers, LCSW Nurse Present: Heather Roberts, RN PT Present: Carney Living, PT;Caitlin Penven-Crew, PT OT Present: Willeen Cass, OT SLP Present: Weston Anna, SLP PPS Coordinator present : Ileana Ladd, PT     Current Status/Progress Goal Weekly Team Focus  Medical   likely lung cancer, pathological l3 fracture, bad copd  improve functional mobility  pain control, breathing, education, onc management   Bowel/Bladder   Continent of bowel and bladder. LBM 10/26/14  Pt to remain continent of bowel and  bladder  [place]   Swallow/Nutrition/ Hydration             ADL's   only had hospital gown for eval (total A for LB bathing and dressing), total A for donning orthosis, mod A transfers  overall supervision  activity tolerance, cardiopulmonary endurance, standing tolerance and balance, AE education    Mobility   min A overall  supervision locomotion, mod I transfers  LE strengthening, activity tolerance, balance   Communication             Safety/Cognition/ Behavioral Observations            Pain   No c/o pain  <4  Assess nonverbal cues of pain   Skin   Back incision with dermabond, CDI, OTA, L hemovac opeing to L lateral  with minimum amount of serousangious drainage, scab to L ear healing  No additional skin breakdown  Assess skin q shift    Rehab Goals Patient on target to meet rehab goals: Yes Rehab Goals Revised: none - first conference *See Care Plan and progress notes for long and short-term goals.  Barriers to Discharge: breathing, poor exercise tolerance    Possible Resolutions to Barriers:  pacing. maximize breathing    Discharge Planning/Teaching Needs:  Pt plans to return to his mother's home where she, his aunt, and his son/other family will provide 24/7 supervision.  Pt's son visits daily and can come for family education as needed.   Team Discussion:  Pt lives with elderly mother and aunt, but they can provide 24/7 supervision per son.  Pt is a high fall risk and a little reluctant to do therapies  and cannot tolerate much activity.  MD plans to schedule breathing treatments to see if this helps, rather than leaving them PRN.  Goals were set for supervision.  Revisions to Treatment Plan:  none   Continued Need for Acute Rehabilitation Level of Care: The patient requires daily medical management by a physician with specialized training in physical medicine and rehabilitation for the following conditions: Daily direction of a multidisciplinary physical  rehabilitation program to ensure safe treatment while eliciting the highest outcome that is of practical value to the patient.: Yes Daily medical management of patient stability for increased activity during participation in an intensive rehabilitation regime.: Yes Daily analysis of laboratory values and/or radiology reports with any subsequent need for medication adjustment of medical intervention for : Post surgical problems;Neurological problems;Other  Prevatt, Silvestre Mesi 11/01/2014, 12:26 PM

## 2014-11-02 ENCOUNTER — Inpatient Hospital Stay (HOSPITAL_COMMUNITY): Payer: Medicaid Other | Admitting: Occupational Therapy

## 2014-11-02 ENCOUNTER — Inpatient Hospital Stay (HOSPITAL_COMMUNITY): Payer: Self-pay | Admitting: Occupational Therapy

## 2014-11-02 ENCOUNTER — Inpatient Hospital Stay (HOSPITAL_COMMUNITY): Payer: Medicaid Other | Admitting: Physical Therapy

## 2014-11-02 NOTE — Progress Notes (Signed)
[facility]     PROGRESS NOTE    Subjective/Complaint  No issues overnite.  Pt states pain control is ok  ROS: Pt denies fever, rash/itching, headache, blurred or double vision, nausea, vomiting, abdominal pain, diarrhea, chest pain, shortness of breath, palpitations, dysuria, dizziness, neck pain, bleeding, anxiety, or depression.     Objective: Vital Signs: Blood pressure 131/64, pulse 81, temperature 98.4 F (36.9 C), temperature source Oral, resp. rate 18, height '5\' 6"'$  (1.676 m), weight 56.1 kg (123 lb 10.9 oz), SpO2 99 %. No results found.  Recent Labs  10/31/14 0543  WBC 9.1  HGB 9.1*  HCT 27.1*  PLT 256   No results for input(s): NA, K, CL, GLUCOSE, BUN, CREATININE, CALCIUM in the last 72 hours.  Invalid input(s): CO CBG (last 3)  No results for input(s): GLUCAP in the last 72 hours.  Wt Readings from Last 3 Encounters:  10/30/14 56.1 kg (123 lb 10.9 oz)  10/23/14 57.6 kg (126 lb 15.8 oz)  09/10/14 58.968 kg (130 lb)    Physical Exam:  Constitutional: He is oriented to person, place, and time.  59 year old male appearing much older than stated age, emaciated HENT: dentition poor Head: Normocephalic.  Eyes: EOM are normal.  Neck: Normal range of motion. Neck supple. No thyromegaly present.  Cardiovascular: Normal rate and regular rhythm.  Respiratory: Effort normal. Decreased wheezes. No respiratory distress.  GI: Soft. Bowel sounds are normal. He exhibits no distension.  Neurological: He is alert and oriented to person, place, and time.  UE 4/5 delt,bicep,tricep,wrists,hand;, Bilateral LE: 3+hf. 4-ke and ad/apf. No sensory findings. Limited by back pain. Cognitively was intact.  Skin:  Back incision with  s/s drainage-- no odor, pain with palpation, no induration, just at upper aspect  Psych: affect flat  Assessment/Plan: 1. Functional deficits secondary to pathological L3 fracture which require 3+ hours per day  of interdisciplinary therapy in a comprehensive inpatient rehab setting. Physiatrist is providing close team supervision and 24 hour management of active medical problems listed below. Physiatrist and rehab team continue to assess barriers to discharge/monitor patient progress toward functional and medical goals.  Function:  Bathing Bathing position   Position: Wheelchair/chair at sink  Bathing parts Body parts bathed by patient: Right arm, Left arm, Chest, Abdomen, Front perineal area, Right upper leg, Left upper leg, Buttocks Body parts bathed by helper: Right lower leg, Left lower leg, Back  Bathing assist Assist Level: Touching or steadying assistance(Pt > 75%)      Upper Body Dressing/Undressing Upper body dressing   What is the patient wearing?: Orthosis, Pull over shirt/dress     Pull over shirt/dress - Perfomed by patient: Thread/unthread right sleeve, Thread/unthread left sleeve, Put head through opening, Pull shirt over trunk       Orthosis activity level: Performed by helper  Upper body assist Assist Level: Touching or steadying assistance(Pt > 75%)      Lower Body Dressing/Undressing Lower body dressing   What is the patient wearing?: Non-skid slipper socks, Underwear, Pants Underwear - Performed by patient: Thread/unthread right underwear leg, Thread/unthread left underwear leg, Pull underwear up/down   Pants- Performed by patient: Thread/unthread right pants leg, Thread/unthread left pants leg Pants- Performed by helper: Pull pants up/down, Fasten/unfasten pants Non-skid slipper socks- Performed by patient: Don/doff right sock, Don/doff left sock Non-skid slipper socks- Performed by helper: Don/doff right sock, Don/doff left sock  Lower body assist Assist Level: Touching or steadying assistance (Pt > 75%)      Toileting Toileting Toileting activity did not occur: N/A Toileting steps completed by patient: Adjust clothing prior to  toileting, Adjust clothing after toileting, Performs perineal hygiene   Toileting Assistive Devices: Grab bar or rail  Toileting assist Assist level: Supervision or verbal cues   Transfers Chair/bed transfer   Chair/bed transfer method: Ambulatory Chair/bed transfer assist level: Supervision or verbal cues Chair/bed transfer assistive device: Medical sales representative     Max distance: 200 Assist level: No help, No cues, assistive device, takes more than a reasonable amount of time   Wheelchair   Type: Manual Max wheelchair distance: >300 Assist Level: No help, No cues, assistive device, takes more than reasonable amount of time  Cognition Comprehension Comprehension assist level: Follows basic conversation/direction with extra time/assistive device  Expression Expression assist level: Expresses basic needs/ideas: With extra time/assistive device  Social Interaction Social Interaction assist level: Interacts appropriately 90% of the time - Needs monitoring or encouragement for participation or interaction.  Problem Solving Problem solving assist level: Solves basic problems with no assist  Memory Memory assist level: Recognizes or recalls 75 - 89% of the time/requires cueing 10 - 24% of the time    Medical Problem List and Plan: 1. 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   -continues therapies  -pt needs follow up with [facility] on discharge 2. DVT Prophylaxis/Anticoagulation: SCDs. Check vascular study today given vte risk 3. Pain Management: Oxycodone as needed. Monitor with increased mobility 4. Acute blood loss anemia. Follow-up labs personally reviewed---up to 9.6 hgb 5. Neuropsych: This patient is capable of making decisions on his own behalf.  -appears anxious and frustrated about being here, about dx, etc. Some depression  -added PRN xanax as well as scheduled QHS to help with  sleep/anxiety  -encouraged him to use his oxycodone if he needs it at night to assist with sleep 6. Skin/Wound Care: Routine skin checks. nutrition 7. Fluids/Electrolytes/Nutrition: Routine I&O. Labs reviewed---replace k+ 8. Urinary retention. Continue Flomax.   9. COPD/ Tobacco abuse.    - oxygen  -scheduled nebs have helped   -consider course of oral steroids if he flares     10. Hypertension. No current antihypertensives medication. Patient on Benicar 20-12 0.5 mg daily prior to admission.   11. Decreased nutritional storage. Continue nutritional supplements and encouarge po. 12. Leukocytosis: wound with increased drainage over 24 hour period--seems to have decreased though overnight  -wbc's 9.1, hgb generally stable---recheck monday  -daily dressing changes as needed  -afebrile  LOS (Days) 5 A FACE TO FACE EVALUATION WAS PERFORMED  Jessicaann Overbaugh E 11/02/2014 11:02 AM

## 2014-11-02 NOTE — Progress Notes (Signed)
Occupational Therapy Session Note  Patient Details  Name: Edward Duran MRN: [id number] Date of Birth: [birth year]  Today's Date: 11/02/2014 OT Individual Time: 2811-8867 OT Individual Time Calculation (min): 30 min    Short Term Goals: Week 1:  OT Short Term Goal 1 (Week 1): Pt will peform toileting with min A (3/3 steps) OT Short Term Goal 2 (Week 1): Pt will perform shower transfer with miin A OT Short Term Goal 3 (Week 1): PT will bathe at shower level with AE and remained seated with min A (due to back precautions) OT Short Term Goal 4 (Week 1): Pt will don LB clothing with AE with min A  Skilled Therapeutic Interventions/Progress Updates: Patient participated in skilled OT as follows:   He was able to walk with RW from/to room to toilet with supervision.  He was able to complete toileting, including clothing mgmt with close S, extra time due to c/o fatigue and by holding on grab bars beside the toilet.   He concluded the session by completing seated Upper body exercises to increase strength and endurance.  Patient will benefit from more sessions to increase conditioniong and self care and safe mobility.     Therapy Documentation Precautions:  Precautions Precautions: Back, Fall Precaution Comments: pt able to recall 1/3 back precautions without cuing Required Braces or Orthoses: Spinal Brace Spinal Brace: Lumbar corset Restrictions Weight Bearing Restrictions: No     Pain:denied     See Function Navigator for Current Functional Status.   Therapy/Group: Individual Therapy  Alfredia Ferguson [facility] 11/02/2014, 4:35 PM

## 2014-11-02 NOTE — Progress Notes (Signed)
Physical Therapy Session Note  Patient Details  Name: Edward Duran MRN: [id number] Date of Birth: 12-Feb-1956  Today's Date: 11/02/2014 PT Individual Time: 0800-0900 PT Individual Time Calculation (min): 60 min   Short Term Goals: Week 1:  PT Short Term Goal 1 (Week 1): Pt will demonstrate bed mobility with supervision. PT Short Term Goal 2 (Week 1): Pt will transfer with supervision and LRAD.  PT Short Term Goal 3 (Week 1): Pt will amb 150' with LRAD and supervision. PT Short Term Goal 4 (Week 1): Pt will negotiate 8 steps with supervision.  Skilled Therapeutic Interventions/Progress Updates:  Pt was seen bedside in the am. Pt was able to recall 2/3 back precautions, unable to recall arching. Pt performed all transfers and ambulation with rolling walker with no assist, requiring extra time to complete. Pt ambulated distances of 120 feet x 2, 80 feet x 2 and 200 feet. Pt ascended/descended 12 stairs with 2 rails and S with occasional verbal cues. Pt performed cone taps and alternating cone taps 3 sets x 10 reps each for LE strengthening. Pt also performed 5 sit to stand transfers for LE strengthening. Increased work of breathing noted with activity, monitored vitals pt able to maintain O2 sat greater than 96% even during activity. Pt returned to room following treatment and left sitting up in recliner with call bell within reach.   Therapy Documentation Precautions:  Precautions Precautions: Back, Fall Precaution Comments: pt able to recall 1/3 back precautions without cuing Required Braces or Orthoses: Spinal Brace Spinal Brace: Lumbar corset Restrictions Weight Bearing Restrictions: No General:   Pain: No c/o pain.     See Function Navigator for Current Functional Status.   Therapy/Group: Individual Therapy  Dub Amis 11/02/2014, 10:45 AM

## 2014-11-02 NOTE — Progress Notes (Signed)
11/02/14 1410 nursing Patient transferred to 4MW accompanied by therapist alert and oriented. Oriented to unit set up.  Reminded patient not to get up by himself and  to always call for help. Patient understood.

## 2014-11-02 NOTE — Progress Notes (Signed)
Occupational Therapy Session Note  Patient Details  Name: Edward Duran MRN: [id number] Date of Birth: [birth year]  Today's Date: 11/02/2014 OT Individual Time: 1000-1055 and 1355-1438 OT Individual Time Calculation (min): 55 min and 43 min   Short Term Goals: Week 1:  OT Short Term Goal 1 (Week 1): Pt will peform toileting with min A (3/3 steps) OT Short Term Goal 2 (Week 1): Pt will perform shower transfer with miin A OT Short Term Goal 3 (Week 1): PT will bathe at shower level with AE and remained seated with min A (due to back precautions) OT Short Term Goal 4 (Week 1): Pt will don LB clothing with AE with min A  Skilled Therapeutic Interventions/Progress Updates:  Session 1: Upon entering the room, pt seated in recliner chair awaiting therapist. Pt with 3/10 c/o pain in lower back this session. Pt declined shower again this session. Pt ambulated 200' to ADL apartment with RW and mod I. OT educating pt on back precautions with functional task such as making up bed. Pt made up bed with min verbal cues throughout for squats vs bending back for task. Pt required 2 seated rest breaks to put sheets, comforter, and pillows on full size bed with use of RW and supervision. Pt having no LOB with task.  Pt then ambulated back to room in same manner as stated above. Pt ambulating into bathroom for toileting as well with supervision for safety and min cues to maintain back precautions when flushing toileting. Pt returning to recliner chair with call bell and all needed items within reach upon exiting the room.   Session 2: Upon entering the room, pt seated in recliner chair with 5/10 c/o pain in lower back. OT encouraged pt to use call bell to ask for pain medication. Pt ambulated 2' into day room with RW and mod I. Pt standing for a total of 8 minutes this session during a game of old maid. Pt requiring supervision while standing and neither hand was supported during task. Pt required rest break  secondary to fatigue. He then ambulated 250' to new room on midwest section of hall with use of RW. Call bell and all needed items within reach upon exiting the room.   Therapy Documentation Precautions:  Precautions Precautions: Back, Fall Precaution Comments: pt able to recall 1/3 back precautions without cuing Required Braces or Orthoses: Spinal Brace Spinal Brace: Lumbar corset Restrictions Weight Bearing Restrictions: No ADL: ADL ADL Comments: see Functional status  See Function Navigator for Current Functional Status.   Therapy/Group: Individual Therapy  [name] 11/02/2014, 10:59 AM

## 2014-11-02 NOTE — Plan of Care (Signed)
Problem: RH BOWEL ELIMINATION Goal: RH STG MANAGE BOWEL WITH ASSISTANCE STG Manage Bowel with Assistance. Mod I  Outcome: Not Progressing Pt refuses laxatives. No BM since [phone]

## 2014-11-03 ENCOUNTER — Inpatient Hospital Stay (HOSPITAL_COMMUNITY): Payer: Self-pay | Admitting: Occupational Therapy

## 2014-11-03 NOTE — Progress Notes (Signed)
Occupational Therapy Note  Patient Details  Name: Edward Duran MRN: [id number] Date of Birth: [birth year]  Today's Date: 11/03/2014 OT Missed Time: 76 Minutes Missed Time Reason: Patient unwilling/refused to participate without medical reason  Upon entering pt's room, he refused to participate in OT intervention this session. Pt reporting, "I don't hurt too much but I just feel real bad. I'm not going to do it today. " OT provided maximal encouragement and educated pt on goals but he continued to refuse. Pt remains supine in bed with call bell and all needed items within reach upon exiting the room.    [name] 11/03/2014, 1:42 PM

## 2014-11-03 NOTE — Progress Notes (Signed)
[facility]YSICAL [facility]ICINE & REHABILITATION     PROGRESS NOTE    Subjective/Complaint  No issues overnite.  Pt states pain control is ok, SOB with activity if no O2 uses symbicort at home  ROS: Pt denies fever, rash/itching, headache, blurred or double vision, nausea, vomiting, abdominal pain, diarrhea, chest pain, palpitations, dysuria, dizziness,    Objective: Vital Signs: Blood pressure 98/67, pulse 87, temperature 98.5 F (36.9 C), temperature source Oral, resp. rate 19, height '5\' 6"'$  (1.676 m), weight 56.1 kg (123 lb 10.9 oz), SpO2 95 %. No results found. No results for input(s): WBC, HGB, HCT, PLT in the last 72 hours. No results for input(s): NA, K, CL, GLUCOSE, BUN, CREATININE, CALCIUM in the last 72 hours.  Invalid input(s): CO CBG (last 3)  No results for input(s): GLUCAP in the last 72 hours.  Wt Readings from Last 3 Encounters:  10/30/14 56.1 kg (123 lb 10.9 oz)  10/23/14 57.6 kg (126 lb 15.8 oz)  09/10/14 58.968 kg (130 lb)    Physical Exam:  Constitutional: He is oriented to person, place, and time.  59 year old male appearing much older than stated age, emaciated HENT: dentition poor Head: Normocephalic.  Eyes: EOM are normal.  Neck: Normal range of motion. Neck supple. No thyromegaly present.  Cardiovascular: Normal rate and regular rhythm.  Respiratory: Effort normal. No wheezes. No respiratory distress.  GI: Soft. Bowel sounds are normal. He exhibits no distension.  Neurological: He is alert and oriented to person, place, and time.  UE 4/5 delt,bicep,tricep,wrists,hand;, Bilateral LE: 3+hf. 4-ke and ad/apf. No sensory findings. Limited by back pain. Cognitively was intact.  Skin:  Back incision with  s/s drainage-- no odor, pain with palpation, no induration, just at upper aspect  Psych: affect flat  Assessment/Plan: 1. Functional deficits secondary to pathological L3 fracture which require 3+ hours per day of interdisciplinary therapy in a  comprehensive inpatient rehab setting. Physiatrist is providing close team supervision and 24 hour management of active medical problems listed below. Physiatrist and rehab team continue to assess barriers to discharge/monitor patient progress toward functional and medical goals.  Function:  Bathing Bathing position   Position: Standing at sink  Bathing parts Body parts bathed by patient: Right arm, Left arm, Chest, Abdomen, Front perineal area, Buttocks, Right upper leg, Left upper leg Body parts bathed by helper: Right lower leg, Left lower leg, Back  Bathing assist Assist Level: Touching or steadying assistance(Pt > 75%)      Upper Body Dressing/Undressing Upper body dressing   What is the patient wearing?: Pull over shirt/dress     Pull over shirt/dress - Perfomed by patient: Thread/unthread right sleeve, Thread/unthread left sleeve, Put head through opening, Pull shirt over trunk       Orthosis activity level: Performed by helper  Upper body assist Assist Level: Touching or steadying assistance(Pt > 75%)      Lower Body Dressing/Undressing Lower body dressing   What is the patient wearing?: Non-skid slipper socks, Underwear, Pants Underwear - Performed by patient: Thread/unthread right underwear leg, Thread/unthread left underwear leg, Pull underwear up/down   Pants- Performed by patient: Thread/unthread right pants leg, Thread/unthread left pants leg Pants- Performed by helper: Pull pants up/down, Fasten/unfasten pants Non-skid slipper socks- Performed by patient: Don/doff right sock, Don/doff left sock Non-skid slipper socks- Performed by helper: Don/doff right sock, Don/doff left sock                  Lower body assist Assist Level: Touching  or steadying assistance (Pt > 75%)      Toileting Toileting Toileting activity did not occur: N/A Toileting steps completed by patient: Adjust clothing prior to toileting, Performs perineal hygiene, Adjust clothing after  toileting   Toileting Assistive Devices: Grab bar or rail  Toileting assist Assist level: Supervision or verbal cues   Transfers Chair/bed transfer   Chair/bed transfer method: Ambulatory Chair/bed transfer assist level: Supervision or verbal cues Chair/bed transfer assistive device: Medical sales representative     Max distance: 200 Assist level: No help, No cues, assistive device, takes more than a reasonable amount of time   Wheelchair   Type: Manual Max wheelchair distance: >300 Assist Level: No help, No cues, assistive device, takes more than reasonable amount of time  Cognition Comprehension Comprehension assist level: Follows basic conversation/direction with no assist  Expression Expression assist level: Expresses complex ideas: With extra time/assistive device  Social Interaction Social Interaction assist level: Interacts appropriately 90% of the time - Needs monitoring or encouragement for participation or interaction.  Problem Solving Problem solving assist level: Solves basic problems with no assist  Memory Memory assist level: Recognizes or recalls 75 - 89% of the time/requires cueing 10 - 24% of the time    Medical Problem List and Plan: 1. 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   -continues therapies  -pt needs follow up with Dr. [facility] on discharge 2. DVT Prophylaxis/Anticoagulation: SCDs. LE vascular study no evidence DVT on 9/16 3. Pain Management: Oxycodone as needed. Monitor with increased mobility 4. Acute blood loss anemia. Follow-up labs personally reviewed---up to 9.6 hgb 5. Neuropsych: This patient is capable of making decisions on his own behalf.  -affect is approp today  -added PRN xanax as well as scheduled QHS to help with sleep/anxiety  - 6. Skin/Wound Care: Routine skin checks. nutrition 7. Fluids/Electrolytes/Nutrition: Routine I&O. Labs reviewed---replace k+ 8. Urinary  retention. Continue Flomax.   9. COPD/ Tobacco abuse.    - oxygen  -scheduled nebs have helped   -consider start on symbicort if exacerbation (home med)   10. Hypertension. No current antihypertensives medication. Patient on Benicar 20-12 0.5 mg daily prior to admission.   11. Decreased nutritional storage. Continue nutritional supplements and encouarge po. 12. Leukocytosis: wound with increased drainage over 24 hour period--seems to have decreased though overnight  -wbc's 9.1, hgb generally stable---recheck monday  -daily dressing changes as needed  -afebrile  LOS (Days) 6 A FACE TO FACE EVALUATION WAS PERFORMED  Charlett Blake 11/03/2014 7:31 AM

## 2014-11-04 ENCOUNTER — Inpatient Hospital Stay (HOSPITAL_COMMUNITY): Payer: Self-pay | Admitting: Occupational Therapy

## 2014-11-04 ENCOUNTER — Inpatient Hospital Stay (HOSPITAL_COMMUNITY): Payer: Medicaid Other | Admitting: Physical Therapy

## 2014-11-04 ENCOUNTER — Inpatient Hospital Stay (HOSPITAL_COMMUNITY): Payer: Medicaid Other | Admitting: Occupational Therapy

## 2014-11-04 DIAGNOSIS — F4321 Adjustment disorder with depressed mood: Secondary | ICD-10-CM | POA: Insufficient documentation

## 2014-11-04 LAB — CBC
HEMATOCRIT: 28 % — AB (ref 39.0–52.0)
Hemoglobin: 9.3 g/dL — ABNORMAL LOW (ref 13.0–17.0)
MCH: 29.2 pg (ref 26.0–34.0)
MCHC: 33.2 g/dL (ref 30.0–36.0)
MCV: 87.8 fL (ref 78.0–100.0)
PLATELETS: 366 10*3/uL (ref 150–400)
RBC: 3.19 MIL/uL — ABNORMAL LOW (ref 4.22–5.81)
RDW: 16.1 % — AB (ref 11.5–15.5)
WBC: 8.7 10*3/uL (ref 4.0–10.5)

## 2014-11-04 MED ORDER — BUDESONIDE-FORMOTEROL FUMARATE 160-4.5 MCG/ACT IN AERO
2.0000 | INHALATION_SPRAY | Freq: Two times a day (BID) | RESPIRATORY_TRACT | Status: DC
  Administered 2014-11-04 – 2014-11-06 (×4): 2 via RESPIRATORY_TRACT
  Filled 2014-11-04: qty 6

## 2014-11-04 MED ORDER — IPRATROPIUM-ALBUTEROL 0.5-2.5 (3) MG/3ML IN SOLN: 3.0000 mL | Freq: Four times a day (QID) | RESPIRATORY_TRACT | Status: DC | PRN

## 2014-11-04 NOTE — Progress Notes (Signed)
Occupational Therapy Session Note  Patient Details  Name: Edward Duran MRN: [id number] Date of Birth: [birth year]  Today's Date: 11/04/2014 OT Individual Time: 1300-1415 OT Individual Time Calculation (min): 75 min     Skilled Therapeutic Interventions/Progress Updates:    Pt transferred supine to sit EOB with supervision.  He needed min guard assist for sit to stand with increased time.  Pt noted keeping one hand on the walker and one hand on the bed with sit to stand.  Attempted to have pt push up from the EOB with both hands, but he politely stated he had been used to doing it this way.  Pt ambulated to the ADL apartment to practice walk-in shower transfers with use of the RW.  Pt reports that his mother has a seat and hand held shower he could use. He was able to step posteriorly over the edge of the shower with min guard assist and sit on the seat.  Discussed removing throw rugs from the bathroom as well.  Progressed to educating pt on AE availability.  Pt is able to cross his LEs in sitting without pain most of the time per his report and demonstration but provided education on the need for at least a reacher for picking up items off the floor and maintaining back precautions.  Also discussed other available items such as a LH sponge and sockaide.  Next transitioned to the ADL kitchen where therapist educated pt on the use of walker bag and reacher bag.  Talked about needing to keep both hands on the walker when retrieving a snack or drink and how to use the counter tops and the walker bag to move items around.  Pt reports that his mother will assist him with this as most of the time he eats in his room.  Finished session with return to the pt's room and having him use the RW to ambulate around the room while picking up items from the floor using the reacher.  Pt able to perform with close supervision.  Also placed one item at a height he could reach down to by flexing his knees.  Pt still with  slight bending when attempting to pick it up.  Pt left sitting on EOB with call button and phone within reach.    Therapy Documentation Precautions:  Precautions Precautions: Back, Fall Precaution Comments: pt able to recall 1/3 back precautions without cuing Required Braces or Orthoses: Spinal Brace Spinal Brace: Lumbar corset Restrictions Weight Bearing Restrictions: No  Pain: Pain Assessment Pain Assessment: 0-10 Pain Score: 2  Pain Type: Acute pain Pain Location: Back Pain Intervention(s): Repositioned ADL: ADL ADL Comments: see Functional status  See Function Navigator for Current Functional Status.   Therapy/Group: Individual Therapy  Yarenis Cerino OTR/L 11/04/2014, 4:02 PM

## 2014-11-04 NOTE — Consult Note (Signed)
  [name]   Mr. Dionta Larke is a 59 year old, right-handed man, who was seen for an initial psychodiagnostic examination to evaluate his emotional status in the setting of newly diagnosed cancer.  According to his medical record, he was admitted on 10/21/14 with progressive low back pain over 2 months and right lower extremity weakness as well as a 40 pound weight loss over the past few months.  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  CT of the head was negative for intracranial malignancy.  He underwent spinal decompression and tumor resection and was subsequently admitted for inpatient rehabilitation.  He was referred for the current neuropsychological consult owing to concern about depressed mood.    During the clinical interview, Mr. Farver reported that it has been emotionally "tough" particularly since the day prior to the current evaluation and he was tearful off and on during the current session.  He commented that he sees his life "trickling away."  He acknowledged experiencing depression in the past, but said that he handled it on his own by putting one foot in front of the other.  He said that this situation is slightly different, because he is unable to handle some of his own affairs, though he felt supported by his family in that they are helping with his affairs.  Mr. Quezada lamented that it is difficult to be told what to do, though he understands that as part of his recovery this is necessary.  He denied any major worries or concerns and said that he uses his spirituality to help him cope and when he finds himself stressed about something, he watches television to help himself relax.  He commented, "you got to do the best you can with what you got [sic]."  He stated that he feels ready for whatever comes  in the future, but is prepared to fight for his health.  Mr. Lacko denied any recent suicidal ideation, but admitted to contemplating self-harm many years ago.  He denied interest in participating in psychotherapy to assist with coping.  Overall, Mr. Velardi said that he has "struggled and survived before" and therefore, he expects to do the same now.    IMPRESSIONS AND RECOMMENDATIONS:  Mr. Shepard acknowledged some low mood associated with coping with his current medical situation at the level of an adjustment disorder.  He was provided with validation for his experience and his emotions were normalized.  He was encouraged to continue using spirituality to cope.  Mr. Pickar seemed to enjoy joking and engaging in sarcasm; his therapists may find that it is helpful to interact with him in this manner to optimize participation in sessions.  Continued follow-up with the neuropsychologist could be requested should his treatment team feel that it would be beneficial in informing care.    DIAGNOSIS:   Adjustment disorder with depressed mood  Marlane Hatcher, Psy.D.  Clinical Neuropsychologist

## 2014-11-04 NOTE — Progress Notes (Signed)
Physical Therapy Session Note  Patient Details  Name: Edward Duran MRN: [id number] Date of Birth: 1956/02/10  Today's Date: 11/04/2014 PT Individual Time: 0900-1000 PT Individual Time Calculation (min): 60 min   Short Term Goals: Week 1:  PT Short Term Goal 1 (Week 1): Pt will demonstrate bed mobility with supervision. PT Short Term Goal 2 (Week 1): Pt will transfer with supervision and LRAD.  PT Short Term Goal 3 (Week 1): Pt will amb 150' with LRAD and supervision. PT Short Term Goal 4 (Week 1): Pt will negotiate 8 steps with supervision.  Skilled Therapeutic Interventions/Progress Updates:   Pt received sitting in w/c, c/o 3/10 back pain, agreeable to therapy session. Pt transferring throughout session with mod I. Pt amb to therapy gym with RW mod I.  PT instructed patient in BLE HEP including heel/toe raises, LAQ, pillow squeezes, hip abd with orange TB, SLR, and heel slides.  Pt reports significant pain with heel slides and states he cannot complete this exercise.  PT provided patient with HEP hand out and reviewed all exercises with patient and provided written notes for repetitions, pain levels, and positioning for exercises.  Pt verbalized understanding.  Pt amb to ortho gym and performed car transfer with supervision; pt continues to require verbal cues for sit>stand from car and for maintaining back precautions throughout transfer.  Pt returned to room at end of session and positioned in recliner to comfort with call bell in reach and needs met.  HEP placed in pt's notebook, plan to review with patient during subsequent therapy sessions.     Therapy Documentation Precautions:  Precautions Precautions: Back, Fall Precaution Comments: pt able to recall 1/3 back precautions without cuing Required Braces or Orthoses: Spinal Brace Spinal Brace: Lumbar corset Restrictions Weight Bearing Restrictions: No Pain: Pain Assessment Pain Assessment: 0-10 Pain Score: 3  Pain Location:  Back Pain Orientation: Lower Pain Descriptors / Indicators: Nagging Pain Intervention(s): RN made aware;Emotional support   See Function Navigator for Current Functional Status.   Therapy/Group: Individual Therapy  Caitlin E Penven-Crew 11/04/2014, 10:10 AM

## 2014-11-04 NOTE — Plan of Care (Signed)
Problem: RH PAIN MANAGEMENT Goal: RH STG PAIN MANAGED AT OR BELOW PT'S PAIN GOAL <4  Outcome: Progressing Report pain as 3

## 2014-11-04 NOTE — Progress Notes (Signed)
[facility] PHYSICAL MEDICINE & REHABILITATION     PROGRESS NOTE    Subjective/Complaint States he feels better today. Breathing better. Happy to get a break in therapy,.   ROS: Pt denies fever, rash/itching, headache, blurred or double vision, nausea, vomiting, abdominal pain, diarrhea, chest pain, palpitations, dysuria, dizziness,    Objective: Vital Signs: Blood pressure 153/50, pulse 84, temperature 98.3 F (36.8 C), temperature source Oral, resp. rate 20, height '5\' 6"'$  (1.676 m), weight 56.1 kg (123 lb 10.9 oz), SpO2 98 %. No results found.  Recent Labs  11/04/14 0548  WBC 8.7  HGB 9.3*  HCT 28.0*  PLT 366   No results for input(s): NA, K, CL, GLUCOSE, BUN, CREATININE, CALCIUM in the last 72 hours.  Invalid input(s): CO CBG (last 3)  No results for input(s): GLUCAP in the last 72 hours.  Wt Readings from Last 3 Encounters:  10/30/14 56.1 kg (123 lb 10.9 oz)  10/23/14 57.6 kg (126 lb 15.8 oz)  09/10/14 58.968 kg (130 lb)    Physical Exam:  Constitutional: He is oriented to person, place, and time.  59 year old male appearing much older than stated age, emaciated HENT: dentition poor Head: Normocephalic.  Eyes: EOM are normal.  Neck: Normal range of motion. Neck supple. No thyromegaly present.  Cardiovascular: Normal rate and regular rhythm.  Respiratory: Effort normal. No wheezes!!. No respiratory distress. good air movement GI: Soft. Bowel sounds are normal. He exhibits no distension.  Neurological: He is alert and oriented to person, place, and time.  UE 4/5 delt,bicep,tricep,wrists,hand;, Bilateral LE: 3+hf. 4ke and ad/apf. No sensory findings. Less limited by back pain. Cognitively was intact.  Skin:  Back incision with mild s/s drainage--  at upper aspect  Psych: affect more dynamic, pleasant  Assessment/Plan: 1. Functional deficits secondary to pathological L3 fracture which require 3+ hours per day of interdisciplinary therapy in a comprehensive  inpatient rehab setting. Physiatrist is providing close team supervision and 24 hour management of active medical problems listed below. Physiatrist and rehab team continue to assess barriers to discharge/monitor patient progress toward functional and medical goals.  Function:  Bathing Bathing position   Position: Standing at sink  Bathing parts Body parts bathed by patient: Right arm, Left arm, Chest, Abdomen, Front perineal area, Buttocks, Right upper leg, Left upper leg Body parts bathed by helper: Right lower leg, Left lower leg, Back  Bathing assist Assist Level: Touching or steadying assistance(Pt > 75%)      Upper Body Dressing/Undressing Upper body dressing   What is the patient wearing?: Pull over shirt/dress     Pull over shirt/dress - Perfomed by patient: Thread/unthread right sleeve, Thread/unthread left sleeve, Put head through opening, Pull shirt over trunk       Orthosis activity level: Performed by helper  Upper body assist Assist Level: Touching or steadying assistance(Pt > 75%)      Lower Body Dressing/Undressing Lower body dressing   What is the patient wearing?: Non-skid slipper socks, Underwear, Pants Underwear - Performed by patient: Thread/unthread right underwear leg, Thread/unthread left underwear leg, Pull underwear up/down   Pants- Performed by patient: Thread/unthread right pants leg, Thread/unthread left pants leg Pants- Performed by helper: Pull pants up/down, Fasten/unfasten pants Non-skid slipper socks- Performed by patient: Don/doff right sock, Don/doff left sock Non-skid slipper socks- Performed by helper: Don/doff right sock, Don/doff left sock                  Lower body assist Assist Level: Touching or  steadying assistance (Pt > 75%)      Toileting Toileting Toileting activity did not occur: N/A Toileting steps completed by patient: Adjust clothing prior to toileting, Performs perineal hygiene, Adjust clothing after toileting    Toileting Assistive Devices: Grab bar or rail  Toileting assist Assist level: More than reasonable time   Transfers Chair/bed transfer   Chair/bed transfer method: Ambulatory Chair/bed transfer assist level: Supervision or verbal cues Chair/bed transfer assistive device: Medical sales representative     Max distance: 200 Assist level: No help, No cues, assistive device, takes more than a reasonable amount of time   Wheelchair   Type: Manual Max wheelchair distance: >300 Assist Level: No help, No cues, assistive device, takes more than reasonable amount of time  Cognition Comprehension Comprehension assist level: Follows complex conversation/direction with no assist  Expression Expression assist level: Expresses complex ideas: With extra time/assistive device  Social Interaction Social Interaction assist level: Interacts appropriately 90% of the time - Needs monitoring or encouragement for participation or interaction.  Problem Solving Problem solving assist level: Solves basic problems with no assist  Memory Memory assist level: Recognizes or recalls 75 - 89% of the time/requires cueing 10 - 24% of the time    Medical Problem List and Plan: 1. 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   -continues therapies  -pt needs follow up with [facility] on discharge 2. DVT Prophylaxis/Anticoagulation: SCDs. LE vascular study no evidence DVT on 9/16 3. Pain Management: Oxycodone as needed. Monitor with increased mobility 4. Acute blood loss anemia. Follow-up labs personally reviewed---up to 9.6 hgb 5. Neuropsych: This patient is capable of making decisions on his own behalf.  -affect is approp today  -added PRN xanax as well as scheduled QHS to help with sleep/anxiety  - 6. Skin/Wound Care: Routine skin checks. nutrition 7. Fluids/Electrolytes/Nutrition: Routine I&O. Labs reviewed---replaced k+ 8. Urinary retention.  Continue Flomax.   9. COPD/ Tobacco abuse.    - oxygen  -scheduled nebs have helped---will change back to prn  -will start on symbicort per home routine   10. Hypertension. No current antihypertensives medication. Patient on Benicar 20-12 0.5 mg daily prior to admission.   11. Decreased nutritional storage. Continue nutritional supplements and encouarge po. 12. Leukocytosis:    -wbc's at 8.7 today.  hgb generally stable---I personally reviewed the patient's labs today.   -daily dressing changes as needed--mild,stable output---will need NS follow up  -afebrile  LOS (Days) 7 A FACE TO FACE EVALUATION WAS PERFORMED  SWARTZ,ZACHARY T 11/04/2014 8:35 AM

## 2014-11-04 NOTE — Progress Notes (Signed)
Occupational Therapy Session Notes  Patient Details  Name: Edward Duran MRN: [id number] Date of Birth: 05/05/1955  Today's Date: 11/04/2014  Short Term Goals: Week 1:  OT Short Term Goal 1 (Week 1): Pt will peform toileting with min A (3/3 steps) OT Short Term Goal 2 (Week 1): Pt will perform shower transfer with miin A OT Short Term Goal 3 (Week 1): PT will bathe at shower level with AE and remained seated with min A (due to back precautions) OT Short Term Goal 4 (Week 1): Pt will don LB clothing with AE with min A  Skilled Therapeutic Interventions/Progress Updates:   Session #1 [phone] - 81 Minutes Individual Therapy Initially when asked about pain, pt replied "I haven't even thought about it". During ADL activity, pt with 3/10 complaints of pain Pt received supine in bed. Pt able to verbalize 3/3 back precautions with minimal visual cues from therapist. Pt engaged in bed mobility with good log roll technique. Pt then sat EOB to don back brace. From here, pt stood with RW (min cueing required for technique). Pt ambulated > sink, sat in w/c, and gathered clothing out of closet. Pt with request to use BR, pt ambulated using RW into BR for toilet transfer and toileting needs. Pt required mod assist to stand from toilet due to lower seat and poor initial technique. Therapist educated pt on proper technique for sit<>stands from low surfaces. Pt ambulated > sink, sat in w/c, and completed UB/LB bathing & dressing in sit<>stand position; back brace donned whenever standing. Therapist educated pt on compensatory strategies in order to adhere to back precautions during LB ADLs. At end of session, left pt seated in w/c beside bed with all needs within reach.   During session, at times, pt sounded OOB. Every time sats checked=94% or greater. Therapist encouraged pursed lip breathing when pt sounded OOB. Pt stated his nose had been broken 4 times and he was unable to breathe in through nose.    Session #2 10:17-10:30 - 13 minutes  Individual Therapy Pt received supine in bed with increased pain, no rate given (6/10 for face score).  Pt refused to get OOB due to pain. Pt engaged in bed mobility to doff brace and pull self up in bed. Cues needed for pt to flex knees and push through BLEs instead of pulling with just arms and causing back to arch. Therapist educated pt on BUE exercise to improve overall strength and endurance. Encouraged pt to perform these exercises throughout the day. 3 exercises completed, 2 sets of 10 repetitions. At end of session, left pt supine in bed with all needs within reach; pt aware to call for assistance.   Therapy Documentation Precautions:  Precautions Precautions: Back, Fall Precaution Comments: pt able to recall 1/3 back precautions without cuing Required Braces or Orthoses: Spinal Brace Spinal Brace: Lumbar corset Restrictions Weight Bearing Restrictions: No  Vital Signs: Therapy Vitals Temp: 98.3 F (36.8 C) Temp Source: Oral Pulse Rate: 84 Resp: 20 BP: (!) 153/50 mmHg Patient Position (if appropriate): Lying Oxygen Therapy SpO2: 98 % O2 Device: Not Delivered  ADL: ADL ADL Comments: see Functional status   See Function Navigator for Current Functional Status.  CLAY,PATRICIA , MS, OTR/L, CLT Pager: [phone]  11/04/2014, 8:38 AM

## 2014-11-05 ENCOUNTER — Inpatient Hospital Stay (HOSPITAL_COMMUNITY): Payer: Self-pay | Admitting: Occupational Therapy

## 2014-11-05 ENCOUNTER — Inpatient Hospital Stay (HOSPITAL_COMMUNITY): Payer: Medicaid Other | Admitting: Physical Therapy

## 2014-11-05 DIAGNOSIS — F4321 Adjustment disorder with depressed mood: Secondary | ICD-10-CM

## 2014-11-05 NOTE — Progress Notes (Signed)
Occupational Therapy Note  Patient Details  Name: Edward Duran MRN: [id number] Date of Birth: 11/18/1955  Today's Date: 11/05/2014 OT Missed Time: 39 Minutes Missed Time Reason: Patient unwilling/refused to participate without medical reason  Upon entering pt's room, he refused to participate in OT intervention this session. Pt reporting, "I don't hurt too much but I just feel real bad." Pt politely declined OOB activity today. Discussed with SW. OT provided maximal encouragement and educated pt on goals but he continued to refuse. Pt remains supine in bed with call bell and all needed items within reach upon exiting the room.    Willeen Cass [facility] 11/05/2014, 11:12 AM

## 2014-11-05 NOTE — Discharge Summary (Signed)
Discharge summary job # [id number]

## 2014-11-05 NOTE — Progress Notes (Signed)
Physical Therapy Session Note  Patient Details  Name: Edward Duran MRN: [id number] Date of Birth: May 25, 1955  Today's Date: 11/05/2014 PT Individual Time: 1100-1200 and 7628-3151 PT Individual Time Calculation (min): 60 min and 70 min   Short Term Goals: Week 1:  PT Short Term Goal 1 (Week 1): Pt will demonstrate bed mobility with supervision. PT Short Term Goal 2 (Week 1): Pt will transfer with supervision and LRAD.  PT Short Term Goal 3 (Week 1): Pt will amb 150' with LRAD and supervision. PT Short Term Goal 4 (Week 1): Pt will negotiate 8 steps with supervision.  Skilled Therapeutic Interventions/Progress Updates:    Session 1: Pt received supine in bed, sleeping soundly, but easily awakened and agreeable to therapy.  Pt reports no pain this morning through grimaces with movement.  Session focused on pt education and HEP.  PT reviewed HEP given to patient yesterday, but patient demonstrated no recollection of the exercises or how to perform them.  PT re-wrote instructions and drew pictures for patient to have clearer instructions.  Pt able to demonstrate each exercise correctly with new written HEP.  Pt performed BLE therex 2x10 seated heel/toe raises, LAQ, marching, pillow squeezes, hip abd with orange TB, and standing minisquats and calf raises.  Pt performed all exercises with min verbal cues for slowing down and correct form.  Will review HEP again in PM session for carryover.  Pt returned to room at end of session and toileted mod I. Pt positioned in recliner at end of session with lunch tray arriving.  Call bell in reach and needs met.    Session 2: Pt received in recliner and agreeable to therapy session.  Session focused on reassessment of patient functional mobility including bed mobility, transfers, gait, stair negotiation, balance, car transfers, w/c mobility, and strength.  PT reviewed spinal precautions and pt able to recall 2/3 spinal precautions with verbal cues needed to recall  no twisting.  PT reviewed HEP with patient and pt able to demonstrate teach back with occasional verbal cues from PT for correct form.  PT reviewed d/c plans with patient and plans for family ed tomorrow.  Pt verbalized understanding and relays excitement about going home tomorrow.  Pt positioned in recliner at end of session with call bell in reach and needs met.    Therapy Documentation Precautions:  Precautions Precautions: Back, Fall Precaution Comments: pt able to recall 2/3 back precautions without cuing Required Braces or Orthoses: Spinal Brace Spinal Brace: Lumbar corset Restrictions Weight Bearing Restrictions: No  See Function Navigator for Current Functional Status.   Therapy/Group: Individual Therapy  Earnest Conroy Penven-Crew 11/05/2014, 4:32 PM

## 2014-11-05 NOTE — Progress Notes (Signed)
Physical Therapy Discharge Summary  Patient Details  Name: Edward Duran MRN: [id number] Date of Birth: 1955-05-06  Today's Date: 10/18/2014 PT Individual Time:  PT Individual Time Calculation (min):      Patient has met 10 of 10 long term goals due to improved activity tolerance, improved balance, improved postural control, increased strength and improved awareness.  Patient to discharge at an ambulatory level Supervision.   Patient's care partner is independent to provide the necessary supervision assistance at discharge.  **Patient requires intermittent supervision for recalling and respecting spinal precautions with functional activities such as car transfers and occasionally bed>chair transfers.    Recommendation:  Patient will benefit from ongoing skilled PT services in home health setting to continue to advance safe functional mobility, address ongoing impairments in strength, activity tolerance, safety awareness, and balance, and minimize fall risk.  Equipment: w/c  Reasons for discharge: treatment goals met  Patient/family agrees with progress made and goals achieved: Yes   Skilled Therapeutic Intervention: Pt seen for family education session. Pt received in recliner with son, [place], present and agreeable to therapy session.  PT provided patient and family education for back precautions, HEP, w/c parts management, ambulation, basic transfers, car transfers, bed mobility, and stair negotiation.  Pt currently performing all mobility with supervision and min verbal cues for maintaining spinal precautions.  Son verbalized understanding and demonstrated appropriate cues for patient when patient broke spinal precautions during activity.  Pt returned to room at end of session and positioned with call bell in reach and needs met.   PT Discharge Precautions/Restrictions Precautions Precautions: Back;Fall Precaution Comments: pt able to recall 2/3 back precautions without  cuing Required Braces or Orthoses: Spinal Brace Spinal Brace: Lumbar corset Restrictions Weight Bearing Restrictions: No   Pain Pain Assessment Pain Assessment: 0-10 Pain Score: 3  Pain Type: Surgical pain Pain Location: Back Pain Orientation: Lower Pain Intervention(s): Emotional support   Cognition Overall Cognitive Status: Within Functional Limits for tasks assessed Orientation Level: Oriented to place;Oriented to person;Other (comment);Disoriented to place;Disoriented to time (needs cues for date and situation) Memory: Impaired Memory Impairment: Decreased short term memory;Decreased recall of new information Problem Solving Impairment: Functional complex   Sensation Sensation Light Touch: Appears Intact  Coordination Gross Motor Movements are Fluid and Coordinated: No Fine Motor Movements are Fluid and Coordinated:  (decreased accuracy of finger opposition on R, improved from time of eval) Finger Nose Finger Test: [facility] bilaterally Heel Shin Test: dereased accuracy R>L   Motor  Motor Motor - Discharge Observations: persistant acute pain, weakness and activity tolerance improving   Mobility Bed Mobility Bed Mobility: Rolling Left;Rolling Right;Supine to Sit;Sit to Supine Rolling Right: 6: Modified independent (Device/Increase time) Rolling Left: 6: Modified independent (Device/Increase time) Supine to Sit: 6: Modified independent (Device/Increase time) Sit to Supine: 6: Modified independent (Device/Increase time)  Transfers Transfers: Yes Sit to Stand: 6: Modified independent (Device/Increase time) Stand to Sit: 6: Modified independent (Device/Increase time)   Locomotion  Ambulation Ambulation: Yes Ambulation/Gait Assistance: 5: Supervision Ambulation Distance (Feet): 250 Feet Assistive device: Rolling walker Ambulation/Gait Assistance Details: slow gait sped, trunk flexed Gait Gait: Yes Gait Pattern: Impaired Gait Pattern: Decreased step length -  right;Decreased step length - left;Decreased trunk rotation;Step-through pattern;Trunk flexed Stairs / Additional Locomotion Stairs: Yes Stairs Assistance: 5: Supervision Stairs Assistance Details: Verbal cues for technique;Verbal cues for sequencing Stair Management Technique: Two rails;Forwards;Alternating pattern;Step to pattern Number of Stairs: 12   Trunk/Postural Assessment  Cervical Assessment Cervical Assessment: Exceptions to [facility] (forward head )  Thoracic Assessment Thoracic Assessment: Exceptions to WFL (slight kyphosis) Lumbar Assessment Lumbar Assessment: Exceptions to WFL (posterior pelvic tilt in sitting) Postural Control Postural Control: Within Functional Limits   Balance Balance Balance Assessed: Yes  Standardized Balance Assessment Standardized Balance Assessment: Timed Up and Go Test Timed Up and Go Test TUG: Normal TUG Normal TUG (seconds): 21.21  Dynamic Sitting Balance Dynamic Sitting - Level of Assistance: 6: Modified independent (Device/Increase time) Dynamic Sitting - Balance Activities: Reaching for objects;Reaching for weighted objects  Static Standing Balance Static Standing - Balance Support: No upper extremity supported Static Standing - Level of Assistance: 6: Modified independent (Device/Increase time)  Dynamic Standing Balance Dynamic Standing - Balance Support: Left upper extremity supported;Right upper extremity supported Dynamic Standing - Level of Assistance: 6: Modified independent (Device/Increase time) Dynamic Standing - Balance Activities: Reaching for objects;Reaching for weighted objects;Reaching across midline Extremity Assessment      RLE Assessment RLE Assessment: Exceptions to WFL RLE Strength RLE Overall Strength Comments: hip flexion 3+/5, knee extension 4/5, knee flexion 4/5, DF 4/5, PF 4/5 LLE Assessment LLE Assessment: Exceptions to WFL LLE Strength LLE Overall Strength Comments: hip flexion 3/5, knee extension 3+/5,  knee flexion 3+/5, DF 4/5, PF 4/5    See Function Navigator for Current Functional Status.  Caitlin E Penven-Crew 11/05/2014, 3:54 PM   

## 2014-11-05 NOTE — Progress Notes (Signed)
Nutrition Follow-up  DOCUMENTATION CODES:   Severe malnutrition in context of chronic illness  INTERVENTION:  Continue Boost Plus po TID, each supplement provides 360 kcal and 14 grams of protein.   Encourage adequate PO intake.   Educated on continuation of nutritional supplementation of patient's choice once discharged home.   NUTRITION DIAGNOSIS:   Malnutrition related to chronic illness as evidenced by severe depletion of body fat, severe depletion of muscle mass; ongoing  GOAL:   Patient will meet greater than or equal to 90% of their needs; progressing  MONITOR:   PO intake, Supplement acceptance, Weight trends, Labs, I & O's  REASON FOR ASSESSMENT:   Malnutrition Screening Tool    ASSESSMENT:   59 y.o. right handed male with history of hypertension, COPD, tobacco abuse. Patient lives with his 52 year old mother independent with a single-point cane sometimes requiring walker prior to admission. Presented 10/21/2014 with progressive low back pain 2 months and right lower extremity weakness as well as a 40 pound weight loss over the past few months. He denied bowel or bladder incontinence. X-ray/MRI and imaging revealed pathologic fracture of L3 with tumor invasion extending into the posterior elements and transverse process on the right. 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 In addition a nonenhancing lesion at L2 and T10 was seen suspicious for metastatic disease.  Meal completion has been varied from 40-100%. Pt reports having a good appetite. Pt currently has Boost Plus ordered and has been refusing most of them. Pt reports he would still like to continue with them. Pt does report he consumes Ensure at home and prefers that taste better. RD offered Ensure instead of Boost, however pt refused as he is being discharged home tomorrow. Pt was educated to continue supplementation at home especially if po intake is poor  to aid in caloric and protein needs. Pt expressed understanding. Pt was encouraged to eat his food at meals.   Diet Order:  Diet regular Room service appropriate?: Yes; Fluid consistency:: Thin  Skin:   (Incision on back, wound on L ear)  Last BM:  9/20  Height:   Ht Readings from Last 1 Encounters:  10/28/14 '5\' 6"'$  (1.676 m)    Weight:   Wt Readings from Last 1 Encounters:  10/30/14 123 lb 10.9 oz (56.1 kg)    Ideal Body Weight:  64.5 kg  BMI:  Body mass index is 19.97 kg/(m^2).  Estimated Nutritional Needs:   Kcal:  1800-2000  Protein:  80-90 grams  Fluid:  1.8 - 2 L/day  EDUCATION NEEDS:   No education needs identified at this time  Corrin Parker, MS, RD, LDN Pager # [phone] After hours/ weekend pager # [phone]

## 2014-11-05 NOTE — Discharge Summary (Signed)
Edward Duran, Edward Duran                 ACCOUNT NO.:  [id number]  MEDICAL RECORD NO.:  [id number]  LOCATION:  4M12C                        FACILITY:  [facility]  PHYSICIAN:  Lauraine Rinne, P.A.  DATE OF BIRTH:  Nov 12, 1955  DATE OF ADMISSION:  10/28/2014 DATE OF DISCHARGE:  11/06/2014                              DISCHARGE SUMMARY   DISCHARGE DIAGNOSES: 1. Functional deficits secondary to pathologic lumbar L3 fracture due     to lung mass. 2. Pain management. 3. SCDs for DVT prophylaxis. 4. Acute blood loss anemia. 5. Urinary retention resolved. 6. Chronic obstructive pulmonary disease (COPD) with tobacco abuse. 7. Hypertension. 8. Decreased nutritional storage.  HISTORY OF PRESENT ILLNESS:  This is a 59 year old right-handed male, with history of hypertension, COPD as well as tobacco abuse.  He lives with his 56 year old mother, who was independent with a single-point cane, prior to admission.  Presented on October 21, 2014, with progressive low back pain x2 months and right lower extremity weakness and a 40-pound weight loss over the past few months.  Denied any bowel or bladder disturbances.  X-rays, MRI and imaging revealed pathologic fracture of L3 with tumor invasion, extending into the posterior elements and transverse process on the right.  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  In addition, a non enhancing lesion at L2 with T10 was seen suspicious for metastatic disease.  Incidental right lower lobe lesion posteriorly 2 x 2.5 cm with right hilar adenopathy noted.  CT abdomen and pelvis remarkable for noted findings as well as complete collapse of the visualized right middle lobe.  CT of the head negative, underwent laminectomy inferior lumbar L2-L3 superior L4 for decompression tumor resection with corpectomy and fusion by Dr. Cyndy Freeze on October 23, 2014.  Pathology report was pending,.  Hospital course, pain  management.  Acute blood loss anemia he was transfused. Postoperative respiratory failure requiring full ventilatory support. Slowly weaned for mechanical support.  Oncology Services, Dr. Lindi Adie consulted.  Plan was to follow up with Dr. Ulyses Southward, [facility] on discharge.  The patient was admitted for a comprehensive rehab program.  PAST MEDICAL HISTORY:  See discharge diagnoses.  SOCIAL HISTORY:  Lives with an 39 year old elderly mother, independent prior to admission with a cane.  Functional status upon admission to rehab services minimal assist, 80 feet rolling walker, sit-to-stand min assist, min-to-mod assist activities of daily living.  PHYSICAL EXAMINATION:  VITAL SIGNS:  Blood pressure 146/92, pulse 106 temperature 98, and respirations 17. GENERAL:  This was an alert male, oriented x3.  Appearing older than stated age. LUNGS:  Clear to auscultation. CARDIAC:  Regular rate and rhythm. ABDOMEN:  Soft, nontender, and good bowel sounds.  REHABILITATION HOSPITAL COURSE:  The patient was admitted to inpatient rehab services with therapies initiated on a 3-hour daily basis, consisting of physical therapy, occupational therapy, and rehabilitation nursing.  The following issues were addressed during the patient's rehabilitation stay.  Pertaining to [name] pathologic 3 fracture, had undergone stabilization by Dr. Cyndy Freeze of Neurosurgery and would follow up outpatient oncology Dr. Ulyses Southward, at [facility] on Discharge.Still with some serosanginous drainage and afebrile  with plan dry dressing and home health nurse and follow up Dr Cyndy Freeze one week.  SCDs in place for DVT prophylaxis.  Pain management with the use of oxycodone.  Hemoglobin and hematocrit remained stable.  He was on Flomax for some urinary retention that did continue to improve.  Her mobility.  He continued on nutritional supplements for nutritional support.  He did have a history of  tobacco abuse.  He did receive counseling in regard to cessation of nicotine products and it was quite questionable if he would be compliant with these request.  Blood pressure is controlled on no current antihypertensive medications.  He had been on Benicar prior to admission.  The patient received weekly collaborative interdisciplinary team conferences to discuss estimated length of stay, family teaching, and any barriers to discharge.  He transferred modified independence, ambulate to the therapy gym with a rolling walker, modified independent.  Performed all exercises.  He verbalized a good understanding of his overall care.  Transfers in and out of the car with supervision.  He was able to gather his belongings for activities of daily living and homemaking.  Full family teaching was completed and plan discharge to home.  DISCHARGE MEDICATIONS:  Included; 1. Xanax 0.25 mg t.i.d. as needed 0.5 mg at bedtime. 2. Symbicort 2 puffs twice daily. 3. Folic acid 1 mg daily. 4. Robaxin 500 mg every 6 hours as needed for muscle spasms. 5. Multivitamin 1 tablet daily. 6. Oxycodone immediate release 5-10 mg every 4 as needed pain,     dispense of 90 tablets. 7. MiraLax 17 g twice daily, hold for loose stool. 8. Flomax 0.4 mg p.o. daily.  DIET:  Regular.  SPECIAL INSTRUCTIONS: Dry dressing to back incision twice daily as needed     The patient would followup with Dr. Alger Simons at the outpatient rehab service office as needed; Dr. Ulyses Southward, call for appointment Oncology Services; Dr. Marland Kitchen Ditty of Neurosurgery.     Lauraine Rinne, P.A.     DA/MEDQ  D:  11/05/2014  T:  11/05/2014  Job:  [id number]

## 2014-11-05 NOTE — Progress Notes (Signed)
[facility]IC[facility] RE[facility]ILITATION     PROGRESS NOTE    Subjective/Complaint  Good appetite. Happy to be nearing discharge   ROS: Pt denies fever, rash/itching, headache, blurred or double vision, nausea, vomiting, abdominal pain, diarrhea, chest pain, palpitations, dysuria, dizziness,    Objective: Vital Signs: Blood pressure 116/69, pulse 106, temperature 98 F (36.7 C), temperature source Oral, resp. rate 18, height '5\' 6"'$  (1.676 m), weight 56.1 kg (123 lb 10.9 oz), SpO2 98 %. No results found.  Recent Labs  11/04/14 0548  WBC 8.7  HGB 9.3*  HCT 28.0*  PLT 366   No results for input(s): NA, K, CL, GLUCOSE, BUN, CREATININE, CALCIUM in the last 72 hours.  Invalid input(s): CO CBG (last 3)  No results for input(s): GLUCAP in the last 72 hours.  Wt Readings from Last 3 Encounters:  10/30/14 56.1 kg (123 lb 10.9 oz)  10/23/14 57.6 kg (126 lb 15.8 oz)  09/10/14 58.968 kg (130 lb)    Physical Exam:  Constitutional: He is oriented to person, place, and time.  59 year old male appearing much older than stated age, emaciated HENT: dentition poor Head: Normocephalic.  Eyes: EOM are normal.  Neck: Normal range of motion. Neck supple. No thyromegaly present.  Cardiovascular: Normal rate and regular rhythm.  Respiratory: Effort normal. No wheezes!!. No respiratory distress. good air movement GI: Soft. Bowel sounds are normal. He exhibits no distension.  Neurological: He is alert and oriented to person, place, and time.  UE 4/5 delt,bicep,tricep,wrists,hand;, Bilateral LE: 3+hf. 4ke and ad/apf. No sensory findings. Less limited by back pain. Cognitively was intact.  Skin:  Back incision with mild s/s drainage--  at upper aspect still. Occasional pocketing. Psych: affect more dynamic, pleasant  Assessment/Plan: 1. Functional deficits secondary to pathological L3 fracture which require 3+ hours per day of interdisciplinary therapy in a comprehensive  inpatient rehab setting. Physiatrist is providing close team supervision and 24 hour management of active medical problems listed below. Physiatrist and rehab team continue to assess barriers to discharge/monitor patient progress toward functional and medical goals.  Function:  Bathing Bathing position   Position: Standing at sink (sit<>stand)  Bathing parts Body parts bathed by patient: Right arm, Left arm, Chest, Abdomen, Front perineal area, Buttocks, Right upper leg, Left upper leg, Right lower leg, Left lower leg Body parts bathed by helper: Right lower leg, Left lower leg, Back  Bathing assist Assist Level: Touching or steadying assistance(Pt > 75%) (during standing)      Upper Body Dressing/Undressing Upper body dressing   What is the patient wearing?: Pull over shirt/dress     Pull over shirt/dress - Perfomed by patient: Thread/unthread right sleeve, Thread/unthread left sleeve, Put head through opening, Pull shirt over trunk       Orthosis activity level: Performed by helper (therapist assisted, encouraged pt to do as much as he could)  Upper body assist Assist Level: Supervision or verbal cues      Lower Body Dressing/Undressing Lower body dressing   What is the patient wearing?: Underwear, Pants, Socks, Shoes Underwear - Performed by patient: Thread/unthread right underwear leg, Thread/unthread left underwear leg, Pull underwear up/down   Pants- Performed by patient: Thread/unthread right pants leg, Thread/unthread left pants leg, Pull pants up/down Pants- Performed by helper: Pull pants up/down, Fasten/unfasten pants Non-skid slipper socks- Performed by patient: Don/doff right sock, Don/doff left sock Non-skid slipper socks- Performed by helper: Don/doff right sock, Don/doff left sock   Socks - Performed by helper: Don/doff  right sock, Don/doff left sock   Shoes - Performed by helper: Don/doff right shoe, Don/doff left shoe          Lower body assist Assist  Level: Touching or steadying assistance (Pt > 75%) (sit<>stand)      Toileting Toileting Toileting activity did not occur: N/A Toileting steps completed by patient: Adjust clothing prior to toileting, Performs perineal hygiene, Adjust clothing after toileting   Toileting Assistive Devices: Grab bar or rail  Toileting assist Assist level: Supervision or verbal cues   Transfers Chair/bed transfer   Chair/bed transfer method: Ambulatory Chair/bed transfer assist level: No Help, no cues, assistive device, takes more than a reasonable amount of time Chair/bed transfer assistive device: Medical sales representative     Max distance: 200 Assist level: No help, No cues, assistive device, takes more than a reasonable amount of time   Wheelchair   Type: Manual Max wheelchair distance: >300 Assist Level: No help, No cues, assistive device, takes more than reasonable amount of time  Cognition Comprehension Comprehension assist level: Follows complex conversation/direction with extra time/assistive device  Expression Expression assist level: Expresses complex ideas: With extra time/assistive device  Social Interaction Social Interaction assist level: Interacts appropriately with others with medication or extra time (anti-anxiety, antidepressant).  Problem Solving Problem solving assist level: Solves complex 90% of the time/cues < 10% of the time  Memory Memory assist level: Recognizes or recalls 90% of the time/requires cueing < 10% of the time    Medical Problem List and Plan: 1. 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   -continues therapies  -pt needs follow up with [facility] on discharge 2. DVT Prophylaxis/Anticoagulation: SCDs. LE vascular study no evidence DVT on 9/16 3. Pain Management: Oxycodone as needed. Monitor with increased mobility 4. Acute blood loss anemia. Follow-up labs personally reviewed---up to 9.6  hgb 5. Neuropsych: This patient is capable of making decisions on his own behalf.  -affect is a little better as we near discharge  -added PRN xanax as well as scheduled QHS to help with sleep/anxiety   6. Skin/Wound Care: Routine skin checks. Needs qd to bid dressing 7. Fluids/Electrolytes/Nutrition: Routine I&O. Labs reviewed---replaced k+ 8. Urinary retention. Continue Flomax.   9. COPD/ Tobacco abuse.    - oxygen  -scheduled nebs have helped---will change back to prn  -resumed symbicort per home routine   10. Hypertension. No current antihypertensives medication. Patient on Benicar 20-12 0.5 mg daily prior to admission.   11. Decreased nutritional storage. Continue nutritional supplements and encouarge po. 12. Leukocytosis:    -wbc's at 8.7.  hgb generally stable---I personally reviewed the patient's labs today.   -daily dressing changes as needed--mild,stable output---will need NS follow up  -afebrile  LOS (Days) 8 A FACE TO FACE EVALUATION WAS PERFORMED  SWARTZ,ZACHARY T 11/05/2014 8:12 AM

## 2014-11-06 ENCOUNTER — Ambulatory Visit (HOSPITAL_COMMUNITY): Payer: Self-pay | Admitting: Physical Therapy

## 2014-11-06 ENCOUNTER — Encounter (HOSPITAL_COMMUNITY): Payer: Self-pay | Admitting: Occupational Therapy

## 2014-11-06 MED ORDER — ADULT MULTIVITAMIN W/MINERALS CH: 1.0000 | ORAL_TABLET | Freq: Every day | ORAL | Status: AC

## 2014-11-06 MED ORDER — FOLIC ACID 1 MG PO TABS: 1.0000 mg | ORAL_TABLET | Freq: Every day | ORAL | Status: AC

## 2014-11-06 MED ORDER — OXYCODONE HCL 5 MG PO TABS: 5.0000 mg | ORAL_TABLET | ORAL | Status: DC | PRN

## 2014-11-06 MED ORDER — BUDESONIDE-FORMOTEROL FUMARATE 160-4.5 MCG/ACT IN AERO: 2.0000 | INHALATION_SPRAY | Freq: Two times a day (BID) | RESPIRATORY_TRACT | Status: AC

## 2014-11-06 MED ORDER — ALPRAZOLAM 0.25 MG PO TABS: 0.2500 mg | ORAL_TABLET | Freq: Three times a day (TID) | ORAL | Status: DC | PRN

## 2014-11-06 MED ORDER — TAMSULOSIN HCL 0.4 MG PO CAPS: 0.4000 mg | ORAL_CAPSULE | Freq: Every day | ORAL | Status: AC

## 2014-11-06 MED ORDER — METHOCARBAMOL 500 MG PO TABS: 500.0000 mg | ORAL_TABLET | Freq: Four times a day (QID) | ORAL | Status: AC | PRN

## 2014-11-06 NOTE — Progress Notes (Signed)
Occupational Therapy Discharge Summary and OT intervention  Patient Details  Name: Edward Duran MRN: [id number] Date of Birth: 1955/11/07  Today's Date: 11/06/2014 OT Individual Time: 1300-1340 OT Individual Time Calculation (min): 40 min    Patient has met 12 of 12 long term goals due to improved activity tolerance, improved balance, postural control and ability to compensate for deficits.  Patient to discharge at overall Supervision level.  Patient's care partner is independent to provide the necessary physical assistance at discharge.    Reasons goals not met: all goals met  Recommendation:  Patient will benefit from ongoing skilled OT services in home health setting to continue to advance functional skills in the area of BADL and iADL.  Equipment: Pt and son report pt owning all recommended equipment  Reasons for discharge: treatment goals met and discharge from hospital  Patient/family agrees with progress made and goals achieved: Yes   OT Intervention: Upon entering the room, pt seated in recliner chair with 4/10 c/o pain in lower back this session. Son present for family education as pt will discharge after therapies today. Pt declined shower this session and reports completing all self care tasks prior to session with supervision only. OT educated caregiver on pt progress and recommendation when going home in regards to safety. OT educated and demonstrated use of shower guard and other needed materials to cover surgical incision on lower back for showers in order to decrease risk of infection. OT educated pt that he was not to shower without it covered properly and caregiver and pt verbalized understanding. Caregiver returning demonstration of how to cover incision with materials. OT educated caregiver on back precautions and when to don/doff lumbar corset. OT provided functional tasks/activities with pt stating how to maintain back restrictions to complete activity. Energy  conservation handout provided to caregiver and pt as well . No further questions from pt or caregiver at this time. Call bell and all needed items within reach upon exiting the room.   OT Discharge Precautions/Restrictions  Precautions Precautions: Back;Fall Precaution Comments: pt able to recall 3/3 back precautions without cuing Required Braces or Orthoses: Spinal Brace Spinal Brace: Lumbar corset Restrictions Weight Bearing Restrictions: No Pain Pain Assessment Pain Assessment: 0-10 Pain Score: 3  Pain Type: Surgical pain Pain Location: Back Pain Orientation: Lower Pain Descriptors / Indicators: Aching Pain Onset: Gradual Patients Stated Pain Goal: 0 Pain Intervention(s): Medication (See eMAR) Multiple Pain Sites: No ADL ADL ADL Comments: see Functional status Vision/Perception  Vision- History Baseline Vision/History: Wears glasses Wears Glasses: Reading only Patient Visual Report: Eye fatigue/eye pain/headache;Blurring of vision  Cognition Overall Cognitive Status: Within Functional Limits for tasks assessed Arousal/Alertness: Awake/alert Orientation Level: Oriented to person;Oriented to place;Oriented to situation Attention: Alternating Selective Attention: Appears intact Memory: Impaired Memory Impairment: Decreased short term memory;Decreased recall of new information Problem Solving Impairment: Functional complex Sensation Sensation Light Touch: Appears Intact Stereognosis: Appears Intact Proprioception: Appears Intact Coordination Gross Motor Movements are Fluid and Coordinated: No Fine Motor Movements are Fluid and Coordinated: No Coordination and Movement Description: decreased dexterity Motor  Motor Motor - Discharge Observations: persistant acute pain, weakness and activity tolerance improving Mobility  Bed Mobility Bed Mobility: Rolling Left;Rolling Right;Supine to Sit;Sit to Supine Rolling Right: 6: Modified independent (Device/Increase  time) Rolling Left: 6: Modified independent (Device/Increase time) Supine to Sit: 6: Modified independent (Device/Increase time) Sit to Supine: 6: Modified independent (Device/Increase time) Sit to Sidelying Left Details: Tactile cues for posture;Tactile cues for placement;Verbal cues for technique;Verbal cues for  sequencing;Manual facilitation for placement  Trunk/Postural Assessment  Cervical Assessment Cervical Assessment: Exceptions to [facility] (forward head) Thoracic Assessment Thoracic Assessment: Exceptions to [facility] (kyphotic) Lumbar Assessment Lumbar Assessment: Exceptions to [facility] (posterior pelvic tilt) Postural Control Postural Control: Within Functional Limits  Balance Balance Balance Assessed: Yes Dynamic Sitting Balance Dynamic Sitting - Level of Assistance: 6: Modified independent (Device/Increase time) Dynamic Sitting - Balance Activities: Reaching for objects;Reaching for weighted objects Static Standing Balance Static Standing - Balance Support: No upper extremity supported Static Standing - Level of Assistance: 6: Modified independent (Device/Increase time) Dynamic Standing Balance Dynamic Standing - Balance Support: Left upper extremity supported;Right upper extremity supported Dynamic Standing - Level of Assistance: 6: Modified independent (Device/Increase time);5: Stand by assistance Dynamic Standing - Balance Activities: Reaching for objects;Reaching for weighted objects;Reaching across midline Extremity/Trunk Assessment RUE Assessment RUE Assessment: Within Functional Limits LUE Assessment LUE Assessment: Within Functional Limits   See Function Navigator for Current Functional Status.  Edward Duran 11/06/2014, 2:42 PM

## 2014-11-06 NOTE — Progress Notes (Signed)
Social Work Discharge Note  The overall goal for the admission was met for:   Discharge location: Yes - home with his mother and aunt with son to support  Length of Stay: Yes - 9 days  Discharge activity level: Yes - 24/7 supervision  Home/community participation: Yes   Services provided included: MD, RD, PT, OT, RN, Pharmacy, Neuropsych and SW  Financial Services: Other: self pay - applied for Medicaid  Follow-up services arranged: Home Health: RN to assess and monitor skin, DME: 16"x18" w/c with basic cushion and Patient/Family has no preference for HH/DME agencies  Comments (or additional information):  Pt to d/c to his mother's home where he stays with his mother and aunt.  Son will be supportive in helping at home as needed, as well as getting pt to appointments and coordinating care.  Pt's first ex-wife (Rocky's mother) is helping to appeal disability determination.  Son is helping with the medicaid application. Pt to f/u with cancer center, so primary care appointment not made at this time.  Pt can contact Clinic as needed.  Patient/Family verbalized understanding of follow-up arrangements: Yes  Individual responsible for coordination of the follow-up plan: pt and his son, [place]  Confirmed correct DME delivered: Trey Sailors 11/06/2014    Prevatt, Silvestre Mesi

## 2014-11-06 NOTE — Patient Care Conference (Signed)
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 Adjustment disorder with depressed mood   . Lung mass 10/28/2014  . Debility 10/28/2014  . Metastatic cancer to spine 10/23/2014  . Shock circulatory 10/23/2014  . Respiratory failure, post-operative 10/23/2014  . Hypomagnesemia 10/23/2014  . HTN (hypertension) 10/21/2014  . 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 Hilar mass 10/21/2014  . COPD (chronic obstructive pulmonary disease) 10/21/2014  . Leg weakness 10/21/2014  . AKI (acute kidney injury) 10/21/2014  . Hyperglycemia 10/21/2014  . Pathological fracture 10/21/2014  . Carotid artery disease     Expected Discharge Date: Expected Discharge Date: 11/06/14  Team Members Present: Physician leading conference: Dr. Alger Simons Social Worker Present: Lennart Pall, LCSW Nurse Present: Dorien Chihuahua, RN PT Present: Carney Living, PT;Caitlin Penven-Crew, PT OT Present: Willeen Cass, OT SLP Present: Weston Anna, SLP PPS Coordinator present : Daiva Nakayama, RN, CRRN     Current Status/Progress Goal Weekly Team Focus  Medical   near goals, doesn't have a lot of interest in therapy, wound issues  see prior  ego support, wound care.   Bowel/Bladder   Continent of bowel and bladder. LBM 11/03/14  Pt to remain continent of  bowel and bladder  Monitor   Swallow/Nutrition/ Hydration             ADL's   supervision to min A depending on how he feels and activity tolerance  overall supervision  family education, d/c planning and safety at home   Mobility   supervision/mod I  supervision locomotion, mod I transfers  grad day Tuesday, d/c Wed   Communication             Safety/Cognition/ Behavioral Observations            Pain   Oxy IR '10mg'$  q 6hrs prn  <4  Assess for nonverbal cues of pain   Skin   Back incision with dermabond, CDI, OTA..Scab to L ear  No additional skin breakdown  Assess q shift    Rehab Goals Patient on target to meet rehab goals: Yes Rehab Goals Revised: none *See Care Plan and progress notes for long and short-term goals.  Barriers to Discharge: cancer, wound issues, motivation    Possible Resolutions to Barriers:  surgical follow up, wound care, family ed    Discharge Planning/Teaching Needs:  Pt plans to return to his mother's home where she, his aunt, and his son/other family will provide 24/7 supervision.  Son to come for family education on day of d/c (11-06-14) prior to pt going home.   Team Discussion:  Pt is emotionally not able/willing to continue with CIR.  He is ready to get home.  Pt is at supervision/mod I overall and it is recommended that pt have [place] at home, however pt does not qualify for  PT/OT at home.  He can have nursing.  CSW encouraged therapists to give pt a home exercise program due to not having home PT/OT.  Pt will need a w/c and has other DME at home.  Revisions to Treatment Plan:  None, besides moving pt's d/c one day earlier.   Continued Need for Acute Rehabilitation Level of Care: The patient requires daily medical management by a physician with specialized training in physical medicine and rehabilitation for the following conditions: Daily direction of a multidisciplinary physical rehabilitation program to ensure safe treatment while eliciting the  highest outcome that is of practical value to the patient.: Yes Daily medical management of patient stability for increased activity during participation in an intensive rehabilitation regime.: Yes Daily analysis of laboratory values and/or radiology reports with any subsequent need for medication adjustment of medical intervention for : Neurological problems;Post surgical problems  Edward Duran, Edward Duran 11/06/2014, 11:25 AM

## 2014-11-06 NOTE — Progress Notes (Signed)
Social Work Patient ID: Edward Duran, male   DOB: [birth year], 59 y.o.   MRN: [id number]   CSW met with pt and his therapists yesterday to discuss the possibility of pt going home a day earlier.  This is pt's desire and therapists feel this would be okay.  CSW spoke with PA, Marlowe Shores, and Dr. Naaman Plummer and they too are in agreement.  CSW called pt's son to update him on the above and he feels comfortable with pt coming home on 11-06-14.  He will plan to be here by 1pm for family education with PT and OT and then d/c instructions from PA prior to d/c.  Pt will have home exercise program only as pt does not qualify for home PT/OT due to diagnosis and insurance coverage (medicaid pending).  Pt will have home health RN for skin monitoring.  Pt needs w/c, but has other home DME.  Update given on team conference discussion and team and pt/family are working toward pt's d/c at this time. CSW did not schedule a primary care appt for pt due to pt needing to f/u at [facility] and with surgeon, but pt can call them if need arises.  CSW remains available as needed.

## 2014-11-06 NOTE — Progress Notes (Signed)
Patient, and patient's son educated on incision care, and signs and symptoms of infection. Patient and patient's son verbalized understanding. Patient given discharge information by Helyn Numbers PA, and all questions answered. All belongings packed by son, and assisted to car via wheelchair by nurse tech. [place]

## 2014-11-06 NOTE — Discharge Instructions (Signed)
Inpatient Rehab Discharge Instructions  Edward Duran Discharge date and time: No discharge date for patient encounter.   Activities/Precautions/ Functional Status: Activity: activity as tolerated Diet: regular diet Wound Care: keep wound clean and dry Functional status:  ___ No restrictions     ___ Walk up steps independently ___ 24/7 supervision/assistance   ___ Walk up steps with assistance ___ Intermittent supervision/assistance  ___ Bathe/dress independently ___ Walk with walker     ___ Bathe/dress with assistance ___ Walk Independently    ___ Shower independently _x__ Walk with assistance    ___ Shower with assistance ___ No alcohol     ___ Return to work/school ________  COMMUNITY REFERRALS UPON DISCHARGE:   Home Health:  RN   Agency:  [facility] Phone:  [phone] Medical Equipment/Items Ordered:  16"x18" lightweight w/c with elevating leg rests and 16"x18" basic cushion  Agency/Supplier:  [facility]       Phone:  [phone]  GENERAL COMMUNITY RESOURCES FOR PATIENT/FAMILY: Support Groups:  Cancer Support Group                               November 26, 2014 1-2pm                              [place] at [facility], [place]                              [phone] Follow up with [facility], as needed.  [phone]  Special Instructions: Follow up Dr Darleen Crocker [facility] 2 weeks [phone]    DRY DRESSING TO BACK INCISION TWICE DAILY AS NEEDED   My questions have been answered and I understand these instructions. I will adhere to these goals and the provided educational materials after my discharge from the hospital.  Patient/Caregiver Signature _______________________________ Date __________  Clinician Signature _______________________________________ Date __________  Please bring this form and your medication list with you to all your follow-up doctor's appointments.

## 2014-11-06 NOTE — Progress Notes (Signed)
[place] PHYSICAL MEDICINE & REHABILITATION     PROGRESS NOTE    Subjective/Complaint  Had a good night. Excited to be going home.  Good appetite. Happy to be nearing discharge   ROS: Pt denies fever, rash/itching, headache, blurred or double vision, nausea, vomiting, abdominal pain, diarrhea, chest pain, palpitations, dysuria, dizziness,    Objective: Vital Signs: Blood pressure 119/63, pulse 92, temperature 97.8 F (36.6 C), temperature source Oral, resp. rate 16, height '5\' 6"'$  (1.676 m), weight 56.1 kg (123 lb 10.9 oz), SpO2 100 %. No results found.  Recent Labs  11/04/14 0548  WBC 8.7  HGB 9.3*  HCT 28.0*  PLT 366   No results for input(s): NA, K, CL, GLUCOSE, BUN, CREATININE, CALCIUM in the last 72 hours.  Invalid input(s): CO CBG (last 3)  No results for input(s): GLUCAP in the last 72 hours.  Wt Readings from Last 3 Encounters:  10/30/14 56.1 kg (123 lb 10.9 oz)  10/23/14 57.6 kg (126 lb 15.8 oz)  09/10/14 58.968 kg (130 lb)    Physical Exam:  Constitutional: He is oriented to person, place, and time.  59 year old male appearing much older than stated age, emaciated HENT: dentition poor Head: Normocephalic.  Eyes: EOM are normal.  Neck: Normal range of motion. Neck supple. No thyromegaly present.  Cardiovascular: Normal rate and regular rhythm.  Respiratory: Effort normal. No wheezes!!. No respiratory distress. good air movement GI: Soft. Bowel sounds are normal. He exhibits no distension.  Neurological: He is alert and oriented to person, place, and time.  UE 4/5 delt,bicep,tricep,wrists,hand;, Bilateral LE: 4hf. 4ke and ad/apf. No sensory findings. Less limited by back pain. Cognitively was intact.  Skin:  Back incision with mild s/s drainage--  at upper aspect still--no change Psych: affect more dynamic, pleasant  Assessment/Plan: 1. Functional deficits secondary to pathological L3 fracture which require 3+ hours per day of  interdisciplinary therapy in a comprehensive inpatient rehab setting. Physiatrist is providing close team supervision and 24 hour management of active medical problems listed below. Physiatrist and rehab team continue to assess barriers to discharge/monitor patient progress toward functional and medical goals.  Function:  Bathing Bathing position   Position: Standing at sink (sit<>stand)  Bathing parts Body parts bathed by patient: Right arm, Left arm, Chest, Abdomen, Front perineal area, Buttocks, Right upper leg, Left upper leg, Right lower leg, Left lower leg Body parts bathed by helper: Right lower leg, Left lower leg, Back  Bathing assist Assist Level: Touching or steadying assistance(Pt > 75%) (during standing)      Upper Body Dressing/Undressing Upper body dressing   What is the patient wearing?: Pull over shirt/dress     Pull over shirt/dress - Perfomed by patient: Thread/unthread right sleeve, Thread/unthread left sleeve, Put head through opening, Pull shirt over trunk       Orthosis activity level: Performed by helper (therapist assisted, encouraged pt to do as much as he could)  Upper body assist Assist Level: Supervision or verbal cues      Lower Body Dressing/Undressing Lower body dressing   What is the patient wearing?: Underwear, Pants, Socks, Shoes Underwear - Performed by patient: Thread/unthread right underwear leg, Thread/unthread left underwear leg, Pull underwear up/down   Pants- Performed by patient: Thread/unthread right pants leg, Thread/unthread left pants leg, Pull pants up/down Pants- Performed by helper: Pull pants up/down, Fasten/unfasten pants Non-skid slipper socks- Performed by patient: Don/doff right sock, Don/doff left sock Non-skid slipper socks- Performed by helper: Don/doff right sock, Don/doff left  sock   Socks - Performed by helper: Don/doff right sock, Don/doff left sock   Shoes - Performed by helper: Don/doff right shoe, Don/doff left  shoe          Lower body assist Assist Level: Touching or steadying assistance (Pt > 75%) (sit<>stand)      Toileting Toileting Toileting activity did not occur: N/A Toileting steps completed by patient: Adjust clothing prior to toileting, Performs perineal hygiene, Adjust clothing after toileting   Toileting Assistive Devices: Grab bar or rail  Toileting assist Assist level: Supervision or verbal cues   Transfers Chair/bed transfer   Chair/bed transfer method: Ambulatory Chair/bed transfer assist level: No Help, no cues, assistive device, takes more than a reasonable amount of time Chair/bed transfer assistive device: Medical sales representative     Max distance: 250 Assist level: No help, No cues, assistive device, takes more than a reasonable amount of time   Wheelchair   Type: Manual Max wheelchair distance: >300 Assist Level: No help, No cues, assistive device, takes more than reasonable amount of time  Cognition Comprehension Comprehension assist level: Follows complex conversation/direction with extra time/assistive device  Expression Expression assist level: Expresses complex ideas: With extra time/assistive device  Social Interaction Social Interaction assist level: Interacts appropriately 90% of the time - Needs monitoring or encouragement for participation or interaction.  Problem Solving Problem solving assist level: Solves complex 90% of the time/cues < 10% of the time  Memory Memory assist level: Recognizes or recalls 90% of the time/requires cueing < 10% of the time    Medical Problem List and Plan: 1. 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   -continues therapies  -pt needs follow up with [facility]. [facility] on discharge 2. DVT Prophylaxis/Anticoagulation: SCDs. LE vascular study no evidence DVT on 9/16 3. Pain Management: Oxycodone as needed. Monitor with increased mobility 4. Acute blood  loss anemia. Follow-up labs personally reviewed---up to 9.6 hgb 5. Neuropsych: This patient is capable of making decisions on his own behalf.  -affect is a little better as we near discharge  -added PRN xanax as well as scheduled QHS to help with sleep/anxiety   6. Skin/Wound Care: Routine skin checks. Needs qd to bid dressing 7. Fluids/Electrolytes/Nutrition: Routine I&O. Labs reviewed---replaced k+ 8. Urinary retention. Continue Flomax.   9. COPD/ Tobacco abuse.    - oxygen  -resumed symbicort per home routine   10. Hypertension. No current antihypertensives medication. Patient on Benicar 20-12 0.5 mg daily prior to admission.   11. Decreased nutritional storage. Continue nutritional supplements and encouarge po. 12. Leukocytosis:    -wbc's at 8.7.  hgb generally stable---  -daily dressing changes. Area clean. Check with NS prior to dc  -afebrile  LOS (Days) 9 A FACE TO FACE EVALUATION WAS PERFORMED  SWARTZ,ZACHARY T 11/06/2014 8:21 AM

## 2014-11-18 DIAGNOSIS — J449 Chronic obstructive pulmonary disease, unspecified: Secondary | ICD-10-CM

## 2014-11-18 DIAGNOSIS — M8458XD Pathological fracture in neoplastic disease, other specified site, subsequent encounter for fracture with routine healing: Secondary | ICD-10-CM

## 2014-11-18 DIAGNOSIS — C7951 Secondary malignant neoplasm of bone: Secondary | ICD-10-CM

## 2014-11-18 DIAGNOSIS — I10 Essential (primary) hypertension: Secondary | ICD-10-CM

## 2014-11-21 ENCOUNTER — Encounter (HOSPITAL_COMMUNITY): Payer: Medicaid Other | Attending: Hematology & Oncology | Admitting: Hematology & Oncology

## 2014-11-21 VITALS — BP 192/94 | HR 108 | Temp 97.8°F | Resp 20 | Wt 112.2 lb

## 2014-11-21 DIAGNOSIS — R0602 Shortness of breath: Secondary | ICD-10-CM

## 2014-11-21 DIAGNOSIS — M545 Low back pain: Secondary | ICD-10-CM

## 2014-11-21 DIAGNOSIS — Z720 Tobacco use: Secondary | ICD-10-CM

## 2014-11-21 DIAGNOSIS — C7951 Secondary malignant neoplasm of bone: Secondary | ICD-10-CM

## 2014-11-21 DIAGNOSIS — C342 Malignant neoplasm of middle lobe, bronchus or lung: Secondary | ICD-10-CM

## 2014-11-21 DIAGNOSIS — E46 Unspecified protein-calorie malnutrition: Secondary | ICD-10-CM

## 2014-11-21 DIAGNOSIS — R634 Abnormal weight loss: Secondary | ICD-10-CM

## 2014-11-21 DIAGNOSIS — Z801 Family history of malignant neoplasm of trachea, bronchus and lung: Secondary | ICD-10-CM

## 2014-11-21 MED ORDER — IPRATROPIUM-ALBUTEROL 0.5-2.5 (3) MG/3ML IN SOLN: 3.0000 mL | Freq: Four times a day (QID) | RESPIRATORY_TRACT | Status: AC

## 2014-11-21 MED ORDER — OXYCODONE HCL 10 MG PO TABS: 10.0000 mg | ORAL_TABLET | ORAL | Status: DC | PRN

## 2014-11-21 MED ORDER — OXYCODONE HCL ER 15 MG PO T12A: 15.0000 mg | EXTENDED_RELEASE_TABLET | Freq: Two times a day (BID) | ORAL | Status: DC

## 2014-11-21 MED ORDER — MORPHINE SULFATE ER 15 MG PO TBCR: 15.0000 mg | EXTENDED_RELEASE_TABLET | Freq: Two times a day (BID) | ORAL | Status: DC

## 2014-11-21 NOTE — Patient Instructions (Signed)
[place] at [facility] Discharge Instructions  RECOMMENDATIONS MADE BY THE CONSULTANT AND ANY TEST RESULTS WILL BE SENT TO YOUR REFERRING PHYSICIAN.  We have given you two prescriptions for pain.  Please call us if you have any questions about how to use them.   We have also given you an order for a nebulizer and duoneb solution for that.   You will be having a CT scan and then returning to [place] in the next couple of weeks.  Please see your schedule for appointments.    Thank you for choosing [place] at [facility] to provide your oncology and hematology care.  To afford each patient quality time with our provider, please arrive at least 15 minutes before your scheduled appointment time.    You need to re-schedule your appointment should you arrive 10 or more minutes late.  We strive to give you quality time with our providers, and arriving late affects you and other patients whose appointments are after yours.  Also, if you no show three or more times for appointments you may be dismissed from the clinic at the providers discretion.     Again, thank you for choosing [facility].  Our hope is that these requests will decrease the amount of time that you wait before being seen by our physicians.       _____________________________________________________________  Should you have questions after your visit to [facility], please contact our office at [phone] between the hours of 8:30 a.m. and 4:30 p.m.  Voicemails left after 4:30 p.m. will not be returned until the following business day.  For prescription refill requests, have your pharmacy contact our office.

## 2014-11-21 NOTE — Progress Notes (Signed)
[place]  PROGRESS NOTE   CHIEF COMPLAINT/PURPOSE OF CONSULTATION: Stage IV NSCLC, metastatic squamous cell carcinoma Laminectomy inferior L2,L3, superior L4 for decompression and tumor resection, L3 corpectomy with anterior fusion L2-L4 using expandable titanium cage, dorsal internal fixation andion with pedicle screws at L1,L2,L4,L5, posterior arthrodesis L1-L2 and L4-L5 Post-operative respiratory failure R middle lobe lung mass with collaspse and consolidation. L3 vertebral body mass status post decompression surgery COPD CT of head on 10/21/14 that showed remote L MCA terriotory infarct, no cancer Squamous cell carcinoma Weighed 130lbs on 09/10/14, 112 lbs on 11/21/14 CBC on 11/04/14 showed anemia.  Hemoglobin on 11/04/14, 9.3  Malnutrition   HISTORY OF PRESENT ILLNESS:  59 year old male who presents with newly diagnosed stage IV non-small cell carcinoma. He presented to the emergency department on 10/21/2014 after having multiple falls over the prior 3-4 weeks. He also noted increasing lower extremity weakness and pain. He had previously been seen in the emergency Department 3 weeks prior with similar complaints but was noted to have a normal neurologic exam. On his second presentation he had hyperreflexia on the left and symptoms of weakness. He was transferred to [facility] for an MRI. MRI showed a pathologic fracture at L3 with extension into the pedicles dorsally into the posterior elements. He had significant epidural tumor invasion.  He underwent surgical repair on 10/23/2014 with Dr. Marland Kitchen and Dr. Sherwood Gambler.  The patient is here today with his son and daughter-in-law.  He states that he feels well and has no problems right now.  He does not know if he "cares to do treatment."  He states he has accepted death if it is his time.  His son and daughter-in-law express that they would like for the patient to be open to treatment options.  Discussed treatment and quality of life  options for the patient with and without treatment.  He states that he does not want to commit to a decision until he has completely thought out his options with his family.  The patient has a weight loss of about 18 lbs since July.  He drinks 2 Ensure daily.  He is SOB.  He has a cough.  Denies coughing up phlegm.  Denies fevers.  He has an inhaler at home he uses.  He notes significant improvement in his back pain but comments that he still has difficulty ambulating long distance.   Living will, health care power of attorney, and DNR status was discussed.  He is up every day and gets dressed. He is not very active but denies spending the day in bed.   SOCIAL HISTORY: He lives with his mother.   Divorced 1 child Smoker, 1 ppd. started at age 60-12. Used to be 3 ppd before hospitalized ETOH, sporadic, last case 15 years ago  FAMILY HISTORY: 1 younger brother, healthy.  1 sister deceased Father deceased, lung cancer, 26 Mother is living and "fairly independent and healthy"   ROS:  14 point review of systems was performed and is negative except as detailed under history of present illness above. Review of Systems  Constitutional: Positive for weight loss and malaise/fatigue. Negative for fever, chills and diaphoresis.  HENT: Negative for congestion, ear discharge, ear pain, hearing loss, nosebleeds, sore throat and tinnitus.   Eyes: Negative.   Respiratory: Positive for hemoptysis and wheezing. Negative for cough, sputum production, shortness of breath and stridor.   Cardiovascular: Negative.   Gastrointestinal: Negative.   Genitourinary: Positive for frequency. Negative for dysuria, urgency,  hematuria and flank pain.  Musculoskeletal: Positive for back pain and falls. Negative for myalgias, joint pain and neck pain.  Skin: Negative.   Neurological: Positive for focal weakness and weakness. Negative for dizziness, tingling, tremors, sensory change, speech change, seizures, loss of  consciousness and headaches.  Endo/Heme/Allergies: Negative.   Psychiatric/Behavioral: Negative.     PHYSICAL EXAMINATION: ECOG PERFORMANCE STATUS: 2 - Symptomatic, <50% confined to bed   Filed Vitals:   11/21/14 1438  BP: 192/94  Pulse: 108  Temp: 97.8 F (36.6 C)  Resp: 20   Filed Weights   11/21/14 1438  Weight: 112 lb 3.2 oz (50.894 kg)    GENERAL:alert, no distress and comfortable SKIN: skin color, texture, turgor are normal, no rashes or significant lesions EYES: normal, Conjunctiva are pink and non-injected, sclera clear OROPHARYNX:no exudate, no erythema and lips, buccal mucosa, and tongue normal  NECK: supple, thyroid normal size, non-tender, without nodularity LYMPH:  no palpable lymphadenopathy in the cervical, axillary or inguinal LUNGS: Diminished breath sounds both lung bases HEART: regular rate & rhythm and no murmurs and no lower extremity edema ABDOMEN:abdomen soft, non-tender and normal bowel sounds Musculoskeletal:no cyanosis of digits and no clubbing  NEURO: Moves all extremities, has sensation in both his feet   PATHOLOGY:   LABORATORY DATA:  I have reviewed the data as listed CMP Latest Ref Rng 10/29/2014 10/27/2014 10/26/2014  Glucose 65 - 99 mg/dL 120(H) 117(H) 115(H)  BUN 6 - 20 mg/dL '13 10 10  '$ Creatinine 0.61 - 1.24 mg/dL 1.09 1.12 1.04  Sodium 135 - 145 mmol/L 133(L) 135 132(L)  Potassium 3.5 - 5.1 mmol/L 3.4(L) 3.5 3.2(L)  Chloride 101 - 111 mmol/L 102 104 102  CO2 22 - 32 mmol/L '24 24 23  '$ Calcium 8.9 - 10.3 mg/dL 8.0(L) 7.8(L) 7.6(L)  Total Protein 6.5 - 8.1 g/dL 4.8(L) - -  Total Bilirubin 0.3 - 1.2 mg/dL 0.7 - -  Alkaline Phos 38 - 126 U/L 70 - -  AST 15 - 41 U/L 23 - -  ALT 17 - 63 U/L 24 - -    Lab Results  Component Value Date   WBC 8.7 11/04/2014   HGB 9.3* 11/04/2014   HCT 28.0* 11/04/2014   MCV 87.8 11/04/2014   PLT 366 11/04/2014   NEUTROABS 9.0* 10/29/2014    ASSESSMENT AND PLAN: Stage IV NSCLC, Right middle lobe  Lung mass with collapse and consolidation accompanied by large L3 vertebral body mass status post decompression surgery.Final pathology squamous cell carcinoma Weight loss, malnutrition Pain, improved History of L MCA infarct Tobacco Abuse PS2  We spent time in discussion regarding non-small cell lung cancer, the differences between squamous cell and adenocarcinoma. We discussed staging and prognosis. I advised the patient and his family that he has stage IV disease, this is incurable. They noted they understood what that meant. They had questions regarding the patient's ability to tolerate treatment and questions regarding benefit from treatment I advised them that there is clear data patient's to benefit from therapy if there performance status is adequate. Currently the patient's performance status is a 2.  I have recommended a nebulizer with albuterol to use at home several times daily. This should improve some of his shortness of breath. We discussed pain control and he is very reluctant to use pain medication. He has been using short acting oxycodone and notes that they only last about "2 hours." He would like better pain relief as he feels this would further improve his functional  status. We have started him on low-dose long-acting morphine. I have explained long-acting pain medication and breakthrough pain medication.  We discussed options for therapy. We discussed side effects of therapy. Realistically most patients do well with the Gemzar/carboplatin. We also discussed second line immunotherapy. We will send PDL 1 testing. Discussed the risks of treatment including nausea, vomiting, fever, increase risk of infection, blood count abnormalities.  The patient and family would like to discuss these things further and will notify us should he decide to do therapy.  This document serves as a record of services personally performed by Ancil Linsey, MD. It was created on her behalf by Janace Hoard, a trained medical scribe. The creation of this record is based on the scribe's personal observations and the provider's statements to them. This document has been checked and approved by the attending provider.  I have reviewed the above documentation for accuracy and completeness, and I agree with the above.  This note was electronically signed.  Kelby Fam. Whitney Muse, MD

## 2014-11-26 ENCOUNTER — Encounter (HOSPITAL_COMMUNITY): Payer: Self-pay | Admitting: Hematology & Oncology

## 2014-11-27 ENCOUNTER — Ambulatory Visit (HOSPITAL_COMMUNITY)
Admission: RE | Admit: 2014-11-27 | Discharge: 2014-11-27 | Disposition: A | Discharge status: Home / Self Care | Payer: Medicaid Other | Source: Ambulatory Visit | Attending: Hematology & Oncology | Admitting: Hematology & Oncology

## 2014-11-27 DIAGNOSIS — C7951 Secondary malignant neoplasm of bone: Secondary | ICD-10-CM | POA: Diagnosis not present

## 2014-11-27 DIAGNOSIS — C3490 Malignant neoplasm of unspecified part of unspecified bronchus or lung: Secondary | ICD-10-CM | POA: Diagnosis not present

## 2014-11-27 MED ORDER — IOHEXOL 300 MG/ML  SOLN
75.0000 mL | Freq: Once | INTRAMUSCULAR | Status: AC | PRN
  Administered 2014-11-27: 75 mL via INTRAVENOUS

## 2014-11-28 ENCOUNTER — Inpatient Hospital Stay (HOSPITAL_COMMUNITY): Payer: Self-pay

## 2014-12-02 ENCOUNTER — Other Ambulatory Visit (HOSPITAL_COMMUNITY): Payer: Self-pay | Admitting: Hematology & Oncology

## 2014-12-02 DIAGNOSIS — C3490 Malignant neoplasm of unspecified part of unspecified bronchus or lung: Secondary | ICD-10-CM | POA: Insufficient documentation

## 2014-12-02 DIAGNOSIS — C3491 Malignant neoplasm of unspecified part of right bronchus or lung: Secondary | ICD-10-CM

## 2014-12-02 HISTORY — DX: Malignant neoplasm of unspecified part of unspecified bronchus or lung: C34.90

## 2014-12-03 MED ORDER — ONDANSETRON HCL 8 MG PO TABS: 8.0000 mg | ORAL_TABLET | Freq: Three times a day (TID) | ORAL | Status: AC | PRN

## 2014-12-03 NOTE — Patient Instructions (Addendum)
[place]   CHEMOTHERAPY INSTRUCTIONS  Premeds: Aloxi - for nausea prevention/reduction and Dexamethasone - steroid - given to also decrease nausea/vomiting. Dex can cause you to feel energized, nervous/anxious/jittery, make you have trouble sleeping, and/or make you feel hot/flushed in the face/neck and/or look pink/red in the face/neck. These side effects will pass as the Dex wears off. (takes 20 minutes to infuse)  Carboplatin - this medication can be hard on your kidneys - this is why we need you to drink 64 oz of fluid (preferably water/decaff fluids) 2 days prior to chemo and for up to 4-5 days after chemo. Drink more if you can. This will help to keep your kidneys flushed. This can cause mild hair loss, lower your platelets (which keep you from bleeding out when you cut yourself), lower your white blood cells (fight infection), and cause nausea/vomiting. (only takes 30 minutes to infuse) (you will receive this drug on Days 1 & 8 every 21 days)  Gemcitabine - bone marrow suppression (lowers white blood cells (fight infection), lowers red blood cells (make up your blood), lowers platelets (help blood to clot). Nausea/vomiting,fever, flu-like symptoms, rash. (only takes 30 minutes to infuse)  (you will receive this drug on Days 1 & 8 every 21 days)   POTENTIAL SIDE EFFECTS OF TREATMENT: Increased Susceptibility to Infection, Nausea/Vomiting, Constipation/Diarrhea, Hair Thinning, Changes in Character of Skin and Nails (brittleness, dryness,etc.), Bone Marrow Suppression, Mouth Sores   EDUCATIONAL MATERIALS GIVEN AND REVIEWED: Chemotherapy and You booklet Specific Instructions Sheets: Carboplatin, Gemzar, Aloxi, Dexamethasone, Zofran   SELF CARE ACTIVITIES WHILE ON CHEMOTHERAPY: Increase your fluid intake 48 hours prior to treatment and drink at least 2 quarts per day after treatment., No alcohol intake., No aspirin or other medications unless approved by  your oncologist., Eat foods that are light and easy to digest., Eat foods at cold or room temperature., No fried, fatty, or spicy foods immediately before or after treatment., Have teeth cleaned professionally before starting treatment. Keep dentures and partial plates clean., Use soft toothbrush and do not use mouthwashes that contain alcohol. Biotene is a good mouthwash that is available at most pharmacies or may be ordered by calling [phone]., Use warm salt water gargles (1 teaspoon salt per 1 quart warm water) before and after meals and at bedtime. Or you may rinse with 2 tablespoons of three -percent hydrogen peroxide mixed in eight ounces of water., Always use sunscreen with SPF (Sun Protection Factor) of 30 or higher., Use your nausea medication as directed to prevent nausea., Use your stool softener or laxative as directed to prevent constipation. and Use your anti-diarrheal medication as directed to stop diarrhea.  Please wash your hands for at least 30 seconds using warm soapy water. Handwashing is the #1 way to prevent the spread of germs. Stay away from sick people or people who are getting over a cold. If you develop respiratory systems such as green/yellow mucus production or productive cough or persistent cough let us know and we will see if you need an antibiotic. It is a good idea to keep a pair of gloves on when going into grocery stores/[employer] to decrease your risk of coming into contact with germs on the carts, etc. Carry alcohol hand gel with you at all times and use it frequently if out in public. All foods need to be cooked thoroughly. No raw foods. No medium or undercooked meats, eggs. If your food is cooked medium well, it does not  need to be hot pink or saturated with bloody liquid at all. Vegetables and fruits need to be washed/rinsed under the faucet with a dish detergent before being consumed. You can eat raw fruits and vegetables unless we tell you otherwise but it would be  best if you cooked them or bought frozen. Do not eat off of salad bars or hot bars unless you really trust the cleanliness of the restaurant. If you need dental work, please let Dr. Whitney Muse know before you go for your appointment so that we can coordinate the best possible time for you in regards to your chemo regimen. You need to also let your dentist know that you are actively taking chemo. We may need to do labs prior to your dental appointment. We also want your bowels moving at least every other day. If this is not happening, we need to know so that we can get you on a bowel regimen to help you go.    MEDICATIONS: You have been given prescriptions for the following medications:  Zofran '8mg'$  tablet. Take 1 tablet every 8 hours as needed for nausea/vomiting.    Over-the-Counter Meds:  Miralax 1 capful in 8 oz of fluid daily. May increase to two times a day if needed. This is a stool softener. If this doesn't work proceed you can add:  Senokot S  - start with 1 tablet two times a day and increase to 4 tablets two times a day if needed. (total of 8 tablets in a 24 hour period). This is a stimulant laxative.   Call us if this does not help your bowels move.   Imodium '2mg'$  capsule. Take 2 capsules after the 1st loose stool and then 1 capsule every 2 hours until you go a total of 12 hours without having a loose stool. Call the [facility] if loose stools continue.   SYMPTOMS TO REPORT AS SOON AS POSSIBLE AFTER TREATMENT:  FEVER GREATER THAN 100.5 F  CHILLS WITH OR WITHOUT FEVER  NAUSEA AND VOMITING THAT IS NOT CONTROLLED WITH YOUR NAUSEA MEDICATION  UNUSUAL SHORTNESS OF BREATH  UNUSUAL BRUISING OR BLEEDING  TENDERNESS IN MOUTH AND THROAT WITH OR WITHOUT PRESENCE OF ULCERS  URINARY PROBLEMS  BOWEL PROBLEMS  UNUSUAL RASH    Wear comfortable clothing and clothing appropriate for easy access to any Portacath or PICC line. Let us know if there is anything that we can do to make your  therapy better!      I have been informed and understand all of the instructions given to me and have received a copy. I have been instructed to call the clinic [phone] or my family physician as soon as possible for continued medical care, if indicated. I do not have any more questions at this time but understand that I may call the [facility] or the Patient Navigator at [phone] during office hours should I have questions or need assistance in obtaining follow-up care.            Palonosetron Injection What is this medicine? PALONOSETRON (pal oh NOE se tron) is used to prevent nausea and vomiting caused by chemotherapy. It also helps prevent delayed nausea and vomiting that may occur a few days after your treatment. This medicine may be used for other purposes; ask your health care provider or pharmacist if you have questions. What should I tell my health care provider before I take this medicine? They need to know if you have any of these  conditions: -an unusual or allergic reaction to palonosetron, dolasetron, granisetron, ondansetron, other medicines, foods, dyes, or preservatives -pregnant or trying to get pregnant -breast-feeding How should I use this medicine? This medicine is for infusion into a vein. It is given by a health care professional in a hospital or clinic setting. Talk to your pediatrician regarding the use of this medicine in children. While this drug may be prescribed for children as young as 1 month for selected conditions, precautions do apply. Overdosage: If you think you have taken too much of this medicine contact a poison control center or emergency room at once. NOTE: This medicine is only for you. Do not share this medicine with others. What if I miss a dose? This does not apply. What may interact with this medicine? -certain medicines for depression, anxiety, or psychotic disturbances -fentanyl -linezolid -MAOIs like Carbex,  Eldepryl, Marplan, Nardil, and Parnate -methylene blue (injected into a vein) -tramadol This list may not describe all possible interactions. Give your health care provider a list of all the medicines, herbs, non-prescription drugs, or dietary supplements you use. Also tell them if you smoke, drink alcohol, or use illegal drugs. Some items may interact with your medicine. What should I watch for while using this medicine? Your condition will be monitored carefully while you are receiving this medicine. What side effects may I notice from receiving this medicine? Side effects that you should report to your doctor or health care professional as soon as possible: -allergic reactions like skin rash, itching or hives, swelling of the face, lips, or tongue -breathing problems -confusion -dizziness -fast, irregular heartbeat -fever and chills -loss of balance or coordination -seizures -sweating -swelling of the hands and feet -tremors -unusually weak or tired Side effects that usually do not require medical attention (report to your doctor or health care professional if they continue or are bothersome): -constipation or diarrhea -headache This list may not describe all possible side effects. Call your doctor for medical advice about side effects. You may report side effects to FDA at 1-800-FDA-1088. Where should I keep my medicine? This drug is given in a hospital or clinic and will not be stored at home. NOTE: This sheet is a summary. It may not cover all possible information. If you have questions about this medicine, talk to your doctor, pharmacist, or health care provider.    2016, Elsevier/Gold Standard. (2012-12-08 10:38:36) Dexamethasone injection What is this medicine? DEXAMETHASONE (dex a METH a sone) is a corticosteroid. It is used to treat inflammation of the skin, joints, lungs, and other organs. Common conditions treated include asthma, allergies, and arthritis. It is also used  for other conditions, like blood disorders and diseases of the adrenal glands. This medicine may be used for other purposes; ask your health care provider or pharmacist if you have questions. What should I tell my health care provider before I take this medicine? They need to know if you have any of these conditions: -blood clotting problems -Cushing's syndrome -diabetes -glaucoma -heart problems or disease -high blood pressure -infection like herpes, measles, tuberculosis, or chickenpox -kidney disease -liver disease -mental problems -myasthenia gravis -osteoporosis -previous heart attack -seizures -stomach, ulcer or intestine disease including colitis and diverticulitis -thyroid problem -an unusual or allergic reaction to dexamethasone, corticosteroids, other medicines, lactose, foods, dyes, or preservatives -pregnant or trying to get pregnant -breast-feeding How should I use this medicine? This medicine is for injection into a muscle, joint, lesion, soft tissue, or vein. It is given by a  health care professional in a hospital or clinic setting. Talk to your pediatrician regarding the use of this medicine in children. Special care may be needed. Overdosage: If you think you have taken too much of this medicine contact a poison control center or emergency room at once. NOTE: This medicine is only for you. Do not share this medicine with others. What if I miss a dose? This may not apply. If you are having a series of injections over a prolonged period, try not to miss an appointment. Call your doctor or health care professional to reschedule if you are unable to keep an appointment. What may interact with this medicine? Do not take this medicine with any of the following medications: -mifepristone, RU-486 -vaccines This medicine may also interact with the following medications: -amphotericin B -antibiotics like clarithromycin, erythromycin, and troleandomycin -aspirin and  aspirin-like drugs -barbiturates like phenobarbital -carbamazepine -cholestyramine -cholinesterase inhibitors like donepezil, galantamine, rivastigmine, and tacrine -cyclosporine -digoxin -diuretics -ephedrine -male hormones, like estrogens or progestins and birth control pills -indinavir -isoniazid -ketoconazole -medicines for diabetes -medicines that improve muscle tone or strength for conditions like myasthenia gravis -NSAIDs, medicines for pain and inflammation, like ibuprofen or naproxen -phenytoin -rifampin -thalidomide -warfarin This list may not describe all possible interactions. Give your health care provider a list of all the medicines, herbs, non-prescription drugs, or dietary supplements you use. Also tell them if you smoke, drink alcohol, or use illegal drugs. Some items may interact with your medicine. What should I watch for while using this medicine? Your condition will be monitored carefully while you are receiving this medicine. If you are taking this medicine for a long time, carry an identification card with your name and address, the type and dose of your medicine, and your doctor's name and address. This medicine may increase your risk of getting an infection. Stay away from people who are sick. Tell your doctor or health care professional if you are around anyone with measles or chickenpox. Talk to your health care provider before you get any vaccines that you take this medicine. If you are going to have surgery, tell your doctor or health care professional that you have taken this medicine within the last twelve months. Ask your doctor or health care professional about your diet. You may need to lower the amount of salt you eat. The medicine can increase your blood sugar. If you are a diabetic check with your doctor if you need help adjusting the dose of your diabetic medicine. What side effects may I notice from receiving this medicine? Side effects that you  should report to your doctor or health care professional as soon as possible: -allergic reactions like skin rash, itching or hives, swelling of the face, lips, or tongue -black or tarry stools -change in the amount of urine -changes in vision -confusion, excitement, restlessness, a false sense of well-being -fever, sore throat, sneezing, cough, or other signs of infection, wounds that will not heal -hallucinations -increased thirst -mental depression, mood swings, mistaken feelings of self importance or of being mistreated -pain in hips, back, ribs, arms, shoulders, or legs -pain, redness, or irritation at the injection site -redness, blistering, peeling or loosening of the skin, including inside the mouth -rounding out of face -swelling of feet or lower legs -unusual bleeding or bruising -unusual tired or weak -wounds that do not heal Side effects that usually do not require medical attention (report to your doctor or health care professional if they continue or are bothersome): -diarrhea  or constipation -change in taste -headache -nausea, vomiting -skin problems, acne, thin and shiny skin -touble sleeping -unusual growth of hair on the face or body -weight gain This list may not describe all possible side effects. Call your doctor for medical advice about side effects. You may report side effects to FDA at 1-800-FDA-1088. Where should I keep my medicine? This drug is given in a hospital or clinic and will not be stored at home. NOTE: This sheet is a summary. It may not cover all possible information. If you have questions about this medicine, talk to your doctor, pharmacist, or health care provider.    2016, Elsevier/Gold Standard. (2007-05-25 14:04:12) Carboplatin injection What is this medicine? CARBOPLATIN (KAR boe pla tin) is a chemotherapy drug. It targets fast dividing cells, like cancer cells, and causes these cells to die. This medicine is used to treat ovarian cancer  and many other cancers. This medicine may be used for other purposes; ask your health care provider or pharmacist if you have questions. What should I tell my health care provider before I take this medicine? They need to know if you have any of these conditions: -blood disorders -hearing problems -kidney disease -recent or ongoing radiation therapy -an unusual or allergic reaction to carboplatin, cisplatin, other chemotherapy, other medicines, foods, dyes, or preservatives -pregnant or trying to get pregnant -breast-feeding How should I use this medicine? This drug is usually given as an infusion into a vein. It is administered in a hospital or clinic by a specially trained health care professional. Talk to your pediatrician regarding the use of this medicine in children. Special care may be needed. Overdosage: If you think you have taken too much of this medicine contact a poison control center or emergency room at once. NOTE: This medicine is only for you. Do not share this medicine with others. What if I miss a dose? It is important not to miss a dose. Call your doctor or health care professional if you are unable to keep an appointment. What may interact with this medicine? -medicines for seizures -medicines to increase blood counts like filgrastim, pegfilgrastim, sargramostim -some antibiotics like amikacin, gentamicin, neomycin, streptomycin, tobramycin -vaccines Talk to your doctor or health care professional before taking any of these medicines: -acetaminophen -aspirin -ibuprofen -ketoprofen -naproxen This list may not describe all possible interactions. Give your health care provider a list of all the medicines, herbs, non-prescription drugs, or dietary supplements you use. Also tell them if you smoke, drink alcohol, or use illegal drugs. Some items may interact with your medicine. What should I watch for while using this medicine? Your condition will be monitored carefully  while you are receiving this medicine. You will need important blood work done while you are taking this medicine. This drug may make you feel generally unwell. This is not uncommon, as chemotherapy can affect healthy cells as well as cancer cells. Report any side effects. Continue your course of treatment even though you feel ill unless your doctor tells you to stop. In some cases, you may be given additional medicines to help with side effects. Follow all directions for their use. Call your doctor or health care professional for advice if you get a fever, chills or sore throat, or other symptoms of a cold or flu. Do not treat yourself. This drug decreases your body's ability to fight infections. Try to avoid being around people who are sick. This medicine may increase your risk to bruise or bleed. Call your doctor or  health care professional if you notice any unusual bleeding. Be careful brushing and flossing your teeth or using a toothpick because you may get an infection or bleed more easily. If you have any dental work done, tell your dentist you are receiving this medicine. Avoid taking products that contain aspirin, acetaminophen, ibuprofen, naproxen, or ketoprofen unless instructed by your doctor. These medicines may hide a fever. Do not become pregnant while taking this medicine. Women should inform their doctor if they wish to become pregnant or think they might be pregnant. There is a potential for serious side effects to an unborn child. Talk to your health care professional or pharmacist for more information. Do not breast-feed an infant while taking this medicine. What side effects may I notice from receiving this medicine? Side effects that you should report to your doctor or health care professional as soon as possible: -allergic reactions like skin rash, itching or hives, swelling of the face, lips, or tongue -signs of infection - fever or chills, cough, sore throat, pain or difficulty  passing urine -signs of decreased platelets or bleeding - bruising, pinpoint red spots on the skin, black, tarry stools, nosebleeds -signs of decreased red blood cells - unusually weak or tired, fainting spells, lightheadedness -breathing problems -changes in hearing -changes in vision -chest pain -high blood pressure -low blood counts - This drug may decrease the number of white blood cells, red blood cells and platelets. You may be at increased risk for infections and bleeding. -nausea and vomiting -pain, swelling, redness or irritation at the injection site -pain, tingling, numbness in the hands or feet -problems with balance, talking, walking -trouble passing urine or change in the amount of urine Side effects that usually do not require medical attention (report to your doctor or health care professional if they continue or are bothersome): -hair loss -loss of appetite -metallic taste in the mouth or changes in taste This list may not describe all possible side effects. Call your doctor for medical advice about side effects. You may report side effects to FDA at 1-800-FDA-1088. Where should I keep my medicine? This drug is given in a hospital or clinic and will not be stored at home. NOTE: This sheet is a summary. It may not cover all possible information. If you have questions about this medicine, talk to your doctor, pharmacist, or health care provider.    2016, Elsevier/Gold Standard. (2007-05-09 14:38:05) Gemcitabine injection What is this medicine? GEMCITABINE (jem SIT a been) is a chemotherapy drug. This medicine is used to treat many types of cancer like breast cancer, lung cancer, pancreatic cancer, and ovarian cancer. This medicine may be used for other purposes; ask your health care provider or pharmacist if you have questions. What should I tell my health care provider before I take this medicine? They need to know if you have any of these conditions: -blood  disorders -infection -kidney disease -liver disease -recent or ongoing radiation therapy -an unusual or allergic reaction to gemcitabine, other chemotherapy, other medicines, foods, dyes, or preservatives -pregnant or trying to get pregnant -breast-feeding How should I use this medicine? This drug is given as an infusion into a vein. It is administered in a hospital or clinic by a specially trained health care professional. Talk to your pediatrician regarding the use of this medicine in children. Special care may be needed. Overdosage: If you think you have taken too much of this medicine contact a poison control center or emergency room at once. NOTE: This medicine  is only for you. Do not share this medicine with others. What if I miss a dose? It is important not to miss your dose. Call your doctor or health care professional if you are unable to keep an appointment. What may interact with this medicine? -medicines to increase blood counts like filgrastim, pegfilgrastim, sargramostim -some other chemotherapy drugs like cisplatin -vaccines Talk to your doctor or health care professional before taking any of these medicines: -acetaminophen -aspirin -ibuprofen -ketoprofen -naproxen This list may not describe all possible interactions. Give your health care provider a list of all the medicines, herbs, non-prescription drugs, or dietary supplements you use. Also tell them if you smoke, drink alcohol, or use illegal drugs. Some items may interact with your medicine. What should I watch for while using this medicine? Visit your doctor for checks on your progress. This drug may make you feel generally unwell. This is not uncommon, as chemotherapy can affect healthy cells as well as cancer cells. Report any side effects. Continue your course of treatment even though you feel ill unless your doctor tells you to stop. In some cases, you may be given additional medicines to help with side effects.  Follow all directions for their use. Call your doctor or health care professional for advice if you get a fever, chills or sore throat, or other symptoms of a cold or flu. Do not treat yourself. This drug decreases your body's ability to fight infections. Try to avoid being around people who are sick. This medicine may increase your risk to bruise or bleed. Call your doctor or health care professional if you notice any unusual bleeding. Be careful brushing and flossing your teeth or using a toothpick because you may get an infection or bleed more easily. If you have any dental work done, tell your dentist you are receiving this medicine. Avoid taking products that contain aspirin, acetaminophen, ibuprofen, naproxen, or ketoprofen unless instructed by your doctor. These medicines may hide a fever. Women should inform their doctor if they wish to become pregnant or think they might be pregnant. There is a potential for serious side effects to an unborn child. Talk to your health care professional or pharmacist for more information. Do not breast-feed an infant while taking this medicine. What side effects may I notice from receiving this medicine? Side effects that you should report to your doctor or health care professional as soon as possible: -allergic reactions like skin rash, itching or hives, swelling of the face, lips, or tongue -low blood counts - this medicine may decrease the number of white blood cells, red blood cells and platelets. You may be at increased risk for infections and bleeding. -signs of infection - fever or chills, cough, sore throat, pain or difficulty passing urine -signs of decreased platelets or bleeding - bruising, pinpoint red spots on the skin, black, tarry stools, blood in the urine -signs of decreased red blood cells - unusually weak or tired, fainting spells, lightheadedness -breathing problems -chest pain -mouth sores -nausea and vomiting -pain, swelling, redness at  site where injected -pain, tingling, numbness in the hands or feet -stomach pain -swelling of ankles, feet, hands -unusual bleeding Side effects that usually do not require medical attention (report to your doctor or health care professional if they continue or are bothersome): -constipation -diarrhea -hair loss -loss of appetite -stomach upset This list may not describe all possible side effects. Call your doctor for medical advice about side effects. You may report side effects to FDA at  1-800-FDA-1088. Where should I keep my medicine? This drug is given in a hospital or clinic and will not be stored at home. NOTE: This sheet is a summary. It may not cover all possible information. If you have questions about this medicine, talk to your doctor, pharmacist, or health care provider.    2016, Elsevier/Gold Standard. (2007-06-13 18:45:54) Ondansetron tablets What is this medicine? ONDANSETRON (on DAN se tron) is used to treat nausea and vomiting caused by chemotherapy. It is also used to prevent or treat nausea and vomiting after surgery. This medicine may be used for other purposes; ask your health care provider or pharmacist if you have questions. What should I tell my health care provider before I take this medicine? They need to know if you have any of these conditions: -heart disease -history of irregular heartbeat -liver disease -low levels of magnesium or potassium in the blood -an unusual or allergic reaction to ondansetron, granisetron, other medicines, foods, dyes, or preservatives -pregnant or trying to get pregnant -breast-feeding How should I use this medicine? Take this medicine by mouth with a glass of water. Follow the directions on your prescription label. Take your doses at regular intervals. Do not take your medicine more often than directed. Talk to your pediatrician regarding the use of this medicine in children. Special care may be needed. Overdosage: If you  think you have taken too much of this medicine contact a poison control center or emergency room at once. NOTE: This medicine is only for you. Do not share this medicine with others. What if I miss a dose? If you miss a dose, take it as soon as you can. If it is almost time for your next dose, take only that dose. Do not take double or extra doses. What may interact with this medicine? Do not take this medicine with any of the following medications: -apomorphine -certain medicines for fungal infections like fluconazole, itraconazole, ketoconazole, posaconazole, voriconazole -cisapride -dofetilide -dronedarone -pimozide -thioridazine -ziprasidone This medicine may also interact with the following medications: -carbamazepine -certain medicines for depression, anxiety, or psychotic disturbances -fentanyl -linezolid -MAOIs like Carbex, Eldepryl, Marplan, Nardil, and Parnate -methylene blue (injected into a vein) -other medicines that prolong the QT interval (cause an abnormal heart rhythm) -phenytoin -rifampicin -tramadol This list may not describe all possible interactions. Give your health care provider a list of all the medicines, herbs, non-prescription drugs, or dietary supplements you use. Also tell them if you smoke, drink alcohol, or use illegal drugs. Some items may interact with your medicine. What should I watch for while using this medicine? Check with your doctor or health care professional right away if you have any sign of an allergic reaction. What side effects may I notice from receiving this medicine? Side effects that you should report to your doctor or health care professional as soon as possible: -allergic reactions like skin rash, itching or hives, swelling of the face, lips or tongue -breathing problems -confusion -dizziness -fast or irregular heartbeat -feeling faint or lightheaded, falls -fever and chills -loss of balance or  coordination -seizures -sweating -swelling of the hands or feet -tightness in the chest -tremors -unusually weak or tired Side effects that usually do not require medical attention (report to your doctor or health care professional if they continue or are bothersome): -constipation or diarrhea -headache This list may not describe all possible side effects. Call your doctor for medical advice about side effects. You may report side effects to FDA at 1-800-FDA-1088. Where should  I keep my medicine? Keep out of the reach of children. Store between 2 and 30 degrees C (36 and 86 degrees F). Throw away any unused medicine after the expiration date. NOTE: This sheet is a summary. It may not cover all possible information. If you have questions about this medicine, talk to your doctor, pharmacist, or health care provider.    2016, Elsevier/Gold Standard. (2012-11-08 16:27:45)

## 2014-12-05 ENCOUNTER — Encounter (HOSPITAL_BASED_OUTPATIENT_CLINIC_OR_DEPARTMENT_OTHER): Payer: Medicaid Other | Admitting: Hematology & Oncology

## 2014-12-05 ENCOUNTER — Encounter: Payer: Self-pay | Admitting: Dietician

## 2014-12-05 ENCOUNTER — Encounter (HOSPITAL_COMMUNITY): Payer: Self-pay | Admitting: Hematology & Oncology

## 2014-12-05 ENCOUNTER — Encounter (HOSPITAL_COMMUNITY): Payer: Medicaid Other

## 2014-12-05 VITALS — BP 169/75 | HR 87 | Temp 98.0°F | Resp 16

## 2014-12-05 VITALS — BP 187/93 | HR 102 | Temp 98.4°F | Resp 16 | Wt 107.9 lb

## 2014-12-05 DIAGNOSIS — Z720 Tobacco use: Secondary | ICD-10-CM

## 2014-12-05 DIAGNOSIS — Z5111 Encounter for antineoplastic chemotherapy: Secondary | ICD-10-CM

## 2014-12-05 DIAGNOSIS — C342 Malignant neoplasm of middle lobe, bronchus or lung: Secondary | ICD-10-CM | POA: Diagnosis not present

## 2014-12-05 DIAGNOSIS — C3490 Malignant neoplasm of unspecified part of unspecified bronchus or lung: Secondary | ICD-10-CM

## 2014-12-05 DIAGNOSIS — G893 Neoplasm related pain (acute) (chronic): Secondary | ICD-10-CM

## 2014-12-05 DIAGNOSIS — C7951 Secondary malignant neoplasm of bone: Secondary | ICD-10-CM | POA: Diagnosis not present

## 2014-12-05 DIAGNOSIS — C3491 Malignant neoplasm of unspecified part of right bronchus or lung: Secondary | ICD-10-CM

## 2014-12-05 DIAGNOSIS — R634 Abnormal weight loss: Secondary | ICD-10-CM | POA: Diagnosis not present

## 2014-12-05 DIAGNOSIS — M8458XA Pathological fracture in neoplastic disease, other specified site, initial encounter for fracture: Secondary | ICD-10-CM

## 2014-12-05 LAB — COMPREHENSIVE METABOLIC PANEL
ALBUMIN: 3.4 g/dL — AB (ref 3.5–5.0)
ALT: 11 U/L — ABNORMAL LOW (ref 17–63)
AST: 20 U/L (ref 15–41)
Alkaline Phosphatase: 134 U/L — ABNORMAL HIGH (ref 38–126)
Anion gap: 9 (ref 5–15)
BUN: 10 mg/dL (ref 6–20)
CHLORIDE: 99 mmol/L — AB (ref 101–111)
CO2: 27 mmol/L (ref 22–32)
Calcium: 8.9 mg/dL (ref 8.9–10.3)
Creatinine, Ser: 1.12 mg/dL (ref 0.61–1.24)
GFR calc Af Amer: >60 mL/min (ref >60)
GFR calc non Af Amer: >60 mL/min (ref >60)
GLUCOSE: 133 mg/dL — AB (ref 65–99)
POTASSIUM: 3.2 mmol/L — AB (ref 3.5–5.1)
SODIUM: 135 mmol/L (ref 135–145)
Total Bilirubin: 0.7 mg/dL (ref 0.3–1.2)
Total Protein: 7 g/dL (ref 6.5–8.1)

## 2014-12-05 LAB — CBC WITH DIFFERENTIAL/PLATELET
BASOS ABS: 0.1 10*3/uL (ref 0.0–0.1)
BASOS PCT: 1 %
EOS PCT: 7 %
Eosinophils Absolute: 0.9 10*3/uL — ABNORMAL HIGH (ref 0.0–0.7)
HCT: 38.9 % — ABNORMAL LOW (ref 39.0–52.0)
Hemoglobin: 13.2 g/dL (ref 13.0–17.0)
Lymphocytes Relative: 12 %
Lymphs Abs: 1.5 10*3/uL (ref 0.7–4.0)
MCH: 30.2 pg (ref 26.0–34.0)
MCHC: 33.9 g/dL (ref 30.0–36.0)
MCV: 89 fL (ref 78.0–100.0)
MONO ABS: 1 10*3/uL (ref 0.1–1.0)
Monocytes Relative: 8 %
NEUTROS ABS: 8.8 10*3/uL — AB (ref 1.7–7.7)
Neutrophils Relative %: 72 %
PLATELETS: 328 10*3/uL (ref 150–400)
RBC: 4.37 MIL/uL (ref 4.22–5.81)
RDW: 15.5 % (ref 11.5–15.5)
WBC: 12.2 10*3/uL — ABNORMAL HIGH (ref 4.0–10.5)

## 2014-12-05 MED ORDER — DEXAMETHASONE SODIUM PHOSPHATE 100 MG/10ML IJ SOLN
10.0000 mg | Freq: Once | INTRAMUSCULAR | Status: AC
  Administered 2014-12-05: 10 mg via INTRAVENOUS
  Filled 2014-12-05: qty 1

## 2014-12-05 MED ORDER — SODIUM CHLORIDE 0.9 % IV SOLN
750.0000 mg/m2 | Freq: Once | INTRAVENOUS | Status: AC
  Administered 2014-12-05: 1140 mg via INTRAVENOUS
  Filled 2014-12-05: qty 29.98

## 2014-12-05 MED ORDER — PALONOSETRON HCL INJECTION 0.25 MG/5ML
INTRAVENOUS | Status: AC
  Filled 2014-12-05: qty 5

## 2014-12-05 MED ORDER — SODIUM CHLORIDE 0.9 % IV SOLN
Freq: Once | INTRAVENOUS | Status: AC
  Administered 2014-12-05: 11:00:00 via INTRAVENOUS

## 2014-12-05 MED ORDER — CARBOPLATIN CHEMO INJECTION 450 MG/45ML
190.2500 mg | Freq: Once | INTRAVENOUS | Status: AC
  Administered 2014-12-05: 190 mg via INTRAVENOUS
  Filled 2014-12-05: qty 19

## 2014-12-05 MED ORDER — DENOSUMAB 120 MG/1.7ML ~~LOC~~ SOLN
120.0000 mg | Freq: Once | SUBCUTANEOUS | Status: DC
  Filled 2014-12-05: qty 1.7

## 2014-12-05 MED ORDER — PALONOSETRON HCL INJECTION 0.25 MG/5ML
0.2500 mg | Freq: Once | INTRAVENOUS | Status: AC
  Administered 2014-12-05: 0.25 mg via INTRAVENOUS

## 2014-12-05 MED ORDER — SODIUM CHLORIDE 0.9 % IJ SOLN: 10.0000 mL | INTRAMUSCULAR | Status: DC | PRN

## 2014-12-05 NOTE — Patient Instructions (Signed)
..  [place] at [facility] Discharge Instructions  RECOMMENDATIONS MADE BY THE CONSULTANT AND ANY TEST RESULTS WILL BE SENT TO YOUR REFERRING PHYSICIAN.  Exam today per Dr. Hillis Range which is a injection for your bones will be given today and monthly -We will check labs before each treatment Return in 1 week to see T. Mick.Shackle physician assistant  Thank you for choosing [place] at [facility] to provide your oncology and hematology care.  To afford each patient quality time with our provider, please arrive at least 15 minutes before your scheduled appointment time.    You need to re-schedule your appointment should you arrive 10 or more minutes late.  We strive to give you quality time with our providers, and arriving late affects you and other patients whose appointments are after yours.  Also, if you no show three or more times for appointments you may be dismissed from the clinic at the providers discretion.     Again, thank you for choosing [facility].  Our hope is that these requests will decrease the amount of time that you wait before being seen by our physicians.       _____________________________________________________________  Should you have questions after your visit to [facility], please contact our office at [phone] between the hours of 8:30 a.m. and 4:30 p.m.  Voicemails left after 4:30 p.m. will not be returned until the following business day.  For prescription refill requests, have your pharmacy contact our office.

## 2014-12-05 NOTE — Progress Notes (Unsigned)
Chemo instructions emailed to [place], pt's son, the other day. I reviewed the chemo instructions and side effects with patient prior to starting chemo. Calendar given to patient. Distress screening done. Rocky to return here when chemo is completed and I will answer any questions that he may have.

## 2014-12-05 NOTE — Progress Notes (Signed)
[place]  PROGRESS NOTE   CHIEF COMPLAINT/PURPOSE OF CONSULTATION: Stage IV NSCLC, metastatic squamous cell carcinoma Laminectomy inferior L2,L3, superior L4 for decompression and tumor resection, L3 corpectomy with anterior fusion L2-L4 using expandable titanium cage, dorsal internal fixation andion with pedicle screws at L1,L2,L4,L5, posterior arthrodesis L1-L2 and L4-L5 Post-operative respiratory failure R middle lobe lung mass with collaspse and consolidation. L3 vertebral body mass status post decompression surgery COPD CT of head on 10/21/14 that showed remote L MCA terriotory infarct, no cancer Squamous cell carcinoma Weighed 130lbs on 09/10/14, 112 lbs on 11/21/14 CBC on 11/04/14 showed anemia.  Hemoglobin on 11/04/14, 9.3  Malnutrition   HISTORY OF PRESENT ILLNESS:  59 year old male who presents with newly diagnosed stage IV non-small cell carcinoma. He presented to the emergency department on 10/21/2014 after having multiple falls over the prior 3-4 weeks. He also noted increasing lower extremity weakness and pain. He had previously been seen in the emergency Department 3 weeks prior with similar complaints but was noted to have a normal neurologic exam. On his second presentation he had hyperreflexia on the left and symptoms of weakness. He was transferred to [facility] for an MRI. MRI showed a pathologic fracture at L3 with extension into the pedicles dorsally into the posterior elements. He had significant epidural tumor invasion.  He underwent surgical repair on 10/23/2014 with Dr. Marland Kitchen and Dr. Sherwood Gambler.  At our last visit we discussed treatment options.  The patient and family have a very good understanding that he is terminal. They understand that treatment goals are to improve pain, functional status and prolong life.   The patient notes his pain is "up and down".  His back gave him trouble last night and this morning until it was alleviated with morphine.  He  met with our nutritionist today.  Patient was encouraged to add more protein into his diet.  He will begin treatment today.    He has no new complaints. Pain is better controlled on new pain medication regimen. He denies constipation.   SOCIAL HISTORY: He lives with his mother.   Divorced 1 child Smoker, 1 ppd. started at age 77-12. Used to be 3 ppd before hospitalized ETOH, sporadic, last case 15 years ago  FAMILY HISTORY: 1 younger brother, healthy.  1 sister deceased Father deceased, lung cancer, 67 Mother is living and "fairly independent and healthy"   ROS:  14 point review of systems was performed and is negative except as detailed under history of present illness above. Review of Systems  Constitutional: Positive for weight loss and malaise/fatigue. Negative for fever, chills and diaphoresis.  HENT: Negative for congestion, ear discharge, ear pain, hearing loss, nosebleeds, sore throat and tinnitus.   Eyes: Negative.   Respiratory: Positive for hemoptysis and wheezing. Negative for cough, sputum production, shortness of breath and stridor.   Cardiovascular: Negative.   Gastrointestinal: Negative.   Genitourinary: Positive for frequency. Negative for dysuria, urgency, hematuria and flank pain.  Musculoskeletal: Positive for back pain and falls. Negative for myalgias, joint pain and neck pain.  Skin: Negative.   Neurological: Positive for focal weakness and weakness. Negative for dizziness, tingling, tremors, sensory change, speech change, seizures, loss of consciousness and headaches.  Endo/Heme/Allergies: Negative.   Psychiatric/Behavioral: Negative.    PHYSICAL EXAMINATION: ECOG PERFORMANCE STATUS: 2 - Symptomatic, <50% confined to bed   Filed Vitals:   12/05/14 0856  BP: 187/93  Pulse: 102  Temp: 98.4 F (36.9 C)  Resp: 16   Filed  Weights   12/05/14 0856  Weight: 107 lb 14.4 oz (48.943 kg)    GENERAL:alert, no distress and comfortable SKIN: skin color,  texture, turgor are normal, no rashes or significant lesions EYES: normal, Conjunctiva are pink and non-injected, sclera clear OROPHARYNX:no exudate, no erythema and lips, buccal mucosa, and tongue normal  NECK: supple, thyroid normal size, non-tender, without nodularity LYMPH:  no palpable lymphadenopathy in the cervical, axillary or inguinal LUNGS: Diminished breath sounds both lung bases HEART: regular rate & rhythm and no murmurs and no lower extremity edema ABDOMEN:abdomen soft, non-tender and normal bowel sounds Musculoskeletal:no cyanosis of digits and no clubbing  NEURO: Moves all extremities, has sensation in both his feet   PATHOLOGY:   LABORATORY DATA:  I have reviewed the data as listed CMP Latest Ref Rng 10/29/2014 10/27/2014 10/26/2014  Glucose 65 - 99 mg/dL 120(H) 117(H) 115(H)  BUN 6 - 20 mg/dL 13 10 10   Creatinine 0.61 - 1.24 mg/dL 1.09 1.12 1.04  Sodium 135 - 145 mmol/L 133(L) 135 132(L)  Potassium 3.5 - 5.1 mmol/L 3.4(L) 3.5 3.2(L)  Chloride 101 - 111 mmol/L 102 104 102  CO2 22 - 32 mmol/L 24 24 23   Calcium 8.9 - 10.3 mg/dL 8.0(L) 7.8(L) 7.6(L)  Total Protein 6.5 - 8.1 g/dL 4.8(L) - -  Total Bilirubin 0.3 - 1.2 mg/dL 0.7 - -  Alkaline Phos 38 - 126 U/L 70 - -  AST 15 - 41 U/L 23 - -  ALT 17 - 63 U/L 24 - -    Lab Results  Component Value Date   WBC 12.2* 12/05/2014   HGB 13.2 12/05/2014   HCT 38.9* 12/05/2014   MCV 89.0 12/05/2014   PLT 328 12/05/2014   NEUTROABS 8.8* 12/05/2014    ASSESSMENT AND PLAN: Stage IV NSCLC, Right middle lobe Lung mass with collapse and consolidation accompanied by large L3 vertebral body mass status post decompression surgery.Final pathology squamous cell carcinoma Weight loss, malnutrition Pain, improved History of L MCA infarct Tobacco Abuse PS2  He is going to proceed with Cycle #1 of Carboplatin/Gemzar. His PS is a 2. He is up and somewhat more active over the past 2 weeks. Pain seems to be better controlled on MS  Contin and oxycodone 10 mg for breakthrough. He is not having trouble with constipation.    He will return again next weeks for Day 8 of therapy and to assess tolerance.  They know to call in the interim with any problems or concerns.   This document serves as a record of services personally performed by Ancil Linsey, MD. It was created on her behalf by Janace Hoard, a trained medical scribe. The creation of this record is based on the scribe's personal observations and the provider's statements to them. This document has been checked and approved by the attending provider.  I have reviewed the above documentation for accuracy and completeness, and I agree with the above.  This note was electronically signed.  Kelby Fam. Whitney Muse, MD

## 2014-12-05 NOTE — Patient Instructions (Signed)
[facility] Discharge Instructions for Patients Receiving Chemotherapy  Today you received the following chemotherapy agents: Gemzar and Carboplatin.     If you develop nausea and vomiting, or diarrhea that is not controlled by your medication, call the clinic.  The clinic phone number is [phone]. Office hours are Monday-Friday 8:30am-5:00pm.  BELOW ARE SYMPTOMS THAT SHOULD BE REPORTED IMMEDIATELY:  *FEVER GREATER THAN 101.0 F  *CHILLS WITH OR WITHOUT FEVER  NAUSEA AND VOMITING THAT IS NOT CONTROLLED WITH YOUR NAUSEA MEDICATION  *UNUSUAL SHORTNESS OF BREATH  *UNUSUAL BRUISING OR BLEEDING  TENDERNESS IN MOUTH AND THROAT WITH OR WITHOUT PRESENCE OF ULCERS  *URINARY PROBLEMS  *BOWEL PROBLEMS  UNUSUAL RASH Items with * indicate a potential emergency and should be followed up as soon as possible. If you have an emergency after office hours please contact your primary care physician or go to the nearest emergency department.  Please call the clinic during office hours if you have any questions or concerns.   You may also contact the Patient Navigator at [phone] should you have any questions or need assistance in obtaining follow up care. _____________________________________________________________________ Have you asked about our STAR program?    STAR stands for Survivorship Training and Rehabilitation, and this is a nationally recognized cancer care program that focuses on survivorship and rehabilitation.  Cancer and cancer treatments may cause problems, such as, pain, making you feel tired and keeping you from doing the things that you need or want to do. Cancer rehabilitation can help. Our goal is to reduce these troubling effects and help you have the best quality of life possible.  You may receive a survey from a nurse that asks questions about your current state of health.  Based on the survey results, all eligible patients will be referred to the  [facility] program for an evaluation so we can better serve you! A frequently asked questions sheet is available upon request.

## 2014-12-05 NOTE — Progress Notes (Signed)
Asked by [facility] nursing to meet with patient due to recent significant amounts of wt loss  Contacted Pt by visiting him at his appointment  Wt Readings from Last 10 Encounters:  12/05/14 107 lb 14.4 oz (48.943 kg)  11/21/14 112 lb 3.2 oz (50.894 kg)  10/30/14 123 lb 10.9 oz (56.1 kg)  10/23/14 126 lb 15.8 oz (57.6 kg)  09/10/14 130 lb (58.968 kg)   Patient weight has fallen by >17% in 3 months. Pt and family report his weight 6 months ago was ~ 130 lbs.   His BMI is classified as underweight and he has noticeable fat/muscle wasting.   Patient reports oral intake as fair and is suffering a general decrease in appetite. He sounds to have occasional episodes of nausea, but nothing too frequent. He reports severe heartburn, almost daily.   Pt is already eating 4 small meals/snacks a day. However he sounds to be eating lower protein foods. He states he eats a lot of fruit and sandwiches. He also drinks 1-2 Ensure supplements a day. He used to take a mvi.   Discussed with pt and family member that cancer treatment puts much stress on the body and he will have much higher calorie and protein needs. Encouraged pt to eat more high kcal/protein foods. Listed these. Pt reportedly loves eggs. Encouraged him to eat these frequently. Gave him advice to add creams, dressings, toppings to everything he eats to help maintain his weight. Also discussed his eligibility for 3 free cases of Ensure.    Pt and family member were very receptive to recommendations.  Asked them to contact me if pt continues to lose weight, experiences any severe n/v/c/d, would like to receive a case of Ensure, or has any other nutrition related questions  Left my contact info, coupons, samples, and handouts titled "Increasing Calories and Protein". Will follow as he starts treatment.  Burtis Junes RD, LDN Nutrition Pager: [phone] 12/05/2014 10:16 AM

## 2014-12-05 NOTE — Progress Notes (Signed)
Patient tolerated infusion well.  VSS post infusion.  Patient and daughter reminded to call the clinic with any questions or concerns.

## 2014-12-06 ENCOUNTER — Telehealth (HOSPITAL_COMMUNITY): Payer: Self-pay | Admitting: *Deleted

## 2014-12-06 NOTE — Progress Notes (Signed)
24 hour follow up spoke with pts wife states that he hasnt had any issues just he has been sleeping a lot.  He has been tired.

## 2014-12-06 NOTE — Telephone Encounter (Signed)
24 h follow up with patient's son. Edward Duran said that he is doing ok since chemo. Edward Duran is aware to call with any questions. I also called patient today but no answer.

## 2014-12-11 ENCOUNTER — Encounter (HOSPITAL_COMMUNITY): Payer: Self-pay | Admitting: Lab

## 2014-12-11 NOTE — Progress Notes (Signed)
No PCP Per Patient No address on file  Metastatic lung cancer (metastasis from lung to other site), unspecified laterality ([place]) - Plan: DNR (Do Not Resuscitate)  DNR (do not resuscitate)  CURRENT THERAPY: Carboplatin/Gemcitabine beginning on 12/05/2014  INTERVAL HISTORY: Edward Duran 59 y.o. male returns for followup of Stage IV NSCLC, squamous cell type, DNR.     Metastatic lung cancer (metastasis from lung to other site) ([place])   10/21/2014 Imaging MRI L-spine-1 cm nonenhancing lesion T10 vertebral body, worrisome for metastatic disease. No thoracic cord compression or fracture. Pathologic fracture of L3. Tumor extends into the posterior elements and transverse process on the right...   10/21/2014 Imaging CT abd/pelvis- Large destructive soft tissue mass involving the L3 vertebral as seen on the earlier MRI and compatible with malignancy. There is associated pathologic fracture and complete collapse of the right side of the L3 vertebra.   10/23/2014 Pathology Results Spinal cord, excision of mass - METASTATIC SQUAMOUS CELL CARCINOMA, SEE COMMENT. Spinal cord, excision of mass, Spine tumor - METASTATIC SQUAMOUS CELL CARCINOMA,   10/28/2014 Imaging CT L-spine-Interval L3 corpectomy, posterior decompression, and L1-L5 posterior fusion for treatment of pathologic L3 compression fracture as above. Limited evaluation of the spinal canal and for residual tumor on this unenhanced CT.   11/27/2014 Imaging CT chest- Right infrahilar mass obstructs the bronchus intermedius causing postobstructive collapse/consolidation in the right middle and right lower lobes. Together with right hilar/ipsilateral mediastinal/subcarinal adenopathy and known osseous metas   12/05/2014 -  Chemotherapy Carboplatin/Gemcitabine D1, 8 every 21 days.   12/12/2014 Advanced Directive DNR in place    I personally reviewed and went over laboratory results with the patient.  The results are noted within this  dictation.  Chart reviewed.  This is the first time I am meeting Edward Duran today.  I reviewed his diagnosis and stage with him.  I reviewed his prognosis and the role of treatment.  Systemic chemotherapy is palliative in nature.  The goal is to prolong life with progression-free and overall survival.  Additionally, treatment is to reduce effects of progressive disease.  I personally reviewed and went over radiographic studies with the patient.  The results are noted within this dictation.    He notes that his back pain persists.  I have referred him to [place] at [facility] for consideration of palliative radiation therapy to his L-spine.  The goal of this intervention is symptom management and to decrease pain.  He is agreeable to this plan.  I discussed code status with him.  In no confusing manner, he reports that he is a DNR.  He thinks he has a DNR form at home, but is not sure.  As a result, I filled out a form for him to have.  I recommended he put it on his fridge at home.    Past Medical History  Diagnosis Date  . COPD (chronic obstructive pulmonary disease) ([place])   . Hypertension   . Stroke ([place])   . Cancer ([place])   . Carotid artery disease ([place])   . 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     has No Known Allergies.  Current Outpatient Prescriptions on File Prior to Visit  Medication Sig Dispense Refill   . ALPRAZolam (XANAX) 0.25 MG tablet Take 1 tablet (0.25 mg total) by mouth 3 (three) times daily as needed for anxiety. (Patient not taking: Reported on 12/05/2014) 90 tablet 0  . budesonide-formoterol (SYMBICORT) 160-4.5 MCG/ACT inhaler Inhale 2 puffs into the lungs 2 (two) times daily. 1 Inhaler 12  . CARBOPLATIN IV Inject into the vein. Days 1 & 8 every 21 days. Starting 12/05/14    . folic acid (FOLVITE) 1 MG tablet Take 1 tablet (1 mg total) by mouth daily. (Patient not taking: Reported on 11/21/2014) 90 tablet 0  . Gemcitabine HCl (GEMZAR IV) Inject into the vein. Days 1 & 8 every 21 days. Starting 12/05/14    . ipratropium-albuterol (DUONEB) 0.5-2.5 (3) MG/3ML SOLN Take 3 mLs by nebulization every 6 (six) hours. 360 mL 3  . methocarbamol (ROBAXIN) 500 MG tablet Take 1 tablet (500 mg total) by mouth every 6 (six) hours as needed for muscle spasms. 90 tablet 0  . morphine (MS CONTIN) 15 MG 12 hr tablet Take 1 tablet (15 mg total) by mouth every 12 (twelve) hours. 60 tablet 0  . Multiple Vitamin (MULTIVITAMIN WITH MINERALS) TABS tablet Take 1 tablet by mouth daily. (Patient not taking: Reported on 11/21/2014)    . olmesartan-hydrochlorothiazide (BENICAR HCT) 20-12.5 MG tablet Take 1 tablet by mouth daily. Last filled 05/22/14 #90 tabs,patient states he is taking med    . ondansetron (ZOFRAN) 8 MG tablet Take 1 tablet (8 mg total) by mouth every 8 (eight) hours as needed for nausea or vomiting. 30 tablet 2  . oxyCODONE 10 MG TABS Take 1 tablet (10 mg total) by mouth every 4 (four) hours as needed for severe pain. 90 tablet 0  . polyethylene glycol (MIRALAX / GLYCOLAX) packet Take 17 g by mouth 2 (two) times daily. (Patient not taking: Reported on 11/21/2014) 14 each 0  . potassium chloride SA (K-DUR,KLOR-CON) 20 MEQ tablet Take 1 tablet (20 mEq total) by mouth 2 (two) times daily. 60 tablet 3  . senna-docusate (SENOKOT-S) 8.6-50 MG per tablet Take 2 tablets by mouth at bedtime. (Patient not taking:  Reported on 11/21/2014)    . tamsulosin (FLOMAX) 0.4 MG CAPS capsule Take 1 capsule (0.4 mg total) by mouth daily. 30 capsule 1   Current Facility-Administered Medications on File Prior to Visit  Medication Dose Route Frequency Provider Last Rate Last Dose  . gemcitabine (GEMZAR) 1,140 mg in sodium chloride 0.9 % 100 mL chemo infusion  750 mg/m2 (Treatment Plan Actual) Intravenous Once Patrici Ranks, MD 260 mL/hr at 12/12/14 1230 1,140 mg at 12/12/14 1230  . sodium chloride 0.9 % injection 10 mL  10 mL Intracatheter PRN Patrici Ranks, MD   10 mL at 12/12/14 1054    No past surgical history on file.  Denies any headaches, dizziness, double vision, fevers, chills, night sweats, nausea, vomiting, diarrhea, constipation, chest pain, heart palpitations, shortness of breath, blood in stool, black tarry stool, urinary pain, urinary burning, urinary frequency, hematuria.   PHYSICAL EXAMINATION  ECOG PERFORMANCE STATUS: 2 - Symptomatic, <50% confined to bed  There were no vitals filed for this visit.  GENERAL:alert, no distress, cachectic, comfortable, cooperative, smiling and unaccompanied at present time in chemo-bed finishing therapy. SKIN: skin color, texture, turgor are normal,  no rashes or significant lesions HEAD: Normocephalic, No masses, lesions, tenderness or abnormalities EYES: normal, PERRLA, EOMI, Conjunctiva are pink and non-injected EARS: External ears normal OROPHARYNX:lips, buccal mucosa, and tongue normal and mucous membranes are moist  NECK: supple, no adenopathy, thyroid normal size, non-tender, without nodularity, no stridor, non-tender, trachea midline LYMPH:  no palpable lymphadenopathy, no hepatosplenomegaly BREAST:not examined LUNGS: decreased breath sounds HEART: regular rate & rhythm and no murmurs ABDOMEN:abdomen soft and normal bowel sounds BACK: Back symmetric, no curvature. EXTREMITIES:less then 2 second capillary refill, no joint deformities, effusion, or  inflammation, no skin discoloration, no cyanosis  NEURO: alert & oriented x 3 with fluent speech, no focal motor/sensory deficits   LABORATORY DATA: CBC    Component Value Date/Time   WBC 6.8 12/12/2014 0943   RBC 4.33 12/12/2014 0943   HGB 13.2 12/12/2014 0943   HCT 38.4* 12/12/2014 0943   PLT 134* 12/12/2014 0943   MCV 88.7 12/12/2014 0943   MCH 30.5 12/12/2014 0943   MCHC 34.4 12/12/2014 0943   RDW 14.8 12/12/2014 0943   LYMPHSABS 0.8 12/12/2014 0943   MONOABS 0.5 12/12/2014 0943   EOSABS 0.3 12/12/2014 0943   BASOSABS 0.0 12/12/2014 0943      Chemistry      Component Value Date/Time   NA 134* 12/12/2014 0943   K 2.8* 12/12/2014 0943   CL 95* 12/12/2014 0943   CO2 28 12/12/2014 0943   BUN 13 12/12/2014 0943   CREATININE 1.09 12/12/2014 0943      Component Value Date/Time   CALCIUM 9.1 12/12/2014 0943   ALKPHOS 118 12/12/2014 0943   AST 23 12/12/2014 0943   ALT 14* 12/12/2014 0943   BILITOT 0.8 12/12/2014 0943        PENDING LABS:   RADIOGRAPHIC STUDIES:  Ct Chest W Contrast  11/27/2014  CLINICAL DATA:  Stage IV lung cancer, metastatic cancer to spine. EXAM: CT CHEST WITH CONTRAST TECHNIQUE: Multidetector CT imaging of the chest was performed during intravenous contrast administration. CONTRAST:  36m OMNIPAQUE IOHEXOL 300 MG/ML  SOLN COMPARISON:  CT lumbar spine 10/27/2014, CT abdomen pelvis 10/21/2014 and MR thoracic spine 10/21/2014. Associated right hilar and ipsilateral/subcarinal lymph nodes FINDINGS: Mediastinum/Nodes: Mediastinal lymph nodes measure up to 11 mm in the lower right paratracheal station. Subcarinal lymph node measures 1.7 cm. Right hilar adenopathy measures 1.7 x 2.2 cm (series 2, image 30). Right infrahilar mass measures approximately 4.3 x 5.2 cm, with mass effect on the left atrium and obstruction of the left inferior pulmonary vein. No axillary adenopathy. Atherosclerotic calcification of the arterial vasculature, including coronary  arteries. Heart size normal. Left ventricle is dilated. No pericardial effusion. Lungs/Pleura: Moderate right pleural effusion with collapse/ consolidation in the right middle lobe and right lower lobe. Associated low-attenuation within the bronchi, indicative of obstruction by the right infrahilar mass. Extrapleural lymph node at the medial base of the right hemi thorax measures 10 mm (series 2, image 58). Mild centrilobular emphysema. Left lung is clear. Complete obstruction of the bronchus intermedius. Upper abdomen: Visualized portions of the liver and adrenal glands are unremarkable. Low-attenuation lesions in the kidneys measure up to 10 mm, too small to characterize. Visualized portions of the kidneys, spleen, pancreas, stomach and bowel are otherwise grossly unremarkable. No upper abdominal adenopathy. Musculoskeletal: Postoperative changes at L1. No worrisome lytic or sclerotic lesions. IMPRESSION: 1. Right infrahilar mass obstructs the bronchus intermedius causing postobstructive collapse/consolidation in the right middle and right lower lobes. Together with right hilar/ipsilateral mediastinal/subcarinal adenopathy and  known osseous metastatic disease, findings are most consistent with stage IV primary bronchogenic carcinoma. 2. Coronary artery calcification. 3. Left ventricular dilatation. Electronically Signed   By: Lorin Picket M.D.   On: 11/27/2014 15:31     PATHOLOGY:    ASSESSMENT AND PLAN:  Metastatic lung cancer (metastasis from lung to other site) ([place]) Stage IV NSCLC, squamous cell type, with involvement of L-spine.  DNR.  Currently on palliative chemotherapy with Carboplatin/Gemcitabine  Staging completed.  Oncology history developed.  DNR status added to oncology history.  Referral to Rad Onc in [place] for consideration of palliative radiation to L-spine.  He is agreeable to this plan.  I have reviewed with the patient, as this is my first time meeting him, his diagnosis,  stage, treatment plan, and prognosis.  He understands that his disease is incurable.  I did not use the word terminal at this time, but will consider using that adjective moving forward.  We broached the topic of code status and had a successful conversation.  He wishes to be a DNR.  He reports that his son is well aware of his wishes regarding his code status.  I filled out a DNR form and provided him with it.  I made a copy for scanning into [facility].  I have placed the DNR order in [facility] and it is noted in the patient's banner.  And finally, I added it to the patient's oncology history to document the day his DNR status was established.  Hypokalemia noted.  He received PO Kdur today.  Additionally, 20 mEq BID was escribed.  He is encouraged to take this medication as prescribed to correct this electrolyte.  Return in 2 weeks for follow-up and pre-treatment labs.  THERAPY PLAN:  Continue palliative chemotherapy.  Will refer to XRT for consideration of palliative XRT to L-spine.  All questions were answered. The patient knows to call the clinic with any problems, questions or concerns. We can certainly see the patient much sooner if necessary.  Patient and plan discussed with Dr. Ancil Linsey and she is in agreement with the aforementioned.   This note is electronically signed by: Doy Mince 12/12/2014 12:53 PM

## 2014-12-11 NOTE — Progress Notes (Signed)
Referral sent to Rad Onc in [place]. Records faxed on 10/26

## 2014-12-11 NOTE — Assessment & Plan Note (Addendum)
Stage IV NSCLC, squamous cell type, with involvement of L-spine.  DNR.  Currently on palliative chemotherapy with Carboplatin/Gemcitabine  Staging completed.  Oncology history developed.  DNR status added to oncology history.  Referral to Rad Onc in [place] for consideration of palliative radiation to L-spine.  He is agreeable to this plan.  I have reviewed with the patient, as this is my first time meeting him, his diagnosis, stage, treatment plan, and prognosis.  He understands that his disease is incurable.  I did not use the word terminal at this time, but will consider using that adjective moving forward.  We broached the topic of code status and had a successful conversation.  He wishes to be a DNR.  He reports that his son is well aware of his wishes regarding his code status.  I filled out a DNR form and provided him with it.  I made a copy for scanning into [facility].  I have placed the DNR order in [facility] and it is noted in the patient's banner.  And finally, I added it to the patient's oncology history to document the day his DNR status was established.  Hypokalemia noted.  He received PO Kdur today.  Additionally, 20 mEq BID was escribed.  He is encouraged to take this medication as prescribed to correct this electrolyte.  Return in 2 weeks for follow-up and pre-treatment labs.

## 2014-12-12 ENCOUNTER — Encounter (HOSPITAL_BASED_OUTPATIENT_CLINIC_OR_DEPARTMENT_OTHER): Payer: Medicaid Other

## 2014-12-12 ENCOUNTER — Encounter (HOSPITAL_COMMUNITY): Payer: Self-pay | Admitting: Oncology

## 2014-12-12 ENCOUNTER — Encounter (HOSPITAL_BASED_OUTPATIENT_CLINIC_OR_DEPARTMENT_OTHER): Payer: Medicaid Other | Admitting: Oncology

## 2014-12-12 VITALS — BP 183/86 | HR 83 | Temp 98.5°F | Resp 18 | Wt 106.7 lb

## 2014-12-12 DIAGNOSIS — C3491 Malignant neoplasm of unspecified part of right bronchus or lung: Secondary | ICD-10-CM

## 2014-12-12 DIAGNOSIS — C7951 Secondary malignant neoplasm of bone: Secondary | ICD-10-CM | POA: Diagnosis not present

## 2014-12-12 DIAGNOSIS — C3490 Malignant neoplasm of unspecified part of unspecified bronchus or lung: Secondary | ICD-10-CM

## 2014-12-12 DIAGNOSIS — Z66 Do not resuscitate: Secondary | ICD-10-CM

## 2014-12-12 DIAGNOSIS — Z5111 Encounter for antineoplastic chemotherapy: Secondary | ICD-10-CM

## 2014-12-12 DIAGNOSIS — E876 Hypokalemia: Secondary | ICD-10-CM

## 2014-12-12 HISTORY — DX: Do not resuscitate: Z66

## 2014-12-12 LAB — COMPREHENSIVE METABOLIC PANEL
ALK PHOS: 118 U/L (ref 38–126)
ALT: 14 U/L — ABNORMAL LOW (ref 17–63)
AST: 23 U/L (ref 15–41)
Albumin: 3.6 g/dL (ref 3.5–5.0)
Anion gap: 11 (ref 5–15)
BUN: 13 mg/dL (ref 6–20)
CALCIUM: 9.1 mg/dL (ref 8.9–10.3)
CO2: 28 mmol/L (ref 22–32)
Chloride: 95 mmol/L — ABNORMAL LOW (ref 101–111)
Creatinine, Ser: 1.09 mg/dL (ref 0.61–1.24)
GFR calc non Af Amer: >60 mL/min (ref >60)
Glucose, Bld: 157 mg/dL — ABNORMAL HIGH (ref 65–99)
Potassium: 2.8 mmol/L — ABNORMAL LOW (ref 3.5–5.1)
SODIUM: 134 mmol/L — AB (ref 135–145)
Total Bilirubin: 0.8 mg/dL (ref 0.3–1.2)
Total Protein: 7.4 g/dL (ref 6.5–8.1)

## 2014-12-12 LAB — CBC WITH DIFFERENTIAL/PLATELET
Basophils Absolute: 0 10*3/uL (ref 0.0–0.1)
Basophils Relative: 0 %
EOS PCT: 5 %
Eosinophils Absolute: 0.3 10*3/uL (ref 0.0–0.7)
HCT: 38.4 % — ABNORMAL LOW (ref 39.0–52.0)
Hemoglobin: 13.2 g/dL (ref 13.0–17.0)
LYMPHS PCT: 11 %
Lymphs Abs: 0.8 10*3/uL (ref 0.7–4.0)
MCH: 30.5 pg (ref 26.0–34.0)
MCHC: 34.4 g/dL (ref 30.0–36.0)
MCV: 88.7 fL (ref 78.0–100.0)
MONO ABS: 0.5 10*3/uL (ref 0.1–1.0)
MONOS PCT: 7 %
NEUTROS ABS: 5.2 10*3/uL (ref 1.7–7.7)
Neutrophils Relative %: 77 %
PLATELETS: 134 10*3/uL — AB (ref 150–400)
RBC: 4.33 MIL/uL (ref 4.22–5.81)
RDW: 14.8 % (ref 11.5–15.5)
WBC: 6.8 10*3/uL (ref 4.0–10.5)

## 2014-12-12 MED ORDER — SODIUM CHLORIDE 0.9 % IV SOLN
750.0000 mg/m2 | Freq: Once | INTRAVENOUS | Status: AC
  Administered 2014-12-12: 1140 mg via INTRAVENOUS
  Filled 2014-12-12: qty 29.98

## 2014-12-12 MED ORDER — SODIUM CHLORIDE 0.9 % IJ SOLN
10.0000 mL | INTRAMUSCULAR | Status: DC | PRN
  Administered 2014-12-12: 10 mL
  Filled 2014-12-12: qty 10

## 2014-12-12 MED ORDER — PALONOSETRON HCL INJECTION 0.25 MG/5ML
0.2500 mg | Freq: Once | INTRAVENOUS | Status: AC
  Administered 2014-12-12: 0.25 mg via INTRAVENOUS

## 2014-12-12 MED ORDER — POTASSIUM CHLORIDE CRYS ER 20 MEQ PO TBCR
60.0000 meq | EXTENDED_RELEASE_TABLET | Freq: Once | ORAL | Status: AC
  Administered 2014-12-12: 60 meq via ORAL
  Filled 2014-12-12: qty 3

## 2014-12-12 MED ORDER — DENOSUMAB 120 MG/1.7ML ~~LOC~~ SOLN
120.0000 mg | Freq: Once | SUBCUTANEOUS | Status: AC
  Administered 2014-12-12: 120 mg via SUBCUTANEOUS
  Filled 2014-12-12: qty 1.7

## 2014-12-12 MED ORDER — DEXAMETHASONE SODIUM PHOSPHATE 100 MG/10ML IJ SOLN
10.0000 mg | Freq: Once | INTRAMUSCULAR | Status: AC
Start: 2014-12-12 — End: 2014-12-12
  Administered 2014-12-12: 10 mg via INTRAVENOUS
  Filled 2014-12-12: qty 1

## 2014-12-12 MED ORDER — SODIUM CHLORIDE 0.9 % IV SOLN
193.7500 mg | Freq: Once | INTRAVENOUS | Status: AC
  Administered 2014-12-12: 190 mg via INTRAVENOUS
  Filled 2014-12-12: qty 19

## 2014-12-12 MED ORDER — SODIUM CHLORIDE 0.9 % IV SOLN
Freq: Once | INTRAVENOUS | Status: AC
  Administered 2014-12-12: 11:00:00 via INTRAVENOUS

## 2014-12-12 MED ORDER — POTASSIUM CHLORIDE CRYS ER 20 MEQ PO TBCR: 20.0000 meq | EXTENDED_RELEASE_TABLET | Freq: Two times a day (BID) | ORAL | Status: AC

## 2014-12-12 MED ORDER — PALONOSETRON HCL INJECTION 0.25 MG/5ML
INTRAVENOUS | Status: AC
  Filled 2014-12-12: qty 5

## 2014-12-12 NOTE — Patient Instructions (Signed)
..  [place] at [facility] Discharge Instructions  RECOMMENDATIONS MADE BY THE CONSULTANT AND ANY TEST RESULTS WILL BE SENT TO YOUR REFERRING PHYSICIAN.  DNR form filled out Referral to Radiology Onc for palliative radiation to L spine KDur 20 meq twice a day   Thank you for choosing [place] at [facility] to provide your oncology and hematology care.  To afford each patient quality time with our provider, please arrive at least 15 minutes before your scheduled appointment time.    You need to re-schedule your appointment should you arrive 10 or more minutes late.  We strive to give you quality time with our providers, and arriving late affects you and other patients whose appointments are after yours.  Also, if you no show three or more times for appointments you may be dismissed from the clinic at the providers discretion.     Again, thank you for choosing [facility].  Our hope is that these requests will decrease the amount of time that you wait before being seen by our physicians.       _____________________________________________________________  Should you have questions after your visit to [facility], please contact our office at [phone] between the hours of 8:30 a.m. and 4:30 p.m.  Voicemails left after 4:30 p.m. will not be returned until the following business day.  For prescription refill requests, have your pharmacy contact our office.

## 2014-12-12 NOTE — Progress Notes (Signed)
Edward Duran presents today for injection per MD orders. Xgeva 120 mg administered SQ in left Abdomen. Administration without incident. Patient tolerated well. Tolerated chemo well.

## 2014-12-12 NOTE — Patient Instructions (Signed)
[facility] Discharge Instructions for Patients Receiving Chemotherapy  Today you received the following chemotherapy agents Carbo and Gemzar Cycle 1 Day 8. You were also given Xgeva injection. Injection will be monthly. You were given a dose of potassium while here today for a low potassium level. A prescription [facility] to [facility] for potassium two times daily. Pick the prescription up today and take a dose tonight and then tomorrow and forward twice daily. We will make a referral to Radiation Oncology at [facility] in [place]. Someone from there will call to schedule you an initial appointment. You also signed a DO NOT RESUSCITATE form while here today. We will make that a permanent part of your record.  To help prevent nausea and vomiting after your treatment, we encourage you to take your nausea medication as instructed. If you develop nausea and vomiting that is not controlled by your nausea medication, call the clinic. If it is after clinic hours your family physician or the after hours number for the clinic or go to the Emergency Department. BELOW ARE SYMPTOMS THAT SHOULD BE REPORTED IMMEDIATELY:  *FEVER GREATER THAN 101.0 F  *CHILLS WITH OR WITHOUT FEVER  NAUSEA AND VOMITING THAT IS NOT CONTROLLED WITH YOUR NAUSEA MEDICATION  *UNUSUAL SHORTNESS OF BREATH  *UNUSUAL BRUISING OR BLEEDING  TENDERNESS IN MOUTH AND THROAT WITH OR WITHOUT PRESENCE OF ULCERS  *URINARY PROBLEMS  *BOWEL PROBLEMS  UNUSUAL RASH Items with * indicate a potential emergency and should be followed up as soon as possible.  Return as scheduled.  I have been informed and understand all the instructions given to me. I know to contact the clinic, my physician, or go to the Emergency Department if any problems should occur. I do not have any questions at this time, but understand that I may call the clinic during office hours or the Patient Navigator at [phone] should I  have any questions or need assistance in obtaining follow up care.    __________________________________________  _____________  __________ Signature of Patient or Authorized Representative            Date                   Time    __________________________________________ Nurse's Signature

## 2014-12-23 ENCOUNTER — Telehealth (HOSPITAL_COMMUNITY): Payer: Self-pay | Admitting: *Deleted

## 2014-12-23 ENCOUNTER — Other Ambulatory Visit (HOSPITAL_COMMUNITY): Payer: Self-pay | Admitting: Oncology

## 2014-12-23 DIAGNOSIS — C7951 Secondary malignant neoplasm of bone: Secondary | ICD-10-CM

## 2014-12-23 MED ORDER — MORPHINE SULFATE ER 15 MG PO TBCR: 15.0000 mg | EXTENDED_RELEASE_TABLET | Freq: Two times a day (BID) | ORAL | Status: AC

## 2014-12-23 MED ORDER — OXYCODONE HCL 10 MG PO TABS: 10.0000 mg | ORAL_TABLET | ORAL | Status: AC | PRN

## 2014-12-23 NOTE — Telephone Encounter (Signed)
Done.  Ready for pick-up  [facility] 12/23/2014 4:03 PM

## 2014-12-25 NOTE — Progress Notes (Signed)
No PCP Per Patient No address on file  Metastatic lung cancer (metastasis from lung to other site), unspecified laterality [facility])  CURRENT THERAPY: Carboplatin/Gemcitabine beginning on 12/05/2014  INTERVAL HISTORY: Edward Duran 59 y.o. male returns for followup of Stage IV NSCLC, squamous cell type, DNR.     Squamous cell carcinoma of lung, stage IV (HCC)   10/21/2014 Imaging MRI L-spine-1 cm nonenhancing lesion T10 vertebral body, worrisome for metastatic disease. No thoracic cord compression or fracture. Pathologic fracture of L3. Tumor extends into the posterior elements and transverse process on the right...   10/21/2014 Imaging CT abd/pelvis- Large destructive soft tissue mass involving the L3 vertebral as seen on the earlier MRI and compatible with malignancy. There is associated pathologic fracture and complete collapse of the right side of the L3 vertebra.   10/23/2014 Pathology Results Spinal cord, excision of mass - METASTATIC SQUAMOUS CELL CARCINOMA, SEE COMMENT. Spinal cord, excision of mass, Spine tumor - METASTATIC SQUAMOUS CELL CARCINOMA,   10/28/2014 Imaging CT L-spine-Interval L3 corpectomy, posterior decompression, and L1-L5 posterior fusion for treatment of pathologic L3 compression fracture as above. Limited evaluation of the spinal canal and for residual tumor on this unenhanced CT.   11/27/2014 Imaging CT chest- Right infrahilar mass obstructs the bronchus intermedius causing postobstructive collapse/consolidation in the right middle and right lower lobes. Together with right hilar/ipsilateral mediastinal/subcarinal adenopathy and known osseous metas   12/05/2014 -  Chemotherapy Carboplatin/Gemcitabine D1, 8 every 21 days.   12/12/2014 Advanced Directive DNR in place    I personally reviewed and went over laboratory results with the patient.  The results are noted within this dictation.  Labs at this time are pending.  If parameters are met, he will receive cycle #2  of palliative chemotherapy.  He does have an appointment for Radiation in [place] on Monday, 11/14.  He ran out of his pain medication. He called the clinic for a refill.  This message was passed to me as it is documented that a new refill was printed on Monday 11/7.  He called back for follow-up and I do not know what happened from there due to a lack of documentation.   He tolerated his first cycle of chemotherapy without difficulty.  He notes 2 episodes of loose stools.  He is educated on the definition of diarrhea.    Otherwise, he denies any issues with chemotherapy.   Past Medical History  Diagnosis Date  . COPD (chronic obstructive pulmonary disease) ([place])   . Hypertension   . Stroke ([place])   . Cancer ([place])   . Carotid artery disease ([place])   . DNR (do not resuscitate) 12/12/2014  . 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     has No Known Allergies.  Current Outpatient Prescriptions on File Prior to Visit  Medication Sig Dispense Refill  . budesonide-formoterol (SYMBICORT) 160-4.5 MCG/ACT inhaler Inhale 2 puffs into the lungs 2 (two) times daily. 1 Inhaler 12  . CARBOPLATIN IV Inject  into the vein. Days 1 & 8 every 21 days. Starting 12/05/14    . folic acid (FOLVITE) 1 MG tablet Take 1 tablet (1 mg total) by mouth daily. 90 tablet 0  . Gemcitabine HCl (GEMZAR IV) Inject into the vein. Days 1 & 8  every 21 days. Starting 12/05/14    . ipratropium-albuterol (DUONEB) 0.5-2.5 (3) MG/3ML SOLN Take 3 mLs by nebulization every 6 (six) hours. 360 mL 3  . methocarbamol (ROBAXIN) 500 MG tablet Take 1 tablet (500 mg total) by mouth every 6 (six) hours as needed for muscle spasms. 90 tablet 0  . olmesartan-hydrochlorothiazide (BENICAR HCT) 20-12.5 MG tablet Take 1 tablet by mouth daily. Last filled 05/22/14 #90 tabs,patient states he is taking med    . potassium chloride SA (K-DUR,KLOR-CON) 20 MEQ tablet Take 1 tablet (20 mEq total) by mouth 2 (two) times daily. 60 tablet 3  . senna-docusate (SENOKOT-S) 8.6-50 MG per tablet Take 2 tablets by mouth at bedtime.    . tamsulosin (FLOMAX) 0.4 MG CAPS capsule Take 1 capsule (0.4 mg total) by mouth daily. 30 capsule 1  . ALPRAZolam (XANAX) 0.25 MG tablet Take 1 tablet (0.25 mg total) by mouth 3 (three) times daily as needed for anxiety. (Patient not taking: Reported on 12/05/2014) 90 tablet 0  . morphine (MS CONTIN) 15 MG 12 hr tablet Take 1 tablet (15 mg total) by mouth every 12 (twelve) hours. (Patient not taking: Reported on 12/26/2014) 60 tablet 0  . Multiple Vitamin (MULTIVITAMIN WITH MINERALS) TABS tablet Take 1 tablet by mouth daily. (Patient not taking: Reported on 12/26/2014)    . ondansetron (ZOFRAN) 8 MG tablet Take 1 tablet (8 mg total) by mouth every 8 (eight) hours as needed for nausea or vomiting. 30 tablet 2  . Oxycodone HCl 10 MG TABS Take 1 tablet (10 mg total) by mouth every 4 (four) hours as needed. (Patient not taking: Reported on 12/26/2014) 90 tablet 0  . polyethylene glycol (MIRALAX / GLYCOLAX) packet Take 17 g by mouth 2 (two) times daily. (Patient not taking: Reported on 11/21/2014) 14 each 0   No current facility-administered medications on file prior to visit.    History reviewed. No pertinent past surgical history.  Denies any headaches, dizziness, double vision, fevers, chills, night sweats, nausea, vomiting, diarrhea,  constipation, chest pain, heart palpitations, shortness of breath, blood in stool, black tarry stool, urinary pain, urinary burning, urinary frequency, hematuria.   PHYSICAL EXAMINATION  ECOG PERFORMANCE STATUS: 2 - Symptomatic, <50% confined to bed  Filed Vitals:   12/26/14 0928  BP: 109/70  Pulse: 107  Temp: 97.3 F (36.3 C)  Resp: 20    GENERAL:alert, no distress, cachectic, comfortable, cooperative, smiling and accompanied by his son, [place]. SKIN: skin color, texture, turgor are normal, no rashes or significant lesions HEAD: Normocephalic, No masses, lesions, tenderness or abnormalities EYES: normal, PERRLA, EOMI, Conjunctiva are pink and non-injected EARS: External ears normal OROPHARYNX:lips, buccal mucosa, and tongue normal and mucous membranes are moist  NECK: supple, no adenopathy, thyroid normal size, non-tender, without nodularity, no stridor, non-tender, trachea midline LYMPH:  no palpable lymphadenopathy, no hepatosplenomegaly BREAST:not examined LUNGS: decreased breath sounds bilaterally with diffuse wheezing bilaterally. HEART: regular rate & rhythm and no murmurs ABDOMEN:abdomen soft and normal bowel sounds BACK: Back symmetric, no curvature. EXTREMITIES:less then 2 second capillary refill, no joint deformities, effusion, or inflammation, no skin discoloration, no cyanosis  NEURO: alert & oriented x 3 with fluent speech, no focal motor/sensory deficits  LABORATORY DATA: CBC    Component Value Date/Time   WBC 5.5 12/26/2014 1000   RBC 3.93* 12/26/2014 1000   HGB 12.1* 12/26/2014 1000   HCT 35.3* 12/26/2014 1000   PLT 345 12/26/2014 1000   MCV 89.8 12/26/2014 1000   MCH 30.8 12/26/2014 1000   MCHC 34.3 12/26/2014 1000   RDW 15.8* 12/26/2014 1000   LYMPHSABS 1.2 12/26/2014 1000   MONOABS 0.7 12/26/2014 1000   EOSABS 0.2 12/26/2014 1000   BASOSABS 0.1 12/26/2014 1000      Chemistry      Component Value Date/Time   NA 134* 12/12/2014 0943   K 2.8*  12/12/2014 0943   CL 95* 12/12/2014 0943   CO2 28 12/12/2014 0943   BUN 13 12/12/2014 0943   CREATININE 1.09 12/12/2014 0943      Component Value Date/Time   CALCIUM 9.1 12/12/2014 0943   ALKPHOS 118 12/12/2014 0943   AST 23 12/12/2014 0943   ALT 14* 12/12/2014 0943   BILITOT 0.8 12/12/2014 0943        PENDING LABS:   RADIOGRAPHIC STUDIES:  Ct Chest W Contrast  11/27/2014  CLINICAL DATA:  Stage IV lung cancer, metastatic cancer to spine. EXAM: CT CHEST WITH CONTRAST TECHNIQUE: Multidetector CT imaging of the chest was performed during intravenous contrast administration. CONTRAST:  27m OMNIPAQUE IOHEXOL 300 MG/ML  SOLN COMPARISON:  CT lumbar spine 10/27/2014, CT abdomen pelvis 10/21/2014 and MR thoracic spine 10/21/2014. Associated right hilar and ipsilateral/subcarinal lymph nodes FINDINGS: Mediastinum/Nodes: Mediastinal lymph nodes measure up to 11 mm in the lower right paratracheal station. Subcarinal lymph node measures 1.7 cm. Right hilar adenopathy measures 1.7 x 2.2 cm (series 2, image 30). Right infrahilar mass measures approximately 4.3 x 5.2 cm, with mass effect on the left atrium and obstruction of the left inferior pulmonary vein. No axillary adenopathy. Atherosclerotic calcification of the arterial vasculature, including coronary arteries. Heart size normal. Left ventricle is dilated. No pericardial effusion. Lungs/Pleura: Moderate right pleural effusion with collapse/ consolidation in the right middle lobe and right lower lobe. Associated low-attenuation within the bronchi, indicative of obstruction by the right infrahilar mass. Extrapleural lymph node at the medial base of the right hemi thorax measures 10 mm (series 2, image 58). Mild centrilobular emphysema. Left lung is clear. Complete obstruction of the bronchus intermedius. Upper abdomen: Visualized portions of the liver and adrenal glands are unremarkable. Low-attenuation lesions in the kidneys measure up to 10 mm, too  small to characterize. Visualized portions of the kidneys, spleen, pancreas, stomach and bowel are otherwise grossly unremarkable. No upper abdominal adenopathy. Musculoskeletal: Postoperative changes at L1. No worrisome lytic or sclerotic lesions. IMPRESSION: 1. Right infrahilar mass obstructs the bronchus intermedius causing postobstructive collapse/consolidation in the right middle and right lower lobes. Together with right hilar/ipsilateral mediastinal/subcarinal adenopathy and known osseous metastatic disease, findings are most consistent with stage IV primary bronchogenic carcinoma. 2. Coronary artery calcification. 3. Left ventricular dilatation. Electronically Signed   By: MLorin PicketM.D.   On: 11/27/2014 15:31     PATHOLOGY:    ASSESSMENT AND PLAN:  Squamous cell carcinoma of lung, stage IV (HCC) Stage IV NSCLC, squamous cell type, with involvement of L-spine.  DNR.  Currently on palliative chemotherapy with Carboplatin/Gemcitabine  Oncology history is up-to-date.    [place]  He tolerated his first cycle of chemotherapy well.  He denies any toxicities from chemotherapy.   Return in 3  weeks for follow-up and pre-treatment labs, with a return visit in 1 week for pre-chemo labs and Day 8 of cycle 2.   THERAPY PLAN:  Continue palliative chemotherapy.  Rad Onc consultation is upcoming.  All questions were answered. The patient knows to call the clinic with any problems, questions or concerns. We can certainly see the patient much sooner if necessary.  Patient and plan discussed with Dr. Ancil Linsey and she is in agreement with the aforementioned.   This note is electronically signed by: Doy Mince 12/26/2014 10:24 AM

## 2014-12-25 NOTE — Assessment & Plan Note (Addendum)
Stage IV NSCLC, squamous cell type, with involvement of L-spine.  DNR.  Currently on palliative chemotherapy with Carboplatin/Gemcitabine  Oncology history is up-to-date.    He has an upcoming appointment in [place] for [place] consults on Monday, 11/14.  He tolerated his first cycle of chemotherapy well.  He denies any toxicities from chemotherapy.   Return in 3 weeks for follow-up and pre-treatment labs, with a return visit in 1 week for pre-chemo labs and Day 8 of cycle 2.

## 2014-12-26 ENCOUNTER — Encounter (HOSPITAL_COMMUNITY): Payer: Medicaid Other | Attending: Oncology

## 2014-12-26 ENCOUNTER — Encounter (HOSPITAL_COMMUNITY): Payer: Self-pay | Admitting: Oncology

## 2014-12-26 ENCOUNTER — Encounter (HOSPITAL_BASED_OUTPATIENT_CLINIC_OR_DEPARTMENT_OTHER): Payer: Medicaid Other | Admitting: Oncology

## 2014-12-26 ENCOUNTER — Other Ambulatory Visit: Payer: Self-pay

## 2014-12-26 VITALS — BP 154/61 | HR 89 | Temp 98.0°F | Resp 18

## 2014-12-26 VITALS — BP 109/70 | HR 107 | Temp 97.3°F | Resp 20 | Wt 102.8 lb

## 2014-12-26 DIAGNOSIS — C7951 Secondary malignant neoplasm of bone: Secondary | ICD-10-CM

## 2014-12-26 DIAGNOSIS — Z5111 Encounter for antineoplastic chemotherapy: Secondary | ICD-10-CM

## 2014-12-26 DIAGNOSIS — C3491 Malignant neoplasm of unspecified part of right bronchus or lung: Secondary | ICD-10-CM | POA: Diagnosis present

## 2014-12-26 DIAGNOSIS — C3490 Malignant neoplasm of unspecified part of unspecified bronchus or lung: Secondary | ICD-10-CM

## 2014-12-26 LAB — CBC WITH DIFFERENTIAL/PLATELET
BASOS ABS: 0.1 10*3/uL (ref 0.0–0.1)
BASOS PCT: 1 %
Eosinophils Absolute: 0.2 10*3/uL (ref 0.0–0.7)
Eosinophils Relative: 3 %
HEMATOCRIT: 35.3 % — AB (ref 39.0–52.0)
HEMOGLOBIN: 12.1 g/dL — AB (ref 13.0–17.0)
Lymphocytes Relative: 22 %
Lymphs Abs: 1.2 10*3/uL (ref 0.7–4.0)
MCH: 30.8 pg (ref 26.0–34.0)
MCHC: 34.3 g/dL (ref 30.0–36.0)
MCV: 89.8 fL (ref 78.0–100.0)
MONOS PCT: 12 %
Monocytes Absolute: 0.7 10*3/uL (ref 0.1–1.0)
NEUTROS ABS: 3.4 10*3/uL (ref 1.7–7.7)
NEUTROS PCT: 62 %
Platelets: 345 10*3/uL (ref 150–400)
RBC: 3.93 MIL/uL — ABNORMAL LOW (ref 4.22–5.81)
RDW: 15.8 % — ABNORMAL HIGH (ref 11.5–15.5)
WBC: 5.5 10*3/uL (ref 4.0–10.5)

## 2014-12-26 LAB — COMPREHENSIVE METABOLIC PANEL
ALBUMIN: 3.4 g/dL — AB (ref 3.5–5.0)
ALT: 25 U/L (ref 17–63)
AST: 24 U/L (ref 15–41)
Alkaline Phosphatase: 133 U/L — ABNORMAL HIGH (ref 38–126)
Anion gap: 10 (ref 5–15)
BILIRUBIN TOTAL: 0.2 mg/dL — AB (ref 0.3–1.2)
BUN: 27 mg/dL — AB (ref 6–20)
CALCIUM: 8.6 mg/dL — AB (ref 8.9–10.3)
CO2: 24 mmol/L (ref 22–32)
CREATININE: 1.11 mg/dL (ref 0.61–1.24)
Chloride: 103 mmol/L (ref 101–111)
GFR calc Af Amer: >60 mL/min (ref >60)
GFR calc non Af Amer: >60 mL/min (ref >60)
GLUCOSE: 133 mg/dL — AB (ref 65–99)
Potassium: 3.8 mmol/L (ref 3.5–5.1)
Sodium: 137 mmol/L (ref 135–145)
TOTAL PROTEIN: 7 g/dL (ref 6.5–8.1)

## 2014-12-26 MED ORDER — PALONOSETRON HCL INJECTION 0.25 MG/5ML
0.2500 mg | Freq: Once | INTRAVENOUS | Status: AC
  Administered 2014-12-26: 0.25 mg via INTRAVENOUS

## 2014-12-26 MED ORDER — SODIUM CHLORIDE 0.9 % IV SOLN
Freq: Once | INTRAVENOUS | Status: AC
  Administered 2014-12-26: 12:00:00 via INTRAVENOUS

## 2014-12-26 MED ORDER — SODIUM CHLORIDE 0.9 % IV SOLN
191.5000 mg | Freq: Once | INTRAVENOUS | Status: AC
  Administered 2014-12-26: 190 mg via INTRAVENOUS
  Filled 2014-12-26: qty 19

## 2014-12-26 MED ORDER — SODIUM CHLORIDE 0.9 % IV SOLN
750.0000 mg/m2 | Freq: Once | INTRAVENOUS | Status: AC
  Administered 2014-12-26: 1140 mg via INTRAVENOUS
  Filled 2014-12-26: qty 29.98

## 2014-12-26 MED ORDER — HEPARIN SOD (PORK) LOCK FLUSH 100 UNIT/ML IV SOLN: 500.0000 [IU] | Freq: Once | INTRAVENOUS | Status: DC | PRN

## 2014-12-26 MED ORDER — ALPRAZOLAM 0.25 MG PO TABS: 0.2500 mg | ORAL_TABLET | Freq: Three times a day (TID) | ORAL | Status: DC | PRN

## 2014-12-26 MED ORDER — SODIUM CHLORIDE 0.9 % IV SOLN
10.0000 mg | Freq: Once | INTRAVENOUS | Status: AC
  Administered 2014-12-26: 10 mg via INTRAVENOUS
  Filled 2014-12-26: qty 1

## 2014-12-26 MED ORDER — SODIUM CHLORIDE 0.9 % IJ SOLN: 10.0000 mL | INTRAMUSCULAR | Status: DC | PRN

## 2014-12-26 MED ORDER — PALONOSETRON HCL INJECTION 0.25 MG/5ML
INTRAVENOUS | Status: AC
  Filled 2014-12-26: qty 5

## 2014-12-26 NOTE — Patient Instructions (Signed)
[facility] Discharge Instructions for Patients Receiving Chemotherapy  Today you received the following chemotherapy agents: carboplatin, Gemzar.     If you develop nausea and vomiting, or diarrhea that is not controlled by your medication, call the clinic.  The clinic phone number is [phone]. Office hours are Monday-Friday 8:30am-5:00pm.  BELOW ARE SYMPTOMS THAT SHOULD BE REPORTED IMMEDIATELY:  *FEVER GREATER THAN 101.0 F  *CHILLS WITH OR WITHOUT FEVER  NAUSEA AND VOMITING THAT IS NOT CONTROLLED WITH YOUR NAUSEA MEDICATION  *UNUSUAL SHORTNESS OF BREATH  *UNUSUAL BRUISING OR BLEEDING  TENDERNESS IN MOUTH AND THROAT WITH OR WITHOUT PRESENCE OF ULCERS  *URINARY PROBLEMS  *BOWEL PROBLEMS  UNUSUAL RASH Items with * indicate a potential emergency and should be followed up as soon as possible. If you have an emergency after office hours please contact your primary care physician or go to the nearest emergency department.  Please call the clinic during office hours if you have any questions or concerns.   You may also contact the Patient Navigator at [phone] should you have any questions or need assistance in obtaining follow up care. _____________________________________________________________________ Have you asked about our STAR program?    STAR stands for Survivorship Training and Rehabilitation, and this is a nationally recognized cancer care program that focuses on survivorship and rehabilitation.  Cancer and cancer treatments may cause problems, such as, pain, making you feel tired and keeping you from doing the things that you need or want to do. Cancer rehabilitation can help. Our goal is to reduce these troubling effects and help you have the best quality of life possible.  You may receive a survey from a nurse that asks questions about your current state of health.  Based on the survey results, all eligible patients will be referred to the [facility]  program for an evaluation so we can better serve you! A frequently asked questions sheet is available upon request.

## 2014-12-26 NOTE — Telephone Encounter (Signed)
Received fax from pharmacy. Rx sent in. [facility]

## 2014-12-26 NOTE — Patient Instructions (Signed)
[place] at [facility] Discharge Instructions  RECOMMENDATIONS MADE BY THE CONSULTANT AND ANY TEST RESULTS WILL BE SENT TO YOUR REFERRING PHYSICIAN.  Exam and discussion by Robynn Pane, PA-C Will treat today if blood counts are okay Call with fever, uncontrolled pain, nausea, vomiting or other concerns.  Follow-up: Chemo next week then off 1 week Office visit in 3 weeks.  Thank you for choosing [place] at [facility] to provide your oncology and hematology care.  To afford each patient quality time with our provider, please arrive at least 15 minutes before your scheduled appointment time.    You need to re-schedule your appointment should you arrive 10 or more minutes late.  We strive to give you quality time with our providers, and arriving late affects you and other patients whose appointments are after yours.  Also, if you no show three or more times for appointments you may be dismissed from the clinic at the providers discretion.     Again, thank you for choosing [facility].  Our hope is that these requests will decrease the amount of time that you wait before being seen by our physicians.       _____________________________________________________________  Should you have questions after your visit to [facility], please contact our office at [phone] between the hours of 8:30 a.m. and 4:30 p.m.  Voicemails left after 4:30 p.m. will not be returned until the following business day.  For prescription refill requests, have your pharmacy contact our office.

## 2015-01-02 ENCOUNTER — Ambulatory Visit (HOSPITAL_COMMUNITY): Payer: Self-pay

## 2015-01-02 ENCOUNTER — Encounter (HOSPITAL_BASED_OUTPATIENT_CLINIC_OR_DEPARTMENT_OTHER): Payer: Medicaid Other

## 2015-01-02 ENCOUNTER — Encounter (HOSPITAL_COMMUNITY): Payer: Self-pay

## 2015-01-02 VITALS — BP 125/51 | HR 81 | Temp 97.8°F | Resp 16 | Wt 102.6 lb

## 2015-01-02 DIAGNOSIS — C7951 Secondary malignant neoplasm of bone: Secondary | ICD-10-CM | POA: Diagnosis not present

## 2015-01-02 DIAGNOSIS — C3491 Malignant neoplasm of unspecified part of right bronchus or lung: Secondary | ICD-10-CM

## 2015-01-02 DIAGNOSIS — Z5111 Encounter for antineoplastic chemotherapy: Secondary | ICD-10-CM

## 2015-01-02 DIAGNOSIS — C3490 Malignant neoplasm of unspecified part of unspecified bronchus or lung: Secondary | ICD-10-CM

## 2015-01-02 LAB — COMPREHENSIVE METABOLIC PANEL
ALK PHOS: 120 U/L (ref 38–126)
ALT: 20 U/L (ref 17–63)
ANION GAP: 6 (ref 5–15)
AST: 23 U/L (ref 15–41)
Albumin: 3.4 g/dL — ABNORMAL LOW (ref 3.5–5.0)
BILIRUBIN TOTAL: 0.5 mg/dL (ref 0.3–1.2)
BUN: 14 mg/dL (ref 6–20)
CALCIUM: 8 mg/dL — AB (ref 8.9–10.3)
CO2: 24 mmol/L (ref 22–32)
CREATININE: 0.93 mg/dL (ref 0.61–1.24)
Chloride: 104 mmol/L (ref 101–111)
Glucose, Bld: 160 mg/dL — ABNORMAL HIGH (ref 65–99)
Potassium: 3.7 mmol/L (ref 3.5–5.1)
SODIUM: 134 mmol/L — AB (ref 135–145)
Total Protein: 6.8 g/dL (ref 6.5–8.1)

## 2015-01-02 LAB — CBC WITH DIFFERENTIAL/PLATELET
Basophils Absolute: 0.1 10*3/uL (ref 0.0–0.1)
Basophils Relative: 1 %
EOS ABS: 0.3 10*3/uL (ref 0.0–0.7)
Eosinophils Relative: 6 %
HEMATOCRIT: 33.5 % — AB (ref 39.0–52.0)
HEMOGLOBIN: 11.4 g/dL — AB (ref 13.0–17.0)
LYMPHS ABS: 0.9 10*3/uL (ref 0.7–4.0)
Lymphocytes Relative: 18 %
MCH: 30.3 pg (ref 26.0–34.0)
MCHC: 34 g/dL (ref 30.0–36.0)
MCV: 89.1 fL (ref 78.0–100.0)
MONOS PCT: 7 %
Monocytes Absolute: 0.3 10*3/uL (ref 0.1–1.0)
NEUTROS PCT: 68 %
Neutro Abs: 3.2 10*3/uL (ref 1.7–7.7)
Platelets: 220 10*3/uL (ref 150–400)
RBC: 3.76 MIL/uL — ABNORMAL LOW (ref 4.22–5.81)
RDW: 15 % (ref 11.5–15.5)
WBC: 4.7 10*3/uL (ref 4.0–10.5)

## 2015-01-02 MED ORDER — SODIUM CHLORIDE 0.9 % IV SOLN
210.0000 mg | Freq: Once | INTRAVENOUS | Status: AC
  Administered 2015-01-02: 210 mg via INTRAVENOUS
  Filled 2015-01-02: qty 21

## 2015-01-02 MED ORDER — PALONOSETRON HCL INJECTION 0.25 MG/5ML
0.2500 mg | Freq: Once | INTRAVENOUS | Status: AC
  Administered 2015-01-02: 0.25 mg via INTRAVENOUS
  Filled 2015-01-02: qty 5

## 2015-01-02 MED ORDER — SODIUM CHLORIDE 0.9 % IJ SOLN
10.0000 mL | INTRAMUSCULAR | Status: DC | PRN
  Administered 2015-01-02 (×2): 10 mL
  Filled 2015-01-02 (×2): qty 10

## 2015-01-02 MED ORDER — DEXAMETHASONE SODIUM PHOSPHATE 100 MG/10ML IJ SOLN
10.0000 mg | Freq: Once | INTRAMUSCULAR | Status: AC
  Administered 2015-01-02: 10 mg via INTRAVENOUS
  Filled 2015-01-02: qty 1

## 2015-01-02 MED ORDER — SODIUM CHLORIDE 0.9 % IV SOLN
Freq: Once | INTRAVENOUS | Status: AC
  Administered 2015-01-02: 11:00:00 via INTRAVENOUS

## 2015-01-02 MED ORDER — SODIUM CHLORIDE 0.9 % IV SOLN
750.0000 mg/m2 | Freq: Once | INTRAVENOUS | Status: AC
  Administered 2015-01-02: 1140 mg via INTRAVENOUS
  Filled 2015-01-02: qty 29.98

## 2015-01-02 NOTE — Patient Instructions (Signed)
[facility] Discharge Instructions for Patients Receiving Chemotherapy  Today you received the following chemotherapy agents gemzar and carbo Follow up as scheduled Please call the clinic if you have any questions or concerns  To help prevent nausea and vomiting after your treatment, we encourage you to take your nausea medication    If you develop nausea and vomiting, or diarrhea that is not controlled by your medication, call the clinic.  The clinic phone number is [phone]. Office hours are Monday-Friday 8:30am-5:00pm.  BELOW ARE SYMPTOMS THAT SHOULD BE REPORTED IMMEDIATELY:  *FEVER GREATER THAN 101.0 F  *CHILLS WITH OR WITHOUT FEVER  NAUSEA AND VOMITING THAT IS NOT CONTROLLED WITH YOUR NAUSEA MEDICATION  *UNUSUAL SHORTNESS OF BREATH  *UNUSUAL BRUISING OR BLEEDING  TENDERNESS IN MOUTH AND THROAT WITH OR WITHOUT PRESENCE OF ULCERS  *URINARY PROBLEMS  *BOWEL PROBLEMS  UNUSUAL RASH Items with * indicate a potential emergency and should be followed up as soon as possible. If you have an emergency after office hours please contact your primary care physician or go to the nearest emergency department.  Please call the clinic during office hours if you have any questions or concerns.   You may also contact the Patient Navigator at [phone] should you have any questions or need assistance in obtaining follow up care.

## 2015-01-02 NOTE — Progress Notes (Signed)
Ok to treat per Kirby Crigler PA   Kasandra Knudsen L Sivertsen Tolerated chemotherapy well  Discharged ambulatory

## 2015-01-04 ENCOUNTER — Other Ambulatory Visit (HOSPITAL_COMMUNITY): Payer: Self-pay | Admitting: Hematology & Oncology

## 2015-01-05 ENCOUNTER — Observation Stay (HOSPITAL_COMMUNITY): Payer: Medicaid Other

## 2015-01-05 ENCOUNTER — Inpatient Hospital Stay (HOSPITAL_COMMUNITY)
Admission: EM | Admit: 2015-01-05 | Discharge: 2015-01-09 | DRG: 640 | Disposition: A | Discharge status: Home Health | Payer: Medicaid Other | Attending: Internal Medicine | Admitting: Internal Medicine

## 2015-01-05 ENCOUNTER — Encounter (HOSPITAL_COMMUNITY): Payer: Self-pay

## 2015-01-05 DIAGNOSIS — R059 Cough, unspecified: Secondary | ICD-10-CM

## 2015-01-05 DIAGNOSIS — R627 Adult failure to thrive: Secondary | ICD-10-CM | POA: Diagnosis present

## 2015-01-05 DIAGNOSIS — J42 Unspecified chronic bronchitis: Secondary | ICD-10-CM

## 2015-01-05 DIAGNOSIS — E860 Dehydration: Principal | ICD-10-CM

## 2015-01-05 DIAGNOSIS — D696 Thrombocytopenia, unspecified: Secondary | ICD-10-CM | POA: Diagnosis present

## 2015-01-05 DIAGNOSIS — C3490 Malignant neoplasm of unspecified part of unspecified bronchus or lung: Secondary | ICD-10-CM | POA: Diagnosis present

## 2015-01-05 DIAGNOSIS — Z7951 Long term (current) use of inhaled steroids: Secondary | ICD-10-CM

## 2015-01-05 DIAGNOSIS — Z79891 Long term (current) use of opiate analgesic: Secondary | ICD-10-CM

## 2015-01-05 DIAGNOSIS — T451X5A Adverse effect of antineoplastic and immunosuppressive drugs, initial encounter: Secondary | ICD-10-CM | POA: Diagnosis present

## 2015-01-05 DIAGNOSIS — C7951 Secondary malignant neoplasm of bone: Secondary | ICD-10-CM | POA: Diagnosis present

## 2015-01-05 DIAGNOSIS — Z9221 Personal history of antineoplastic chemotherapy: Secondary | ICD-10-CM

## 2015-01-05 DIAGNOSIS — R05 Cough: Secondary | ICD-10-CM

## 2015-01-05 DIAGNOSIS — C3431 Malignant neoplasm of lower lobe, right bronchus or lung: Secondary | ICD-10-CM | POA: Diagnosis present

## 2015-01-05 DIAGNOSIS — R29898 Other symptoms and signs involving the musculoskeletal system: Secondary | ICD-10-CM

## 2015-01-05 DIAGNOSIS — Z809 Family history of malignant neoplasm, unspecified: Secondary | ICD-10-CM

## 2015-01-05 DIAGNOSIS — I10 Essential (primary) hypertension: Secondary | ICD-10-CM | POA: Diagnosis present

## 2015-01-05 DIAGNOSIS — G8929 Other chronic pain: Secondary | ICD-10-CM | POA: Diagnosis present

## 2015-01-05 DIAGNOSIS — C7889 Secondary malignant neoplasm of other digestive organs: Secondary | ICD-10-CM | POA: Diagnosis present

## 2015-01-05 DIAGNOSIS — F0150 Vascular dementia without behavioral disturbance: Secondary | ICD-10-CM | POA: Diagnosis present

## 2015-01-05 DIAGNOSIS — Z8673 Personal history of transient ischemic attack (TIA), and cerebral infarction without residual deficits: Secondary | ICD-10-CM

## 2015-01-05 DIAGNOSIS — J449 Chronic obstructive pulmonary disease, unspecified: Secondary | ICD-10-CM | POA: Diagnosis present

## 2015-01-05 DIAGNOSIS — D638 Anemia in other chronic diseases classified elsewhere: Secondary | ICD-10-CM | POA: Diagnosis present

## 2015-01-05 DIAGNOSIS — E43 Unspecified severe protein-calorie malnutrition: Secondary | ICD-10-CM | POA: Diagnosis present

## 2015-01-05 DIAGNOSIS — Z681 Body mass index (BMI) 19 or less, adult: Secondary | ICD-10-CM

## 2015-01-05 DIAGNOSIS — D630 Anemia in neoplastic disease: Secondary | ICD-10-CM | POA: Diagnosis present

## 2015-01-05 DIAGNOSIS — Z66 Do not resuscitate: Secondary | ICD-10-CM | POA: Diagnosis present

## 2015-01-05 DIAGNOSIS — F17210 Nicotine dependence, cigarettes, uncomplicated: Secondary | ICD-10-CM | POA: Diagnosis present

## 2015-01-05 DIAGNOSIS — I639 Cerebral infarction, unspecified: Secondary | ICD-10-CM | POA: Diagnosis present

## 2015-01-05 HISTORY — DX: Malignant neoplasm of stomach, unspecified: C16.9

## 2015-01-05 LAB — CBC WITH DIFFERENTIAL/PLATELET
Basophils Absolute: 0 10*3/uL (ref 0.0–0.1)
Basophils Relative: 0 %
Eosinophils Absolute: 0.1 10*3/uL (ref 0.0–0.7)
Eosinophils Relative: 2 %
HCT: 32.4 % — ABNORMAL LOW (ref 39.0–52.0)
Hemoglobin: 11 g/dL — ABNORMAL LOW (ref 13.0–17.0)
Lymphocytes Relative: 9 %
Lymphs Abs: 0.7 10*3/uL (ref 0.7–4.0)
MCH: 30.4 pg (ref 26.0–34.0)
MCHC: 34 g/dL (ref 30.0–36.0)
MCV: 89.5 fL (ref 78.0–100.0)
Monocytes Absolute: 0 10*3/uL — ABNORMAL LOW (ref 0.1–1.0)
Monocytes Relative: 0 %
Neutro Abs: 7 10*3/uL (ref 1.7–7.7)
Neutrophils Relative %: 89 %
Platelets: 112 10*3/uL — ABNORMAL LOW (ref 150–400)
RBC: 3.62 MIL/uL — ABNORMAL LOW (ref 4.22–5.81)
RDW: 15 % (ref 11.5–15.5)
WBC: 7.9 10*3/uL (ref 4.0–10.5)

## 2015-01-05 LAB — COMPREHENSIVE METABOLIC PANEL
ALT: 28 U/L (ref 17–63)
AST: 47 U/L — ABNORMAL HIGH (ref 15–41)
Albumin: 3.7 g/dL (ref 3.5–5.0)
Alkaline Phosphatase: 130 U/L — ABNORMAL HIGH (ref 38–126)
Anion gap: 13 (ref 5–15)
BUN: 18 mg/dL (ref 6–20)
CO2: 23 mmol/L (ref 22–32)
Calcium: 8.4 mg/dL — ABNORMAL LOW (ref 8.9–10.3)
Chloride: 99 mmol/L — ABNORMAL LOW (ref 101–111)
Creatinine, Ser: 1 mg/dL (ref 0.61–1.24)
GFR calc Af Amer: >60 mL/min (ref >60)
GFR calc non Af Amer: >60 mL/min (ref >60)
Glucose, Bld: 101 mg/dL — ABNORMAL HIGH (ref 65–99)
Potassium: 3.6 mmol/L (ref 3.5–5.1)
Sodium: 135 mmol/L (ref 135–145)
Total Bilirubin: 0.7 mg/dL (ref 0.3–1.2)
Total Protein: 7.3 g/dL (ref 6.5–8.1)

## 2015-01-05 LAB — URINALYSIS, ROUTINE W REFLEX MICROSCOPIC
Bilirubin Urine: NEGATIVE
Glucose, UA: NEGATIVE mg/dL
Hgb urine dipstick: NEGATIVE
Ketones, ur: NEGATIVE mg/dL
Leukocytes, UA: NEGATIVE
Nitrite: NEGATIVE
Specific Gravity, Urine: 1.02 (ref 1.005–1.030)
pH: 6 (ref 5.0–8.0)

## 2015-01-05 LAB — URINE MICROSCOPIC-ADD ON
RBC / HPF: NONE SEEN RBC/hpf (ref 0–5)
Squamous Epithelial / LPF: NONE SEEN
WBC, UA: NONE SEEN WBC/hpf (ref 0–5)

## 2015-01-05 MED ORDER — MORPHINE SULFATE (PF) 4 MG/ML IV SOLN: 4.0000 mg | INTRAVENOUS | Status: DC | PRN

## 2015-01-05 MED ORDER — MORPHINE SULFATE ER 15 MG PO TBCR
15.0000 mg | EXTENDED_RELEASE_TABLET | Freq: Two times a day (BID) | ORAL | Status: DC
  Administered 2015-01-05 – 2015-01-09 (×8): 15 mg via ORAL
  Filled 2015-01-05 (×8): qty 1

## 2015-01-05 MED ORDER — ONDANSETRON HCL 4 MG/2ML IJ SOLN
4.0000 mg | Freq: Once | INTRAMUSCULAR | Status: AC
  Administered 2015-01-05: 4 mg via INTRAVENOUS
  Filled 2015-01-05: qty 2

## 2015-01-05 MED ORDER — ENSURE ENLIVE PO LIQD
237.0000 mL | Freq: Two times a day (BID) | ORAL | Status: DC
  Administered 2015-01-06 – 2015-01-09 (×7): 237 mL via ORAL

## 2015-01-05 MED ORDER — ALPRAZOLAM 0.25 MG PO TABS: 0.2500 mg | ORAL_TABLET | Freq: Three times a day (TID) | ORAL | Status: DC | PRN

## 2015-01-05 MED ORDER — SODIUM CHLORIDE 0.9 % IV SOLN: INTRAVENOUS | Status: DC

## 2015-01-05 MED ORDER — ONDANSETRON HCL 4 MG/2ML IJ SOLN: 4.0000 mg | Freq: Four times a day (QID) | INTRAMUSCULAR | Status: DC | PRN

## 2015-01-05 MED ORDER — SODIUM CHLORIDE 0.9 % IV BOLUS (SEPSIS)
1000.0000 mL | Freq: Once | INTRAVENOUS | Status: AC
  Administered 2015-01-05: 1000 mL via INTRAVENOUS

## 2015-01-05 MED ORDER — IPRATROPIUM-ALBUTEROL 0.5-2.5 (3) MG/3ML IN SOLN
3.0000 mL | Freq: Four times a day (QID) | RESPIRATORY_TRACT | Status: DC | PRN
Start: 2015-01-05 — End: 2015-01-09
  Administered 2015-01-06: 3 mL via RESPIRATORY_TRACT
  Filled 2015-01-05: qty 3

## 2015-01-05 MED ORDER — ENOXAPARIN SODIUM 40 MG/0.4ML ~~LOC~~ SOLN
40.0000 mg | SUBCUTANEOUS | Status: DC
  Administered 2015-01-05: 40 mg via SUBCUTANEOUS
  Filled 2015-01-05: qty 0.4

## 2015-01-05 MED ORDER — POTASSIUM CHLORIDE CRYS ER 20 MEQ PO TBCR
20.0000 meq | EXTENDED_RELEASE_TABLET | Freq: Two times a day (BID) | ORAL | Status: DC
  Administered 2015-01-05 – 2015-01-09 (×8): 20 meq via ORAL
  Filled 2015-01-05 (×8): qty 1

## 2015-01-05 MED ORDER — SODIUM CHLORIDE 0.9 % IV SOLN
INTRAVENOUS | Status: DC
Start: 2015-01-05 — End: 2015-01-09
  Administered 2015-01-05: 75 mL/h via INTRAVENOUS
  Administered 2015-01-07 (×2): via INTRAVENOUS

## 2015-01-05 MED ORDER — MORPHINE SULFATE (PF) 2 MG/ML IV SOLN
2.0000 mg | INTRAVENOUS | Status: DC | PRN
  Administered 2015-01-06: 2 mg via INTRAVENOUS
  Filled 2015-01-05: qty 1

## 2015-01-05 MED ORDER — ONDANSETRON HCL 4 MG PO TABS: 4.0000 mg | ORAL_TABLET | Freq: Four times a day (QID) | ORAL | Status: DC | PRN

## 2015-01-05 MED ORDER — HYDROCHLOROTHIAZIDE 12.5 MG PO CAPS
12.5000 mg | ORAL_CAPSULE | Freq: Every day | ORAL | Status: DC
  Administered 2015-01-06 – 2015-01-09 (×4): 12.5 mg via ORAL
  Filled 2015-01-05 (×7): qty 1

## 2015-01-05 MED ORDER — BUDESONIDE-FORMOTEROL FUMARATE 160-4.5 MCG/ACT IN AERO
2.0000 | INHALATION_SPRAY | Freq: Two times a day (BID) | RESPIRATORY_TRACT | Status: DC
Start: 2015-01-05 — End: 2015-01-09
  Administered 2015-01-05 – 2015-01-09 (×8): 2 via RESPIRATORY_TRACT
  Filled 2015-01-05: qty 6

## 2015-01-05 MED ORDER — OLMESARTAN MEDOXOMIL-HCTZ 20-12.5 MG PO TABS: 1.0000 | ORAL_TABLET | Freq: Every day | ORAL | Status: DC

## 2015-01-05 MED ORDER — HYDROMORPHONE HCL 1 MG/ML IJ SOLN
1.0000 mg | Freq: Once | INTRAMUSCULAR | Status: AC
  Administered 2015-01-05: 1 mg via INTRAVENOUS
  Filled 2015-01-05: qty 1

## 2015-01-05 MED ORDER — METHYLPREDNISOLONE SODIUM SUCC 125 MG IJ SOLR
125.0000 mg | Freq: Once | INTRAMUSCULAR | Status: AC
  Administered 2015-01-05: 125 mg via INTRAVENOUS
  Filled 2015-01-05: qty 2

## 2015-01-05 MED ORDER — IRBESARTAN 150 MG PO TABS
150.0000 mg | ORAL_TABLET | Freq: Every day | ORAL | Status: DC
  Administered 2015-01-06 – 2015-01-09 (×4): 150 mg via ORAL
  Filled 2015-01-05 (×7): qty 1

## 2015-01-05 MED ORDER — POLYETHYLENE GLYCOL 3350 17 G PO PACK: 17.0000 g | PACK | Freq: Every day | ORAL | Status: DC | PRN

## 2015-01-05 MED ORDER — METHOCARBAMOL 500 MG PO TABS
500.0000 mg | ORAL_TABLET | Freq: Four times a day (QID) | ORAL | Status: DC | PRN
Start: 2015-01-05 — End: 2015-01-09

## 2015-01-05 MED ORDER — BUDESONIDE-FORMOTEROL FUMARATE 160-4.5 MCG/ACT IN AERO
INHALATION_SPRAY | RESPIRATORY_TRACT | Status: AC
  Filled 2015-01-05: qty 6

## 2015-01-05 MED ORDER — MORPHINE SULFATE (PF) 2 MG/ML IV SOLN
2.0000 mg | INTRAVENOUS | Status: DC | PRN
  Administered 2015-01-05: 2 mg via INTRAVENOUS
  Filled 2015-01-05: qty 1

## 2015-01-05 NOTE — ED Notes (Signed)
Pt reports unable to eat since Thursday. He has dx stomach cancer and received chemo on Thursday. Developed pain in right lower anterior rib cage which has caused nausea and inability to eat due to pain

## 2015-01-05 NOTE — H&P (Signed)
[facility] History and Physical  Edward Duran WER:[id number] DOB: 11/26/1955 DOA: 01/05/2015  Referring physician: ER PCP: No PCP Per Patient   Chief Complaint: Anorexia, weakness  HPI: Edward Duran is a 59 y.o. male  This is a 59 year old man who has stage IV squamous cell carcinoma of the lung and received chemotherapy 3 days ago. In the last 48 hours, he has gone downhill with anorexia, weakness, mostly lying in bed. He also apparently has been somewhat more confused. He takes pain medicines for back pain. He also has a cough which is long-standing but does not appear to be productive. He denies any fever. He is not much more short of breath than usual. He has COPD. He is continue to smoke a pack of cigarettes a day. He is now being admitted to the hospital for further management.   Review of Systems:  Apart from symptoms above, all systems are negative.  Past Medical History  Diagnosis Date  . COPD (chronic obstructive pulmonary disease) ([place])   . Hypertension   . Stroke ([place])   . Cancer ([place])   . Carotid artery disease ([place])   . DNR (do not resuscitate) 12/12/2014  . Squamous cell carcinoma of lung, stage IV ([place]) 12/02/2014    Squamous Cell Histology   . Stomach cancer ([place])   . Stomach cancer ([place])    History reviewed. No pertinent past surgical history. Social History:  reports that he has been smoking.  He has never used smokeless tobacco. He reports that he does not drink alcohol or use illicit drugs.  No Known Allergies  Family History  Problem Relation Age of Onset  . Cancer Mother   . Cancer Father     Prior to Admission medications   Medication Sig Start Date End Date Taking? Authorizing Provider  ALPRAZolam (XANAX) 0.25 MG tablet Take 1 tablet (0.25 mg total) by mouth 3 (three) times daily as needed for anxiety. 12/26/14  Yes Meredith Staggers, MD  budesonide-formoterol [facility]) 160-4.5 MCG/ACT inhaler Inhale 2 puffs into the lungs 2 (two) times  daily. 11/06/14  Yes Daniel J Angiulli, PA-C  CARBOPLATIN IV Inject into the vein. Days 1 & 8 every 21 days. Starting 12/05/14   Yes Historical Provider, MD  Gemcitabine HCl (GEMZAR IV) Inject into the vein. Days 1 & 8 every 21 days. Starting 12/05/14   Yes Historical Provider, MD  ipratropium-albuterol (DUONEB) 0.5-2.5 (3) MG/3ML SOLN Take 3 mLs by nebulization every 6 (six) hours. Patient taking differently: Take 3 mLs by nebulization every 6 (six) hours as needed (shortness of breath/wheezing).  11/21/14  Yes Patrici Ranks, MD  methocarbamol (ROBAXIN) 500 MG tablet Take 1 tablet (500 mg total) by mouth every 6 (six) hours as needed for muscle spasms. 11/06/14  Yes Daniel J Angiulli, PA-C  morphine (MS CONTIN) 15 MG 12 hr tablet Take 1 tablet (15 mg total) by mouth every 12 (twelve) hours. 12/23/14  Yes Manon Hilding Kefalas, PA-C  olmesartan-hydrochlorothiazide (BENICAR HCT) 20-12.5 MG tablet Take 1 tablet by mouth daily.    Yes Historical Provider, MD  ondansetron (ZOFRAN) 8 MG tablet Take 1 tablet (8 mg total) by mouth every 8 (eight) hours as needed for nausea or vomiting. 12/03/14  Yes Patrici Ranks, MD  Oxycodone HCl 10 MG TABS Take 1 tablet (10 mg total) by mouth every 4 (four) hours as needed. Patient taking differently: Take 10 mg by mouth every 4 (four) hours as needed. pain 12/23/14  Yes Marcello Moores  S Kefalas, PA-C  polyethylene glycol (MIRALAX / GLYCOLAX) packet Take 17 g by mouth 2 (two) times daily. Patient taking differently: Take 17 g by mouth daily as needed for mild constipation.  10/28/14  Yes Shanker Kristeen Mans, MD  potassium chloride SA (K-DUR,KLOR-CON) 20 MEQ tablet Take 1 tablet (20 mEq total) by mouth 2 (two) times daily. 12/12/14  Yes Baird Cancer, PA-C  folic acid (FOLVITE) 1 MG tablet Take 1 tablet (1 mg total) by mouth daily. Patient not taking: Reported on 01/05/2015 11/06/14   Lavon Paganini Angiulli, PA-C  Multiple Vitamin (MULTIVITAMIN WITH MINERALS) TABS tablet Take 1 tablet by  mouth daily. Patient not taking: Reported on 12/26/2014 11/06/14   Lavon Paganini Angiulli, PA-C  senna-docusate (SENOKOT-S) 8.6-50 MG per tablet Take 2 tablets by mouth at bedtime. Patient not taking: Reported on 01/05/2015 10/28/14   Jonetta Osgood, MD  tamsulosin (FLOMAX) 0.4 MG CAPS capsule Take 1 capsule (0.4 mg total) by mouth daily. Patient not taking: Reported on 01/05/2015 11/06/14   Cathlyn Parsons, PA-C   Physical Exam: Filed Vitals:   01/05/15 1357 01/05/15 1400 01/05/15 1430 01/05/15 1528  BP: 142/78 131/73 126/73 138/85  Pulse: 94   97  Temp:    98.1 F (36.7 C)  TempSrc:    Oral  Resp: 18   18  Height:      Weight:      SpO2: 98%   95%    Wt Readings from Last 3 Encounters:  01/05/15 46.267 kg (102 lb)  01/02/15 46.539 kg (102 lb 9.6 oz)  12/26/14 46.63 kg (102 lb 12.8 oz)    General:  Appears cachectic and dehydrated. Eyes: PERRL, normal lids, irises & conjunctiva ENT: grossly normal hearing, lips & tongue Neck: no LAD, masses or thyromegaly Cardiovascular: RRR, no m/r/g. No LE edema. Telemetry: SR, no arrhythmias  Respiratory: Bilateral wheezing, minimally tight. No crackles or bronchial breathing. Abdomen: soft, ntnd Skin: no rash or induration seen on limited exam Musculoskeletal: grossly normal tone BUE/BLE Psychiatric: grossly normal mood and affect, speech fluent and appropriate Neurologic: grossly non-focal.          Labs on Admission:  Basic Metabolic Panel:  Recent Labs Lab 01/02/15 0934 01/05/15 1225  NA 134* 135  K 3.7 3.6  CL 104 99*  CO2 24 23  GLUCOSE 160* 101*  BUN 14 18  CREATININE 0.93 1.00  CALCIUM 8.0* 8.4*   Liver Function Tests:  Recent Labs Lab 01/02/15 0934 01/05/15 1225  AST 23 47*  ALT 20 28  ALKPHOS 120 130*  BILITOT 0.5 0.7  PROT 6.8 7.3  ALBUMIN 3.4* 3.7   No results for input(s): LIPASE, AMYLASE in the last 168 hours. No results for input(s): AMMONIA in the last 168 hours. CBC:  Recent Labs Lab  01/02/15 0934 01/05/15 1225  WBC 4.7 7.9  NEUTROABS 3.2 7.0  HGB 11.4* 11.0*  HCT 33.5* 32.4*  MCV 89.1 89.5  PLT 220 112*   Cardiac Enzymes: No results for input(s): CKTOTAL, CKMB, CKMBINDEX, TROPONINI in the last 168 hours.  BNP (last 3 results) No results for input(s): BNP in the last 8760 hours.  ProBNP (last 3 results) No results for input(s): PROBNP in the last 8760 hours.  CBG: No results for input(s): GLUCAP in the last 168 hours.  Radiological Exams on Admission: No results found.    Assessment/Plan   1. Dehydration. He'll be given IV fluids. 2. COPD. One dose of IV steroids. 3. Metastatic lung  cancer. This has been associated with his most recent Chemotherapy and failed to thrive. 4. Hypertension. Continue with same medications. 5. Debility. This is likely related to the metastatic lung cancer dehydration. I will ask physical therapy for further evaluation.  He'll be admitted to the medical floor. Further recommendations will depend on patient's hospital progress.   Code Status: DO NOT RESUSCITATE.  DVT Prophylaxis: Lovenox.  Family Communication: I discussed the plan with family at the bedside.   Disposition Plan: Home when medically stable.   Time spent: 45 minutes.  Doree Albee [facility] Pager [phone].

## 2015-01-05 NOTE — ED Provider Notes (Signed)
CSN: [id number]     Arrival date & time 01/05/15  1036 History  By signing my name below, I, Meriel Pica, attest that this documentation has been prepared under the direction and in the presence of Virgel Manifold, MD. Electronically Signed: Meriel Pica, ED Scribe. 01/05/2015. 12:12 PM.   Chief Complaint  Patient presents with  . Failure To Thrive   The history is provided by the patient and a relative. No language interpreter was used.   HPI Comments: Edward Duran is a 59 y.o. male, with a PMhx of COPD, HTN, CVA, CAD, and metastatic lung cancer, who presents to the Emergency Department complaining of progressively worsening, constant decreased appetite X 3 days, weakness and 1 episode of emesis. Pt receives chemotherapy for metastatic lung cancer with last treatment being 4 days ago. Per son, the pt has had 4 chemo treatments prior to this past treatment and has been able to tolerate the treatments. Son also states the pt has been experiencing delayed response since this morning which is not his baseline. Son notes the pt also appears dehydrated. Patient laid in bed last night until this morning because was unable to get up and ambulate to bathroom because of severe pain and generalized weakness. Has been having constant, severe back pain which is his baseline s/p internal fixation and fusion in lumbar spine 2 months ago, per son. Last dose of morphine was 2.5 hours ago.  Denies HA, SOB, abdominal pain, nausea, diarrhea and fevers.   Past Medical History  Diagnosis Date  . COPD (chronic obstructive pulmonary disease) ([place])   . Hypertension   . Stroke ([place])   . Cancer ([place])   . Carotid artery disease ([place])   . DNR (do not resuscitate) 12/12/2014  . Squamous cell carcinoma of lung, stage IV ([place]) 12/02/2014    Squamous Cell Histology   . Stomach cancer ([place])   . Stomach cancer ([place])    History reviewed. No pertinent past surgical history. Family History  Problem Relation Age of  Onset  . Cancer Mother   . Cancer Father    Social History  Substance Use Topics  . Smoking status: Current Every Day Smoker -- 1.00 packs/day  . Smokeless tobacco: Never Used  . Alcohol Use: No    Review of Systems  Constitutional: Positive for appetite change. Negative for fever.  Cardiovascular: Positive for chest pain.  Gastrointestinal: Positive for vomiting. Negative for nausea, abdominal pain, diarrhea and blood in stool.  Genitourinary: Negative for dysuria, urgency, frequency and hematuria.  Musculoskeletal: Positive for back pain.  Neurological: Positive for weakness ( generalized ). Negative for headaches.  All other systems reviewed and are negative.  Allergies  Review of patient's allergies indicates no known allergies.  Home Medications   Prior to Admission medications   Medication Sig Start Date End Date Taking? Authorizing Provider  ALPRAZolam (XANAX) 0.25 MG tablet Take 1 tablet (0.25 mg total) by mouth 3 (three) times daily as needed for anxiety. 12/26/14  Yes Meredith Staggers, MD  budesonide-formoterol [facility]) 160-4.5 MCG/ACT inhaler Inhale 2 puffs into the lungs 2 (two) times daily. 11/06/14  Yes Daniel J Angiulli, PA-C  CARBOPLATIN IV Inject into the vein. Days 1 & 8 every 21 days. Starting 12/05/14   Yes Historical Provider, MD  Gemcitabine HCl (GEMZAR IV) Inject into the vein. Days 1 & 8 every 21 days. Starting 12/05/14   Yes Historical Provider, MD  ipratropium-albuterol (DUONEB) 0.5-2.5 (3) MG/3ML SOLN Take 3 mLs by nebulization every  6 (six) hours. Patient taking differently: Take 3 mLs by nebulization every 6 (six) hours as needed (shortness of breath/wheezing).  11/21/14  Yes Patrici Ranks, MD  methocarbamol (ROBAXIN) 500 MG tablet Take 1 tablet (500 mg total) by mouth every 6 (six) hours as needed for muscle spasms. 11/06/14  Yes Daniel J Angiulli, PA-C  morphine (MS CONTIN) 15 MG 12 hr tablet Take 1 tablet (15 mg total) by mouth every 12 (twelve)  hours. 12/23/14  Yes Manon Hilding Kefalas, PA-C  olmesartan-hydrochlorothiazide (BENICAR HCT) 20-12.5 MG tablet Take 1 tablet by mouth daily.    Yes Historical Provider, MD  ondansetron (ZOFRAN) 8 MG tablet Take 1 tablet (8 mg total) by mouth every 8 (eight) hours as needed for nausea or vomiting. 12/03/14  Yes Patrici Ranks, MD  Oxycodone HCl 10 MG TABS Take 1 tablet (10 mg total) by mouth every 4 (four) hours as needed. Patient taking differently: Take 10 mg by mouth every 4 (four) hours as needed. pain 12/23/14  Yes Manon Hilding Kefalas, PA-C  polyethylene glycol (MIRALAX / GLYCOLAX) packet Take 17 g by mouth 2 (two) times daily. Patient taking differently: Take 17 g by mouth daily as needed for mild constipation.  10/28/14  Yes Shanker Kristeen Mans, MD  potassium chloride SA (K-DUR,KLOR-CON) 20 MEQ tablet Take 1 tablet (20 mEq total) by mouth 2 (two) times daily. 12/12/14  Yes Baird Cancer, PA-C  folic acid (FOLVITE) 1 MG tablet Take 1 tablet (1 mg total) by mouth daily. Patient not taking: Reported on 01/05/2015 11/06/14   Lavon Paganini Angiulli, PA-C  Multiple Vitamin (MULTIVITAMIN WITH MINERALS) TABS tablet Take 1 tablet by mouth daily. Patient not taking: Reported on 12/26/2014 11/06/14   Lavon Paganini Angiulli, PA-C  senna-docusate (SENOKOT-S) 8.6-50 MG per tablet Take 2 tablets by mouth at bedtime. Patient not taking: Reported on 01/05/2015 10/28/14   Jonetta Osgood, MD  tamsulosin (FLOMAX) 0.4 MG CAPS capsule Take 1 capsule (0.4 mg total) by mouth daily. Patient not taking: Reported on 01/05/2015 11/06/14   Lavon Paganini Angiulli, PA-C   BP 141/79 mmHg  Pulse 98  Temp(Src) 98 F (36.7 C) (Oral)  Resp 16  Ht '5\' 6"'$  (1.676 m)  Wt 102 lb (46.267 kg)  BMI 16.47 kg/m2  SpO2 98% Physical Exam  Constitutional: He is oriented to person, place, and time.  Pt appears chronically ill and tired.   HENT:  Head: Normocephalic and atraumatic.  Eyes: EOM are normal.  Neck: Normal range of motion.   Cardiovascular: Normal rate, regular rhythm, normal heart sounds and intact distal pulses.   Pulmonary/Chest: Effort normal. No respiratory distress. He has wheezes.  Rhonchi and wheezing bilaterally.   Abdominal: Soft. He exhibits no distension. There is no tenderness.  Musculoskeletal: Normal range of motion.  Midline back incision appears to be healing well.   Neurological: He is alert and oriented to person, place, and time.  Drowsy and answers to questions were somewhat slowed, but answers appropriately. Follows commands. No focal motor deficit.  Skin: Skin is warm and dry.  Psychiatric: His behavior is normal. Judgment and thought content normal.  Nursing note and vitals reviewed.   ED Course  Procedures  DIAGNOSTIC STUDIES: Oxygen Saturation is 98% on RA, normal by my interpretation.    COORDINATION OF CARE: 12:09 PM Discussed treatment plan with pt at bedside and pt agreed to plan.  Labs Review Labs Reviewed  CBC WITH DIFFERENTIAL/PLATELET - Abnormal; Notable for the following:  RBC 3.62 (*)    Hemoglobin 11.0 (*)    HCT 32.4 (*)    Platelets 112 (*)    Monocytes Absolute 0.0 (*)    All other components within normal limits  COMPREHENSIVE METABOLIC PANEL - Abnormal; Notable for the following:    Chloride 99 (*)    Glucose, Bld 101 (*)    Calcium 8.4 (*)    AST 47 (*)    Alkaline Phosphatase 130 (*)    All other components within normal limits  URINALYSIS, ROUTINE W REFLEX MICROSCOPIC (NOT AT [facility]) - Abnormal; Notable for the following:    Protein, ur TRACE (*)    All other components within normal limits  URINE MICROSCOPIC-ADD ON - Abnormal; Notable for the following:    Bacteria, UA RARE (*)    All other components within normal limits    I have personally reviewed and evaluated these lab results as part of my medical decision-making.   MDM   Final diagnoses:  Squamous cell carcinoma of lung, stage IV, unspecified laterality (HCC)  Failure to thrive  syndrome, adult    59 year old-male with essentially failure to thrive and poorly controlled pain. Metastatic cancer undergoing palliative treatment. I do not think there is an acute emergent condition but rather a culmination of widely metastatic CA, effects of treatment, severe pain, deconditioning, etc. Basic screening labs obtained which are unremarkable.  Afebrile. HD stable. He currently has surpassed his and family's ability to adequately care for himself at home. Lives with his mother who cannot significantly assist him. Given IVF and pain medication in ED with only mild improvement of symptoms. Is DNR. May benefit from palliative care consultation to better define goals of care. May need placement unless additional home assistance can be arranged.      Virgel Manifold, MD 01/05/15 [phone]

## 2015-01-06 ENCOUNTER — Observation Stay (HOSPITAL_COMMUNITY): Payer: Medicaid Other

## 2015-01-06 DIAGNOSIS — C7951 Secondary malignant neoplasm of bone: Secondary | ICD-10-CM | POA: Diagnosis present

## 2015-01-06 DIAGNOSIS — R627 Adult failure to thrive: Secondary | ICD-10-CM | POA: Diagnosis present

## 2015-01-06 DIAGNOSIS — C7889 Secondary malignant neoplasm of other digestive organs: Secondary | ICD-10-CM | POA: Diagnosis present

## 2015-01-06 DIAGNOSIS — T451X5A Adverse effect of antineoplastic and immunosuppressive drugs, initial encounter: Secondary | ICD-10-CM | POA: Diagnosis present

## 2015-01-06 DIAGNOSIS — D630 Anemia in neoplastic disease: Secondary | ICD-10-CM | POA: Diagnosis present

## 2015-01-06 DIAGNOSIS — E860 Dehydration: Secondary | ICD-10-CM | POA: Diagnosis present

## 2015-01-06 DIAGNOSIS — I639 Cerebral infarction, unspecified: Secondary | ICD-10-CM | POA: Diagnosis not present

## 2015-01-06 DIAGNOSIS — G8929 Other chronic pain: Secondary | ICD-10-CM | POA: Diagnosis present

## 2015-01-06 DIAGNOSIS — Z8673 Personal history of transient ischemic attack (TIA), and cerebral infarction without residual deficits: Secondary | ICD-10-CM | POA: Diagnosis not present

## 2015-01-06 DIAGNOSIS — F0150 Vascular dementia without behavioral disturbance: Secondary | ICD-10-CM | POA: Diagnosis present

## 2015-01-06 DIAGNOSIS — J449 Chronic obstructive pulmonary disease, unspecified: Secondary | ICD-10-CM | POA: Diagnosis present

## 2015-01-06 DIAGNOSIS — D696 Thrombocytopenia, unspecified: Secondary | ICD-10-CM | POA: Diagnosis present

## 2015-01-06 DIAGNOSIS — I619 Nontraumatic intracerebral hemorrhage, unspecified: Secondary | ICD-10-CM | POA: Diagnosis not present

## 2015-01-06 DIAGNOSIS — I6789 Other cerebrovascular disease: Secondary | ICD-10-CM | POA: Diagnosis not present

## 2015-01-06 DIAGNOSIS — Z681 Body mass index (BMI) 19 or less, adult: Secondary | ICD-10-CM | POA: Diagnosis not present

## 2015-01-06 DIAGNOSIS — F17210 Nicotine dependence, cigarettes, uncomplicated: Secondary | ICD-10-CM | POA: Diagnosis present

## 2015-01-06 DIAGNOSIS — D638 Anemia in other chronic diseases classified elsewhere: Secondary | ICD-10-CM | POA: Diagnosis present

## 2015-01-06 DIAGNOSIS — R531 Weakness: Secondary | ICD-10-CM | POA: Diagnosis present

## 2015-01-06 DIAGNOSIS — Z9221 Personal history of antineoplastic chemotherapy: Secondary | ICD-10-CM | POA: Diagnosis not present

## 2015-01-06 DIAGNOSIS — Z66 Do not resuscitate: Secondary | ICD-10-CM | POA: Diagnosis present

## 2015-01-06 DIAGNOSIS — I10 Essential (primary) hypertension: Secondary | ICD-10-CM | POA: Diagnosis not present

## 2015-01-06 DIAGNOSIS — C3431 Malignant neoplasm of lower lobe, right bronchus or lung: Secondary | ICD-10-CM | POA: Diagnosis present

## 2015-01-06 DIAGNOSIS — C3490 Malignant neoplasm of unspecified part of unspecified bronchus or lung: Secondary | ICD-10-CM | POA: Diagnosis not present

## 2015-01-06 DIAGNOSIS — Z809 Family history of malignant neoplasm, unspecified: Secondary | ICD-10-CM | POA: Diagnosis not present

## 2015-01-06 DIAGNOSIS — J42 Unspecified chronic bronchitis: Secondary | ICD-10-CM | POA: Diagnosis not present

## 2015-01-06 DIAGNOSIS — E43 Unspecified severe protein-calorie malnutrition: Secondary | ICD-10-CM | POA: Diagnosis present

## 2015-01-06 DIAGNOSIS — Z7951 Long term (current) use of inhaled steroids: Secondary | ICD-10-CM | POA: Diagnosis not present

## 2015-01-06 DIAGNOSIS — Z79891 Long term (current) use of opiate analgesic: Secondary | ICD-10-CM | POA: Diagnosis not present

## 2015-01-06 LAB — COMPREHENSIVE METABOLIC PANEL
ALK PHOS: 94 U/L (ref 38–126)
ALT: 24 U/L (ref 17–63)
AST: 34 U/L (ref 15–41)
Albumin: 2.9 g/dL — ABNORMAL LOW (ref 3.5–5.0)
Anion gap: 7 (ref 5–15)
BILIRUBIN TOTAL: 0.5 mg/dL (ref 0.3–1.2)
BUN: 19 mg/dL (ref 6–20)
CALCIUM: 7.4 mg/dL — AB (ref 8.9–10.3)
CO2: 22 mmol/L (ref 22–32)
Chloride: 106 mmol/L (ref 101–111)
Creatinine, Ser: 0.89 mg/dL (ref 0.61–1.24)
GFR calc Af Amer: >60 mL/min (ref >60)
GLUCOSE: 113 mg/dL — AB (ref 65–99)
POTASSIUM: 4.5 mmol/L (ref 3.5–5.1)
Sodium: 135 mmol/L (ref 135–145)
TOTAL PROTEIN: 5.6 g/dL — AB (ref 6.5–8.1)

## 2015-01-06 LAB — LIPID PANEL
CHOL/HDL RATIO: 3.4 ratio
CHOLESTEROL: 178 mg/dL (ref 0–200)
HDL: 53 mg/dL (ref >40)
LDL Cholesterol: 107 mg/dL — ABNORMAL HIGH (ref 0–99)
Triglycerides: 88 mg/dL (ref <150)
VLDL: 18 mg/dL (ref 0–40)

## 2015-01-06 LAB — CBC
HEMATOCRIT: 27.2 % — AB (ref 39.0–52.0)
HEMOGLOBIN: 9.3 g/dL — AB (ref 13.0–17.0)
MCH: 30.6 pg (ref 26.0–34.0)
MCHC: 34.2 g/dL (ref 30.0–36.0)
MCV: 89.5 fL (ref 78.0–100.0)
Platelets: 89 10*3/uL — ABNORMAL LOW (ref 150–400)
RBC: 3.04 MIL/uL — ABNORMAL LOW (ref 4.22–5.81)
RDW: 14.8 % (ref 11.5–15.5)
WBC: 6.6 10*3/uL (ref 4.0–10.5)

## 2015-01-06 MED ORDER — GI COCKTAIL ~~LOC~~
30.0000 mL | Freq: Once | ORAL | Status: AC
  Administered 2015-01-06: 30 mL via ORAL
  Filled 2015-01-06: qty 30

## 2015-01-06 MED ORDER — GADOBENATE DIMEGLUMINE 529 MG/ML IV SOLN
9.0000 mL | Freq: Once | INTRAVENOUS | Status: AC | PRN
  Administered 2015-01-06: 9 mL via INTRAVENOUS

## 2015-01-06 NOTE — Consult Note (Signed)
[place] A. Merlene Laughter, MD     [email or web address]          Edward Duran is an 59 y.o. male.   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  Vascular dementia.  Stage V metastatic lung disease.  RECOMMENDATION: Dual antiplatelet agent for 3 months. Subsequently, the patient should be on a single antiplatelet agent. Dementia labs.    The patient is a 18-year-old white male who has a history of stage IV metastatic lung disease. Patient presented with the diffuse weakness. He has been receiving chemotherapy and that got his last dose about 3 days ago. The patient's history is no coherent that see the seem to have some confusion during the evaluation. He does not report other symptoms at this time however. He does have a lot of perseveration and confabulation.  GENERAL: No acute distress  HEENT: Supple. Atraumatic normocephalic.   ABDOMEN: soft  EXTREMITIES: No edema   BACK: Normal.  SKIN: Normal by inspection.    MENTAL STATUS: The patient is awake and alert. He is oriented only to self/person. He has a lot of perseveration and some confabulation throughout the recording. He does follow commands. The patient states his age as 6 and the month as January.  CRANIAL NERVES: Pupils are equal, round and reactive to light and accommodation; extra ocular movements are full, there is no significant nystagmus; visual fields are full; upper and lower facial muscles are normal in strength and symmetric, there is no flattening of the nasolabial folds; tongue is midline; uvula is midline; shoulder elevation is normal.  MOTOR: Normal tone, bulk and strength- R; LUE pronator drift. No drift of the legs. LUE 3/5; LLU 5/5  COORDINATION: Left finger to nose is normal, right finger to nose is normal, No rest tremor; no intention tremor; no postural tremor; no bradykinesia.  REFLEXES: Deep tendon reflexes are symmetrical and  normal, but brisk in the legs. Babinski reflexes are extensor bilaterally.   SENSATION: Normal to light touch. The patient extinguishes to double some nystagmus stimulation on the left side.   NIH stroke scale 5   The brain MRI is reviewed in person. There is increased signal seen on diffusion imaging involving the right parietal occipital watershed area but also extends to the parasagittal and the anterior watershed regions. There is a large encephalomalacia indicating a prior infarct involving the left parieto-occipital region. No enhancement is seen on contrast administration   Blood pressure 129/68, pulse 87, temperature 97.9 F (36.6 C), temperature source Oral, resp. rate 18, height 5' 6"  (1.676 m), weight 46.267 kg (102 lb), SpO2 100 %.  Past Medical History  Diagnosis Date  . COPD (chronic obstructive pulmonary disease) ([place])   . Hypertension   . Stroke ([place])   . Cancer ([place])   . Carotid artery disease ([place])   . DNR (do not resuscitate) 12/12/2014  . Squamous cell carcinoma of lung, stage IV ([place]) 12/02/2014    Squamous Cell Histology   . Stomach cancer ([place])   . Stomach cancer ([place])     History reviewed. No pertinent past surgical history.  Family History  Problem Relation Age of Onset  . Cancer Mother   . Cancer Father     Social History:  reports that he has been smoking.  He has never used smokeless tobacco. He reports that he does not drink alcohol or use illicit drugs.  Allergies: No Known Allergies  Medications: Prior to Admission medications   Medication  Sig Start Date End Date Taking? Authorizing Provider  ALPRAZolam (XANAX) 0.25 MG tablet Take 1 tablet (0.25 mg total) by mouth 3 (three) times daily as needed for anxiety. 12/26/14  Yes Meredith Staggers, MD  budesonide-formoterol [facility]) 160-4.5 MCG/ACT inhaler Inhale 2 puffs into the lungs 2 (two) times daily. 11/06/14  Yes Daniel J Angiulli, PA-C  CARBOPLATIN IV Inject into the vein. Days 1 & 8 every  21 days. Starting 12/05/14   Yes Historical Provider, MD  Gemcitabine HCl (GEMZAR IV) Inject into the vein. Days 1 & 8 every 21 days. Starting 12/05/14   Yes Historical Provider, MD  ipratropium-albuterol (DUONEB) 0.5-2.5 (3) MG/3ML SOLN Take 3 mLs by nebulization every 6 (six) hours. Patient taking differently: Take 3 mLs by nebulization every 6 (six) hours as needed (shortness of breath/wheezing).  11/21/14  Yes Patrici Ranks, MD  methocarbamol (ROBAXIN) 500 MG tablet Take 1 tablet (500 mg total) by mouth every 6 (six) hours as needed for muscle spasms. 11/06/14  Yes Daniel J Angiulli, PA-C  morphine (MS CONTIN) 15 MG 12 hr tablet Take 1 tablet (15 mg total) by mouth every 12 (twelve) hours. 12/23/14  Yes Manon Hilding Kefalas, PA-C  olmesartan-hydrochlorothiazide (BENICAR HCT) 20-12.5 MG tablet Take 1 tablet by mouth daily.    Yes Historical Provider, MD  ondansetron (ZOFRAN) 8 MG tablet Take 1 tablet (8 mg total) by mouth every 8 (eight) hours as needed for nausea or vomiting. 12/03/14  Yes Patrici Ranks, MD  Oxycodone HCl 10 MG TABS Take 1 tablet (10 mg total) by mouth every 4 (four) hours as needed. Patient taking differently: Take 10 mg by mouth every 4 (four) hours as needed. pain 12/23/14  Yes Manon Hilding Kefalas, PA-C  polyethylene glycol (MIRALAX / GLYCOLAX) packet Take 17 g by mouth 2 (two) times daily. Patient taking differently: Take 17 g by mouth daily as needed for mild constipation.  10/28/14  Yes Shanker Kristeen Mans, MD  potassium chloride SA (K-DUR,KLOR-CON) 20 MEQ tablet Take 1 tablet (20 mEq total) by mouth 2 (two) times daily. 12/12/14  Yes Baird Cancer, PA-C  folic acid (FOLVITE) 1 MG tablet Take 1 tablet (1 mg total) by mouth daily. Patient not taking: Reported on 01/05/2015 11/06/14   Lavon Paganini Angiulli, PA-C  Multiple Vitamin (MULTIVITAMIN WITH MINERALS) TABS tablet Take 1 tablet by mouth daily. Patient not taking: Reported on 12/26/2014 11/06/14   Lavon Paganini Angiulli, PA-C    senna-docusate (SENOKOT-S) 8.6-50 MG per tablet Take 2 tablets by mouth at bedtime. Patient not taking: Reported on 01/05/2015 10/28/14   Jonetta Osgood, MD  tamsulosin (FLOMAX) 0.4 MG CAPS capsule Take 1 capsule (0.4 mg total) by mouth daily. Patient not taking: Reported on 01/05/2015 11/06/14   Lavon Paganini Angiulli, PA-C    Scheduled Meds: . budesonide-formoterol  2 puff Inhalation BID  . feeding supplement (ENSURE ENLIVE)  237 mL Oral BID BM  . irbesartan  150 mg Oral Daily   And  . hydrochlorothiazide  12.5 mg Oral Daily  . morphine  15 mg Oral Q12H  . potassium chloride SA  20 mEq Oral BID   Continuous Infusions: . sodium chloride 75 mL/hr (01/05/15 1638)   PRN Meds:.ALPRAZolam, ipratropium-albuterol, methocarbamol, morphine injection, ondansetron **OR** ondansetron (ZOFRAN) IV, polyethylene glycol     Results for orders placed or performed during the hospital encounter of 01/05/15 (from the past 48 hour(s))  CBC with Differential     Status: Abnormal   Collection Time:  01/05/15 12:25 PM  Result Value Ref Range   WBC 7.9 4.0 - 10.5 K/uL   RBC 3.62 (L) 4.22 - 5.81 MIL/uL   Hemoglobin 11.0 (L) 13.0 - 17.0 g/dL   HCT 32.4 (L) 39.0 - 52.0 %   MCV 89.5 78.0 - 100.0 fL   MCH 30.4 26.0 - 34.0 pg   MCHC 34.0 30.0 - 36.0 g/dL   RDW 15.0 11.5 - 15.5 %   Platelets 112 (L) 150 - 400 K/uL    Comment: SPECIMEN CHECKED FOR CLOTS LARGE PLATELETS PRESENT PLATELET COUNT CONFIRMED BY SMEAR    Neutrophils Relative % 89 %   Neutro Abs 7.0 1.7 - 7.7 K/uL   Lymphocytes Relative 9 %   Lymphs Abs 0.7 0.7 - 4.0 K/uL   Monocytes Relative 0 %   Monocytes Absolute 0.0 (L) 0.1 - 1.0 K/uL   Eosinophils Relative 2 %   Eosinophils Absolute 0.1 0.0 - 0.7 K/uL   Basophils Relative 0 %   Basophils Absolute 0.0 0.0 - 0.1 K/uL  Comprehensive metabolic panel     Status: Abnormal   Collection Time: 01/05/15 12:25 PM  Result Value Ref Range   Sodium 135 135 - 145 mmol/L   Potassium 3.6 3.5 - 5.1  mmol/L   Chloride 99 (L) 101 - 111 mmol/L   CO2 23 22 - 32 mmol/L   Glucose, Bld 101 (H) 65 - 99 mg/dL   BUN 18 6 - 20 mg/dL   Creatinine, Ser 1.00 0.61 - 1.24 mg/dL   Calcium 8.4 (L) 8.9 - 10.3 mg/dL   Total Protein 7.3 6.5 - 8.1 g/dL   Albumin 3.7 3.5 - 5.0 g/dL   AST 47 (H) 15 - 41 U/L   ALT 28 17 - 63 U/L   Alkaline Phosphatase 130 (H) 38 - 126 U/L   Total Bilirubin 0.7 0.3 - 1.2 mg/dL   GFR calc non Af Amer >60 >60 mL/min   GFR calc Af Amer >60 >60 mL/min    Comment: (NOTE) The eGFR has been calculated using the CKD EPI equation. This calculation has not been validated in all clinical situations. eGFR's persistently <60 mL/min signify possible Chronic Kidney Disease.    Anion gap 13 5 - 15  Urinalysis, Routine w reflex microscopic (not at [facility])     Status: Abnormal   Collection Time: 01/05/15 12:30 PM  Result Value Ref Range   Color, Urine YELLOW YELLOW   APPearance CLEAR CLEAR   Specific Gravity, Urine 1.020 1.005 - 1.030   pH 6.0 5.0 - 8.0   Glucose, UA NEGATIVE NEGATIVE mg/dL   Hgb urine dipstick NEGATIVE NEGATIVE   Bilirubin Urine NEGATIVE NEGATIVE   Ketones, ur NEGATIVE NEGATIVE mg/dL   Protein, ur TRACE (A) NEGATIVE mg/dL   Nitrite NEGATIVE NEGATIVE   Leukocytes, UA NEGATIVE NEGATIVE  Urine microscopic-add on     Status: Abnormal   Collection Time: 01/05/15 12:30 PM  Result Value Ref Range   Squamous Epithelial / LPF NONE SEEN NONE SEEN    Comment: Please note change in reference range.   WBC, UA NONE SEEN 0 - 5 WBC/hpf    Comment: Please note change in reference range.   RBC / HPF NONE SEEN 0 - 5 RBC/hpf    Comment: Please note change in reference range.   Bacteria, UA RARE (A) NONE SEEN    Comment: Please note change in reference range.  CBC     Status: Abnormal   Collection Time: 01/06/15  7:16 AM  Result Value Ref Range   WBC 6.6 4.0 - 10.5 K/uL   RBC 3.04 (L) 4.22 - 5.81 MIL/uL   Hemoglobin 9.3 (L) 13.0 - 17.0 g/dL   HCT 27.2 (L) 39.0 - 52.0 %    MCV 89.5 78.0 - 100.0 fL   MCH 30.6 26.0 - 34.0 pg   MCHC 34.2 30.0 - 36.0 g/dL   RDW 14.8 11.5 - 15.5 %   Platelets 89 (L) 150 - 400 K/uL    Comment: SPECIMEN CHECKED FOR CLOTS PLATELET COUNT CONFIRMED BY SMEAR   Comprehensive metabolic panel     Status: Abnormal   Collection Time: 01/06/15  7:16 AM  Result Value Ref Range   Sodium 135 135 - 145 mmol/L   Potassium 4.5 3.5 - 5.1 mmol/L    Comment: DELTA CHECK NOTED   Chloride 106 101 - 111 mmol/L   CO2 22 22 - 32 mmol/L   Glucose, Bld 113 (H) 65 - 99 mg/dL   BUN 19 6 - 20 mg/dL   Creatinine, Ser 0.89 0.61 - 1.24 mg/dL   Calcium 7.4 (L) 8.9 - 10.3 mg/dL   Total Protein 5.6 (L) 6.5 - 8.1 g/dL   Albumin 2.9 (L) 3.5 - 5.0 g/dL   AST 34 15 - 41 U/L   ALT 24 17 - 63 U/L   Alkaline Phosphatase 94 38 - 126 U/L   Total Bilirubin 0.5 0.3 - 1.2 mg/dL   GFR calc non Af Amer >60 >60 mL/min   GFR calc Af Amer >60 >60 mL/min    Comment: (NOTE) The eGFR has been calculated using the CKD EPI equation. This calculation has not been validated in all clinical situations. eGFR's persistently <60 mL/min signify possible Chronic Kidney Disease.    Anion gap 7 5 - 15    Studies/Results:   BRAIN MRI/MRA 1. Acute nonhemorrhagic infarcts in the posterior right cerebral hemisphere, posterior watershed distribution. 2. 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 3. Nondiagnostic postcontrast imaging due to motion.      Malia Corsi A. Merlene Laughter, M.D.  Diplomate, Tax adviser of Psychiatry and Neurology ( Neurology). 01/06/2015, 4:40 PM

## 2015-01-06 NOTE — Progress Notes (Signed)
Physical Therapy Evaluation Patient Details Name: Edward Duran MRN: [id number] DOB: 06-21-1955 Today's Date: 01/06/2015   History of Present Illness   This is a 59 year old man who has stage IV squamous cell carcinoma of the lung and received chemotherapy 3 days ago. In the last 48 hours, he has gone downhill with anorexia, weakness, mostly lying in bed. He also apparently has been somewhat more confused. He takes pain medicines for back pain. He also has a cough which is long-standing but does not appear to be productive. He denies any fever. He is not much more short of breath than usual. He has COPD. He is continue to smoke a pack of cigarettes a day. He is now being admitted to the hospital for further management.  Clinical Impression  Pt was seen for evaluation.  He was alert and cooperative but appeared extremely frail and lacking any energy.  He normally lives with his mom and ambulates with a walker.  Currently he is found to have generalized weakness throughout with the LLE weaker than RLE and severe weakness of sudden onset in the LUE (strength generally 2/5).  He has chronic back pain and mobility is limited under the best of circumstances.  At this time he is able to get to edge of bed and stand to a walker with min to standby assist but he is not able to ambulate.  He had to be fully assisted to use a urinal and could not figure out how it works even though he has been using it since admission.  I do not think that he will be able to manage at home.  I also do not think that he could tolerate Rehab in SNF.  I question if he is appropriate for Hospice Care at this time.  I will continue to work with him if possible while here in the hospital.    Follow Up Recommendations  (to be determined)    Equipment Recommendations  None recommended by PT    Recommendations for Other Services    none   Precautions / Restrictions Precautions Precautions: Fall Restrictions Weight Bearing  Restrictions: No      Mobility  Bed Mobility Overal bed mobility: Needs Assistance Bed Mobility: Supine to Sit     Supine to sit: Min assist;HOB elevated     General bed mobility comments: pt unable to scoot forward to EOB  Transfers Overall transfer level: Needs assistance Equipment used: Rolling walker (2 wheeled) Transfers: Sit to/from Stand Sit to Stand: Min guard         General transfer comment: transfer is labored but he can get to standing without assist  Ambulation/Gait             General Gait Details: unable to ambulate.Marland Kitchen.he was able to step in place using a walker but only able to step twice with each leg  Stairs            Wheelchair Mobility    Modified Rankin (Stroke Patients Only)       Balance Overall balance assessment: Needs assistance Sitting-balance support: No upper extremity supported;Feet supported Sitting balance-Leahy Scale: Good         Standing balance comment: unable to test standing balance                             Pertinent Vitals/Pain Pain Assessment: No/denies pain    Home Living Family/patient expects to be discharged to:: Unsure  Living Arrangements: Parent               Additional Comments: lives with his elderly mother, son stops by daily    Prior Function Level of Independence: Independent with assistive device(s)               Hand Dominance        Extremity/Trunk Assessment   Upper Extremity Assessment: LUE deficits/detail       LUE Deficits / Details: severe sudden weakness in the LUE with 2/5 strength throughout along with proprioceptive loss....this arm is currently not functional   Lower Extremity Assessment: Generalized weakness (LLE weaker than RLE)      Cervical / Trunk Assessment: Kyphotic  Communication   Communication: No difficulties  Cognition Arousal/Alertness: Awake/alert Behavior During Therapy: WFL for tasks assessed/performed Overall  Cognitive Status: Within Functional Limits for tasks assessed (son indicates that pt seems confused)                      General Comments      Exercises        Assessment/Plan    PT Assessment Patient needs continued PT services  PT Diagnosis Difficulty walking;Generalized weakness   PT Problem List Decreased strength;Decreased activity tolerance;Decreased mobility;Cardiopulmonary status limiting activity;Pain  PT Treatment Interventions Gait training;Functional mobility training;Therapeutic exercise   PT Goals (Current goals can be found in the Care Plan section) Acute Rehab PT Goals Patient Stated Goal: none stated PT Goal Formulation: With patient Time For Goal Achievement: 01/20/15 Potential to Achieve Goals: Poor    Frequency Min 3X/week   Barriers to discharge Inaccessible home environment;Decreased caregiver support steps into home, mother is elderly    Co-evaluation               End of Session Equipment Utilized During Treatment: Gait belt Activity Tolerance: Patient limited by fatigue Patient left: in bed;with call bell/phone within reach;with nursing/sitter in room Nurse Communication: Mobility status         Time: 5681-2751 PT Time Calculation (min) (ACUTE ONLY): 45 min   Charges:   PT Evaluation $Initial PT Evaluation Tier I: 1 Procedure     PT G CodesSable Feil  PT 01/06/2015, 12:39 PM [phone]

## 2015-01-06 NOTE — Care Management Note (Signed)
Case Management Note  Patient Details  Name: Edward Duran MRN: [id number] Date of Birth: Nov 05, 1955  Subjective/Objective:                  Pt admitted from home with lung cancer, failure to thrive. Pt lives with his mother and will return home at discharge. Pt has a walker, cane and neb machine for home use. Pt stated he does his own ADL's but just has to take his time.  Action/Plan: Will continue to follow for discharge planning needs. ? Need for home health services at discharge.  Expected Discharge Date:                  Expected Discharge Plan:  [place]  In-House Referral:  NA  Discharge planning Services  CM Consult  Post Acute Care Choice:  Home Health Choice offered to:  Patient  DME Arranged:    DME Agency:     HH Arranged:    [place] Agency:     Status of Service:  In process, will continue to follow  Medicare Important Message Given:    Date Medicare IM Given:    Medicare IM give by:    Date Additional Medicare IM Given:    Additional Medicare Important Message give by:     If discussed at [place] of Stay Meetings, dates discussed:    Additional Comments:  Joylene Draft, RN 01/06/2015, 3:03 PM

## 2015-01-06 NOTE — Progress Notes (Signed)
[facility] PROGRESS NOTE  Edward Duran XFG:[id number] DOB: March 10, 1955 DOA: 01/05/2015 PCP: No PCP Per Patient  Assessment/Plan: 1. Failure to thrive since starting chemotherapy. Family reportst he has lost appx 40 pounds in the last two months and he has "good days" and "bad days." Will request nutrition consult.   2. Dehydration, secondary to FTT. Continue IVF. 3. Anemia of chronic disease, likely related to underlying malignancy. No evidence of bleeding.  4. COPD. Pt reportedly SOB on admission. He received neb treatments and one dose of steroids. Respiratory status appears to have returned to baseline. Family reports he may not be using his bronchodilators as prescribed. Breathing treatments PRN. 5. HTN. Continue home medications.  6. Chronic pain. Continue pain medication regimen.  7. Thrombocytopenia. Likely related to recent chemo. Continue to monitor.  8. Metastatic lung cancer stage IV on palliative chemotherapy. Last chemo 11/17. Followed by Dr. Whitney Muse, oncologist. He has been referred to radiation oncology for symptom management in his lumbar spine.  9. Left arm weakness. Son reports over the past several days he has noticed that the patient has not been using his LUE. Patient denies any pain in his left arm, but feels that it may be heavy. Exam is very inconsistent. At times he is unable to lift his arm entirely and at other times he is able to lift his arm to nearly his forehead. There maybe some difficulty in comprehension. UA was unremarkable on admission. Can consider MRI of brain to rule out metastasis of disease will request oncology input.   Code Status: DNR DVT prophylaxis: SCDs Family Communication: Family bedside.  Disposition Plan: Anticipate discharge within 1-2 days   Consultants:    Procedures:    Antibiotics:    HPI/Subjective: Feeling ok. Breathing comes and goes. Improvement with breathing treatment. Denies nausea, vomiting, or pain different  from his chronic back pain. Denies pacemaker. Left arm feels heavier then right.   Family bedside states the first couple treatments went well but noticed that last week he started to decline. Sat 19 was his worse. He was not able to ambulate. The last week he has not been able to ambulate without assistance. He has has a steady decline in PO intake. He lives with his elderly mother and may not be taking his medications as prescribed. Reports that the chemo is palliative vs curative. His last chemo was 11/17. He also noticed decrease use of his left side of his body. His confusion has improved since being hydrated.    Objective: Filed Vitals:   01/05/15 2031 01/06/15 0545  BP: 135/66 126/65  Pulse: 89 81  Temp: 98.2 F (36.8 C) 97.7 F (36.5 C)  Resp: 17 18    Intake/Output Summary (Last 24 hours) at 01/06/15 0713 Last data filed at 01/06/15 9678  Gross per 24 hour  Intake 1026.25 ml  Output    300 ml  Net 726.25 ml   Filed Weights   01/05/15 1052 01/05/15 1632  Weight: 46.267 kg (102 lb) 46.267 kg (102 lb)    Exam: General:  Appears comfortable. Cachectic. Temporal wasting noted.   Cardiovascular: Regular rate and rhythm, no murmur, rub or gallop. No lower extremity edema. Respiratory: Clear to auscultation bilaterally, no wheezes, rales or rhonchi. Normal respiratory effort. Abdomen: soft, ntnd Musculoskeletal: no LE edema. Psychiatric: grossly normal mood and affect, speech fluent and appropriate Neurologic: grossly non-focal. LE strength equal bilaterally. RUE 5/5. LUE 4/5 but inconsistent. Shoulder shrug is diminished on the left side.  Data Reviewed: Basic Metabolic Panel:  Recent Labs Lab 01/02/15 0934 01/05/15 1225  NA 134* 135  K 3.7 3.6  CL 104 99*  CO2 24 23  GLUCOSE 160* 101*  BUN 14 18  CREATININE 0.93 1.00  CALCIUM 8.0* 8.4*   Liver Function Tests:  Recent Labs Lab 01/02/15 0934 01/05/15 1225  AST 23 47*  ALT 20 28  ALKPHOS 120 130*   BILITOT 0.5 0.7  PROT 6.8 7.3  ALBUMIN 3.4* 3.7   CBC:  Recent Labs Lab 01/02/15 0934 01/05/15 1225  WBC 4.7 7.9  NEUTROABS 3.2 7.0  HGB 11.4* 11.0*  HCT 33.5* 32.4*  MCV 89.1 89.5  PLT 220 112*    Studies: Dg Chest 1 View  01/05/2015  CLINICAL DATA:  Cough. Gastric cancer. Right lower lobe lung cancer. EXAM: CHEST 1 VIEW COMPARISON:  10/24/2014 chest radiograph. FINDINGS: Stable cardiomediastinal silhouette with port normal heart size and right hilar prominence. No pneumothorax. Stable small right pleural effusion. No left pleural effusion. Stable volume loss and patchy consolidation in the right middle and right lower lobes. IMPRESSION: 1. Stable volume loss and patchy consolidation in the right middle and right lower lobes, in keeping with postobstructive atelectasis/pneumonia from known obstructing central right lower lung mass. 2. Stable small right pleural effusion. Electronically Signed   By: Ilona Sorrel M.D.   On: 01/05/2015 17:40    Scheduled Meds: . budesonide-formoterol  2 puff Inhalation BID  . enoxaparin (LOVENOX) injection  40 mg Subcutaneous Q24H  . feeding supplement (ENSURE ENLIVE)  237 mL Oral BID BM  . irbesartan  150 mg Oral Daily   And  . hydrochlorothiazide  12.5 mg Oral Daily  . morphine  15 mg Oral Q12H  . potassium chloride SA  20 mEq Oral BID   Continuous Infusions: . sodium chloride 75 mL/hr (01/05/15 1638)    Active Problems:   HTN (hypertension)   COPD (chronic obstructive pulmonary disease) (HCC)   Metastatic lung cancer (metastasis from lung to other site) [facility])   Dehydration    Time spent: 30 minutes    Kathie Dike, MD  [facility] Pager [phone]. If 7PM-7AM, please contact night-coverage at [email or web address], password [facility] 01/06/2015, 7:13 AM      By signing my name below, I, Rennis Harding, attest that this documentation has been prepared under the direction and in the presence of Kathie Dike,  MD. Electronically signed: Rennis Harding, Scribe. 01/06/2015 10:57  I, Dr. Kathie Dike, personally performed the services described in this documentaiton. All medical record entries made by the scribe were at my direction and in my presence. I have reviewed the chart and agree that the record reflects my personal performance and is accurate and complete  Kathie Dike, MD, 01/06/2015 12:29 PM

## 2015-01-07 ENCOUNTER — Inpatient Hospital Stay (HOSPITAL_COMMUNITY): Payer: Medicaid Other

## 2015-01-07 ENCOUNTER — Other Ambulatory Visit (HOSPITAL_COMMUNITY): Payer: Self-pay | Admitting: Oncology

## 2015-01-07 DIAGNOSIS — I6789 Other cerebrovascular disease: Secondary | ICD-10-CM

## 2015-01-07 DIAGNOSIS — I619 Nontraumatic intracerebral hemorrhage, unspecified: Secondary | ICD-10-CM

## 2015-01-07 DIAGNOSIS — R627 Adult failure to thrive: Secondary | ICD-10-CM

## 2015-01-07 DIAGNOSIS — Z66 Do not resuscitate: Secondary | ICD-10-CM

## 2015-01-07 DIAGNOSIS — I639 Cerebral infarction, unspecified: Secondary | ICD-10-CM

## 2015-01-07 DIAGNOSIS — E43 Unspecified severe protein-calorie malnutrition: Secondary | ICD-10-CM

## 2015-01-07 LAB — BASIC METABOLIC PANEL WITH GFR
Anion gap: 8 (ref 5–15)
BUN: 16 mg/dL (ref 6–20)
CO2: 21 mmol/L — ABNORMAL LOW (ref 22–32)
Calcium: 7.7 mg/dL — ABNORMAL LOW (ref 8.9–10.3)
Chloride: 107 mmol/L (ref 101–111)
Creatinine, Ser: 0.85 mg/dL (ref 0.61–1.24)
GFR calc Af Amer: >60 mL/min
GFR calc non Af Amer: >60 mL/min
Glucose, Bld: 107 mg/dL — ABNORMAL HIGH (ref 65–99)
Potassium: 4.3 mmol/L (ref 3.5–5.1)
Sodium: 136 mmol/L (ref 135–145)

## 2015-01-07 LAB — CBC
HCT: 27.1 % — ABNORMAL LOW (ref 39.0–52.0)
Hemoglobin: 9.2 g/dL — ABNORMAL LOW (ref 13.0–17.0)
MCH: 30.3 pg (ref 26.0–34.0)
MCHC: 33.9 g/dL (ref 30.0–36.0)
MCV: 89.1 fL (ref 78.0–100.0)
PLATELETS: 69 10*3/uL — AB (ref 150–400)
RBC: 3.04 MIL/uL — AB (ref 4.22–5.81)
RDW: 15.4 % (ref 11.5–15.5)
WBC: 4.6 10*3/uL (ref 4.0–10.5)

## 2015-01-07 LAB — HEMOGLOBIN A1C
HEMOGLOBIN A1C: 6.9 % — AB (ref 4.8–5.6)
Mean Plasma Glucose: 151 mg/dL

## 2015-01-07 MED ORDER — ATORVASTATIN CALCIUM 10 MG PO TABS
10.0000 mg | ORAL_TABLET | Freq: Every day | ORAL | Status: DC
  Administered 2015-01-07 – 2015-01-08 (×2): 10 mg via ORAL
  Filled 2015-01-07 (×2): qty 1

## 2015-01-07 MED ORDER — ALUM & MAG HYDROXIDE-SIMETH 200-200-20 MG/5ML PO SUSP
30.0000 mL | ORAL | Status: DC | PRN
  Administered 2015-01-07: 30 mL via ORAL
  Filled 2015-01-07: qty 30

## 2015-01-07 NOTE — Consult Note (Signed)
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  HISTORY OF PRESENT ILLNESS:   Mr. Alen is a 59 year old white American man who is well known to the [facility] where he is actively undergoing palliative treatment for Stage IV NSCLC beginning on 12/05/2014.  He reported to the ED on 01/05/2015 with failure to thrive.  Oncology was asked to see the patient for consideration of future intervention versus transitioning to comfort care.    Squamous cell carcinoma of lung, stage IV (HCC)   10/21/2014 Imaging MRI L-spine-1 cm nonenhancing lesion T10 vertebral body, worrisome for metastatic disease. No thoracic cord compression or fracture. Pathologic fracture of L3. Tumor extends into the posterior elements and transverse process on the right...   10/21/2014 Imaging CT abd/pelvis- Large destructive soft tissue mass involving the L3 vertebral as seen on the earlier MRI and compatible with malignancy. There is associated pathologic fracture and complete collapse of the right side of the L3 vertebra.   10/23/2014 Pathology Results Spinal cord, excision of mass - METASTATIC SQUAMOUS CELL CARCINOMA, SEE COMMENT. Spinal cord, excision of mass, Spine tumor - METASTATIC SQUAMOUS CELL CARCINOMA,   10/28/2014 Imaging CT L-spine-Interval L3 corpectomy, posterior decompression, and L1-L5 posterior fusion for treatment of pathologic L3 compression fracture as above. Limited evaluation of the spinal canal and for residual tumor on this unenhanced CT.   11/27/2014 Imaging CT chest- Right infrahilar mass obstructs the bronchus intermedius causing postobstructive collapse/consolidation in the right middle and right lower lobes.  Together with right hilar/ipsilateral mediastinal/subcarinal adenopathy and known osseous metas   12/05/2014 -  Chemotherapy Carboplatin/Gemcitabine D1, 8 every 21 days.   12/12/2014 Advanced Directive DNR in place   01/05/2015 -  Hospital Admission Failure to thrive   01/06/2015 Imaging MRI brain- Acute nonhemorrhagic infarcts in the posterior right cerebral hemisphere, posterior watershed distribution. 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   01/07/2015 Imaging US carotid- Long segment occlusion of the left common and internal carotid arteries. 2. Diffuse plaque in the right carotid system resulting in less than 50% diameter stenosis. The exam does not exclude plaque ulceration or embolization.     The patient reports that he has 50% good days and 50% bad days.  He denies any new complaints.  He notes left UE weakness that is noted on exam.  His weight is relatively stable since starting therapy.  He denies any nausea or vomiting.  He is not interested in discontinuing systemic chemotherapy at this time.  He admits that he is tolerating therapy well without any complaints associated with chemotherapy.  Overall, he reports significant improvement in his clinical status/situation.  If able, he would like to continue to pursue palliative treatment.  He denies any acute complaints today.  PAST MEDICAL HISTORY:   Past Medical History  Diagnosis Date  . COPD (chronic obstructive pulmonary disease) ([place])   . Hypertension   . Stroke ([place])   . Cancer ([place])   . Carotid artery disease ([place])   . DNR (do not resuscitate) 12/12/2014  . Squamous cell carcinoma of lung, stage IV ([place]) 12/02/2014    Squamous Cell Histology   . Stomach  cancer ([place])   . Stomach cancer ([place])     ALLERGIES: No Known Allergies    MEDICATIONS: I have reviewed the patient's current medications.     PAST SURGICAL HISTORY History reviewed. No pertinent past surgical  history.  FAMILY HISTORY: Family History  Problem Relation Age of Onset  . Cancer Mother   . Cancer Father     SOCIAL HISTORY:  reports that he has been smoking.  He has never used smokeless tobacco. He reports that he does not drink alcohol or use illicit drugs.  PERFORMANCE STATUS: The patient's performance status is 2 - Symptomatic, <50% confined to bed  PHYSICAL EXAM: Most Recent Vital Signs: Blood pressure 135/72, pulse 78, temperature 98.4 F (36.9 C), temperature source Oral, resp. rate 18, height 5' 6"  (1.676 m), weight 102 lb (46.267 kg), SpO2 99 %. General appearance: alert, cooperative, appears older than stated age, cachectic, no distress and in hospital bed without any family present. Head: Normocephalic, without obvious abnormality, atraumatic, left temporal indentation secondary to significant surgical resection of skin malignancy Throat: normal findings: lips normal without lesions, buccal mucosa normal and tongue midline and normal Neck: supple, symmetrical, trachea midline Lungs: diminished breath sounds bilaterally Heart: regular rate and rhythm Extremities: L UE weakness with hand grip, extension of shoulder, flexion/extension of olecranon.  LE strength is strong and symmetrical Skin: Skin color, texture, turgor normal. No rashes or lesions Neurologic: Grossly normal  LABORATORY DATA:  Results for orders placed or performed during the hospital encounter of 01/05/15 (from the past 48 hour(s))  CBC     Status: Abnormal   Collection Time: 01/06/15  7:16 AM  Result Value Ref Range   WBC 6.6 4.0 - 10.5 K/uL   RBC 3.04 (L) 4.22 - 5.81 MIL/uL   Hemoglobin 9.3 (L) 13.0 - 17.0 g/dL   HCT 27.2 (L) 39.0 - 52.0 %   MCV 89.5 78.0 - 100.0 fL   MCH 30.6 26.0 - 34.0 pg   MCHC 34.2 30.0 - 36.0 g/dL   RDW 14.8 11.5 - 15.5 %   Platelets 89 (L) 150 - 400 K/uL    Comment: SPECIMEN CHECKED FOR CLOTS PLATELET COUNT CONFIRMED BY SMEAR   Comprehensive metabolic panel      Status: Abnormal   Collection Time: 01/06/15  7:16 AM  Result Value Ref Range   Sodium 135 135 - 145 mmol/L   Potassium 4.5 3.5 - 5.1 mmol/L    Comment: DELTA CHECK NOTED   Chloride 106 101 - 111 mmol/L   CO2 22 22 - 32 mmol/L   Glucose, Bld 113 (H) 65 - 99 mg/dL   BUN 19 6 - 20 mg/dL   Creatinine, Ser 0.89 0.61 - 1.24 mg/dL   Calcium 7.4 (L) 8.9 - 10.3 mg/dL   Total Protein 5.6 (L) 6.5 - 8.1 g/dL   Albumin 2.9 (L) 3.5 - 5.0 g/dL   AST 34 15 - 41 U/L   ALT 24 17 - 63 U/L   Alkaline Phosphatase 94 38 - 126 U/L   Total Bilirubin 0.5 0.3 - 1.2 mg/dL   GFR calc non Af Amer >60 >60 mL/min   GFR calc Af Amer >60 >60 mL/min    Comment: (NOTE) The eGFR has been calculated using the CKD EPI equation. This calculation has not been validated in all clinical situations. eGFR's persistently <60 mL/min signify possible Chronic Kidney Disease.    Anion gap 7 5 - 15  Lipid panel     Status: Abnormal  Collection Time: 01/06/15  7:16 AM  Result Value Ref Range   Cholesterol 178 0 - 200 mg/dL   Triglycerides 88 <150 mg/dL   HDL 53 >40 mg/dL   Total CHOL/HDL Ratio 3.4 RATIO   VLDL 18 0 - 40 mg/dL   LDL Cholesterol 107 (H) 0 - 99 mg/dL    Comment:        Total Cholesterol/HDL:CHD Risk Coronary Heart Disease Risk Table                     Men   Women  1/2 Average Risk   3.4   3.3  Average Risk       5.0   4.4  2 X Average Risk   9.6   7.1  3 X Average Risk  23.4   11.0        Use the calculated Patient Ratio above and the CHD Risk Table to determine the patient's CHD Risk.        ATP III CLASSIFICATION (LDL):  <100     mg/dL   Optimal  100-129  mg/dL   Near or Above                    Optimal  130-159  mg/dL   Borderline  160-189  mg/dL   High  >190     mg/dL   Very High   Hemoglobin A1c     Status: Abnormal   Collection Time: 01/06/15  7:16 AM  Result Value Ref Range   Hgb A1c MFr Bld 6.9 (H) 4.8 - 5.6 %    Comment: (NOTE)         Pre-diabetes: 5.7 - 6.4         Diabetes:  >6.4         Glycemic control for adults with diabetes: <7.0    Mean Plasma Glucose 151 mg/dL    Comment: (NOTE) Performed At: [facility] [place], [place] [id number] Lindon Romp MD TU:[id number]   CBC     Status: Abnormal   Collection Time: 01/07/15  6:24 AM  Result Value Ref Range   WBC 4.6 4.0 - 10.5 K/uL   RBC 3.04 (L) 4.22 - 5.81 MIL/uL   Hemoglobin 9.2 (L) 13.0 - 17.0 g/dL   HCT 27.1 (L) 39.0 - 52.0 %   MCV 89.1 78.0 - 100.0 fL   MCH 30.3 26.0 - 34.0 pg   MCHC 33.9 30.0 - 36.0 g/dL   RDW 15.4 11.5 - 15.5 %   Platelets 69 (L) 150 - 400 K/uL    Comment: SPECIMEN CHECKED FOR CLOTS CONSISTENT WITH PREVIOUS RESULT   Basic metabolic panel     Status: Abnormal   Collection Time: 01/07/15  6:24 AM  Result Value Ref Range   Sodium 136 135 - 145 mmol/L   Potassium 4.3 3.5 - 5.1 mmol/L   Chloride 107 101 - 111 mmol/L   CO2 21 (L) 22 - 32 mmol/L   Glucose, Bld 107 (H) 65 - 99 mg/dL   BUN 16 6 - 20 mg/dL   Creatinine, Ser 0.85 0.61 - 1.24 mg/dL   Calcium 7.7 (L) 8.9 - 10.3 mg/dL   GFR calc non Af Amer >60 >60 mL/min   GFR calc Af Amer >60 >60 mL/min    Comment: (NOTE) The eGFR has been calculated using the CKD EPI equation. This calculation has not been validated in all clinical situations. eGFR's persistently <60 mL/min  signify possible Chronic Kidney Disease.    Anion gap 8 5 - 15      RADIOGRAPHY: Mr Virgel Paling Wo Contrast  01/06/2015  CLINICAL DATA:  Left-sided weakness and confusion.  Acute infarct. EXAM: MRA HEAD WITHOUT CONTRAST TECHNIQUE: Angiographic images of the Circle of Willis were obtained using MRA technique without intravenous contrast. COMPARISON:  None. FINDINGS: Dominant right vertebral artery with no visible flow in the distal left vertebral artery, chronic based on 10/21/2014 head CT with contrast. There is a dominant left AICA with nonvisualized left PICA. Well opacified right PICA. Diffuse atherosclerotic irregularity at the  basilar artery which is tortuous, without flow limiting stenosis. Patent superior cerebellar arteries. Focal high-grade right P2-P3 segment stenosis. High-grade left P4 branch stenosis. Chronically occluded left carotid artery with collapsed and calcified lumen on CT 10/21/2014. There is underfilling of a left-sided ACA and MCA branches. Although there is a left posterior communicating artery, is not appeared to significantly ram outlined left MCA flow. There is extensive atherosclerotic disease of the right carotid with tandem high-grade stenoses of the right cavernous carotid with flow gap at the more distal stenosis. Diffuse atherosclerotic irregularity of right MCA branches. No evidence of aneurysm. IMPRESSION: 1. Most pertinent to acute watershed territory infarcts on the right, there are high-grade stenoses of the right cavernous carotid and right P2-P3 segment. 2. Chronic left ICA occlusion and underfilled left ACA and MCA vessels. 3. 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 Electronically Signed   By: Monte Fantasia M.D.   On: 01/06/2015 15:27   Mr Jeri Cos AO Contrast  01/06/2015  CLINICAL DATA:  Left-sided weakness with confusion. History of colon cancer. EXAM: MRI HEAD WITHOUT AND WITH CONTRAST TECHNIQUE: Multiplanar, multiecho pulse sequences of the brain and surrounding structures were obtained without and with intravenous contrast. CONTRAST:  98m MULTIHANCE GADOBENATE DIMEGLUMINE 529 MG/ML IV SOLN COMPARISON:  Head CT with and without contrast 10/21/2014 FINDINGS: Calvarium and upper cervical spine: Soft tissue loss and scarring in the left temporal fossa. No underlying marrow signal abnormality. Orbits: No significant findings. Sinuses and Mastoids: Clear. Brain: There is multi focal cortical and subcortical restricted diffusion in the posterior right cerebral hemisphere, roughly aligned along the posterior border zone/watershed territory. No hemorrhagic  conversion or shift. There is a remote infarct of the left posterior cerebral hemisphere with similar pattern. Chronic small-vessel disease, asymmetric to the left cerebral hemisphere, correlating with chronic occlusion of the left ICA. No acute major vessel occlusion is identified. Motion graded postcontrast imaging, nondiagnostic in this patient with history of malignancy. Patient is currently admitted. IMPRESSION: 1. Acute nonhemorrhagic infarcts in the posterior right cerebral hemisphere, posterior watershed distribution. 2. 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 3. Nondiagnostic postcontrast imaging due to motion. Electronically Signed   By: JMonte FantasiaM.D.   On: 01/06/2015 15:10   Mr Cervical Spine W Wo Contrast  01/06/2015  CLINICAL DATA:  Left-sided weakness with confusion. Colon cancer history. EXAM: MRI CERVICAL SPINE WITHOUT AND WITH CONTRAST TECHNIQUE: Multiplanar and multiecho pulse sequences of the cervical spine, to include the craniocervical junction and cervicothoracic junction, were obtained according to standard protocol without and with intravenous contrast. CONTRAST:  912mMULTIHANCE GADOBENATE DIMEGLUMINE 529 MG/ML IV SOLN COMPARISON:  None. FINDINGS: Severely motion degraded study - all axial sequences are nondiagnostic. Heavily relying on sagittal T1 pre/postcontrast and sagittal STIR imaging there is no evidence of metastatic disease, discitis, or cord lesion.  Disc degeneration with notable central protrusion at C3-4 that combines with dorsal ligamentous buckling to cause mild ventral cord mass effect. There is ligamentum flavum thickening and buckling at C5-6 with moderate canal stenosis and circumferential CSF effacement. IMPRESSION: 1. Motion degraded study, incomplete due to nondiagnostic axial imaging. 2. No evidence of metastatic disease or cord lesion. 3. 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 Electronically Signed    By: Monte Fantasia M.D.   On: 01/06/2015 15:18   US Carotid Bilateral  01/07/2015  CLINICAL DATA:  Stroke, hypertension, syncope, previous tobacco abuse EXAM: BILATERAL CAROTID DUPLEX ULTRASOUND TECHNIQUE: Pearline Cables scale imaging, color Doppler and duplex ultrasound was performed of bilateral carotid and vertebral arteries in the neck. COMPARISON:  CT 11/27/2014, ultrasound 12/ 12 2002 by report only REVIEW OF SYSTEMS: Quantification of carotid stenosis is based on velocity parameters that correlate the residual internal carotid diameter with NASCET-based stenosis levels, using the diameter of the distal internal carotid lumen as the denominator for stenosis measurement. The following velocity measurements were obtained: PEAK SYSTOLIC/END DIASTOLIC RIGHT ICA:                     85/24cm/sec CCA:                     025/85ID/POE SYSTOLIC ICA/CCA RATIO:  0.5 DIASTOLIC ICA/CCA RATIO: 0.9 ECA:                     175cm/sec LEFT ICA:                     Occluded CCA:                     Occluded SYSTOLIC ICA/CCA RATIO:  N/a DIASTOLIC ICA/CCA RATIO: N/a ECA:                     Occluded FINDINGS: RIGHT CAROTID ARTERY: Circumferential plaque through the common carotid artery, with focal areas of calcification, no high-grade stenosis. There is circumferential eccentric plaque effacing the carotid bulb and extending into the proximal ICA resulting in at least mild stenosis. Normal waveforms and color Doppler signal. RIGHT VERTEBRAL ARTERY:  Normal flow direction and waveform. LEFT CAROTID ARTERY: Long segment occlusion of the common carotid, bulb, and ICA. This was partially visible on prior chest CT of 11/26/2014. There is collateral reconstitution of the external carotid artery with low resistance waveform. LEFT VERTEBRAL ARTERY: Normal flow direction and waveform. IMPRESSION: 1. 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 2. Diffuse plaque in the right carotid system resulting in less than 50%  diameter stenosis. The exam does not exclude plaque ulceration or embolization. Continued surveillance recommended. 3. Bilateral antegrade vertebral arterial flow. Electronically Signed   By: Lucrezia Europe M.D.   On: 01/07/2015 15:36       PATHOLOGY:  Nothing new   ASSESSMENT/PLAN:   Mr. Mulvehill has Stage IV NSCLC.  He is a DNR.  He understands that all treatment is palliative in nature and the fact that his disease is not curable.  As a result of his goals of care, he is on a well tolerated regimen of systemic chemotherapy consisting of Carboplatin/Gemcitabine in a day 1, and 8 every 21 day fashion.  This began on 12/05/2014.  He is not having any intolerance or toxicities associated with treatment at this time.  He is very realistic regarding his cancer care.  Anyone with an ECOG performance status of 2 or less, is a candidate for systemic treatment.  Mr Makarewicz continues to have a performance status of 2.  Moving forward, if decline in performance status and/or progression of disease is identified, he would be appropriate for Hospice, unless other treatment options are presented to the patient (ie immunotherapy).  This would depend on his performance status and clinical scenario at that time.  In July 2016, his weight was 130 lbs.  He began treatment on 12/05/2014 and at that time, his weight was 107 lbs.  On 01/05/2015, his documented weight was 102 lbs.  With this in mind, since starting chemotherapy, his weight is relatively stable.  Of note, when we first met the patient, he was nonambulatory and his pain was poorly controlled.  Since treatment began, he is more ambulatory and has been reporting to the clinic without wheelchair assistance.  His pain is also better with treatment.  He has been evaluated by Rad Onc for consideration of palliative radiation to vertebral lesion to help with pain control and assist with improved healing.  MRI of brain demonstrates a right cerebral hemisphere with resulting  left UE weakness.  Concern regarding antithrombotic therapy in the setting of thrombocytopenia is unwarranted.  Would recommend ASA 81 mg daily.  Order is placed while inpatient and this can be continued as an outpatient.  Based upon the timing of last cycle of chemotherapy, his counts are nadiring appropriately, in particular platelet count.  Progressive thrombocytopenia is appreciated and does not require any intervention.  ASA in this setting is not contraindicated.  On exam, left UE weakness is noted.  Additionally left temporal area demonstrates indentation from intense surgical resection of skin malignancy in past.  He has an outpatient appointment at the [facility] on 12/1 at 9:30 AM for follow-up and to embark on cycle #3 of treatment.  We will see him in the clinic as scheduled.  Depending on his progress, we may need to hold treatment an additional week.  If recovered, we will continue with systemic chemotherapy as planned in a palliative setting.  All questions were answered. The patient knows to call the clinic with any problems, questions or concerns. We can certainly see the patient much sooner if necessary.  Patient and plan discussed with Dr. Ancil Linsey and she is in agreement with the aforementioned.   KEFALAS,THOMAS  01/07/2015 6:45 PM  Patient was doing well with therapy, fortunately he and is family understand his prognosis and goals of therapy.  Moving forward his PS will be critical. Will need to see how he recovers from recent CVA. If it is deemed he needs to transition to hospice there will be little resistance from patient as he has desired quality as his principal goal. Will continue to re-eval moving forward. Other details as documented above. Donald Pore MD

## 2015-01-07 NOTE — Progress Notes (Signed)
*  PRELIMINARY RESULTS* Echocardiogram 2D Echocardiogram has been performed.  Edward Duran 01/07/2015, 4:27 PM

## 2015-01-07 NOTE — Progress Notes (Signed)
Patient ID: YOSEPH HAILE, male   DOB: 1955-06-15, 59 y.o.   MRN: [id number]  Sheyenne A. Merlene Laughter, MD     [email or web address]          MONICO SUDDUTH is an 59 y.o. male.   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  Significant thrombocytopenia.   Vascular dementia.  Stage V metastatic lung disease.  RECOMMENDATION: Dual antiplatelet agent for 3 months. Subsequently, the patient should be on a single antiplatelet agent. ????? Being held due thrombocytopenia.  Dementia labs. Agree with statin.    He has no complaints today. Seems more coherent.   GENERAL: No acute distress  HEENT: Supple. Atraumatic normocephalic.   ABDOMEN: soft  EXTREMITIES: No edema   BACK: Normal.  SKIN: Normal by inspection.   MENTAL STATUS: The patient is awake and alert. He is oriented person and [employer]. More lucid and coherent today. Follows commands briskly.  CRANIAL NERVES: Pupils are equal, round and reactive to light and accommodation; extra ocular movements are full, there is no significant nystagmus; visual fields are full; upper and lower facial muscles are normal in strength and symmetric, there is no flattening of the nasolabial folds; tongue is midline; uvula is midline; shoulder elevation is normal.  MOTOR: Normal tone, bulk and strength- R; LUE pronator drift. No drift of the legs. LUE 3/5; LLU 5/5  COORDINATION: Left finger to nose is normal, right finger to nose is normal, No rest tremor; no intention tremor; no postural tremor; no bradykinesia.  REFLEXES: Deep tendon reflexes are symmetrical and normal, but brisk in the legs. Babinski reflexes are extensor bilaterally.   SENSATION: Normal to light touch. The patient extinguishes to double some nystagmus stimulation on the left side and to visual stimulation.          Objective: Vital signs in last 24 hours: Temp:  [98.4 F (36.9 C)-98.5 F (36.9 C)] 98.4 F (36.9 C) (11/22 1400) Pulse Rate:  [78-82] 78 (11/22 1400) Resp:  [18] 18 (11/22 1400) BP: (135-154)/(68-72) 135/72 mmHg (11/22 1400) SpO2:  [96 %-100 %] 99 % (11/22 1400)  Intake/Output from previous day: 11/21 0701 - 11/22 0700 In: 600 [P.O.:600] Out: 650 [Urine:650] Intake/Output this shift: Total I/O In: 3105 [P.O.:480; I.V.:2625] Out: 250 [Urine:250] Nutritional status: Diet regular Room service appropriate?: Yes; Fluid consistency:: Thin   Lab Results: Results for orders placed or performed during the hospital encounter of 01/05/15 (from the past 48 hour(s))  CBC     Status: Abnormal   Collection Time: 01/06/15  7:16 AM  Result Value Ref Range   WBC 6.6 4.0 - 10.5 K/uL   RBC 3.04 (L) 4.22 - 5.81 MIL/uL   Hemoglobin 9.3 (L) 13.0 - 17.0 g/dL   HCT 27.2 (L) 39.0 - 52.0 %   MCV 89.5 78.0 - 100.0 fL   MCH 30.6 26.0 - 34.0 pg   MCHC 34.2 30.0 - 36.0 g/dL   RDW 14.8 11.5 - 15.5 %   Platelets 89 (L) 150 - 400 K/uL    Comment: SPECIMEN CHECKED FOR CLOTS PLATELET COUNT CONFIRMED BY SMEAR   Comprehensive metabolic panel     Status: Abnormal   Collection Time: 01/06/15  7:16 AM  Result Value Ref Range   Sodium 135 135 - 145 mmol/L   Potassium 4.5 3.5 - 5.1 mmol/L    Comment: DELTA CHECK NOTED   Chloride 106  101 - 111 mmol/L   CO2 22 22 - 32 mmol/L   Glucose, Bld 113 (H) 65 - 99 mg/dL   BUN 19 6 - 20 mg/dL   Creatinine, Ser 0.89 0.61 - 1.24 mg/dL   Calcium 7.4 (L) 8.9 - 10.3 mg/dL   Total Protein 5.6 (L) 6.5 - 8.1 g/dL   Albumin 2.9 (L) 3.5 - 5.0 g/dL   AST 34 15 - 41 U/L   ALT 24 17 - 63 U/L   Alkaline Phosphatase 94 38 - 126 U/L   Total Bilirubin 0.5 0.3 - 1.2 mg/dL   GFR calc non Af Amer >60 >60 mL/min   GFR calc Af Amer >60 >60 mL/min    Comment: (NOTE) The eGFR has been calculated using the CKD EPI equation. This calculation has not been validated in all  clinical situations. eGFR's persistently <60 mL/min signify possible Chronic Kidney Disease.    Anion gap 7 5 - 15  Lipid panel     Status: Abnormal   Collection Time: 01/06/15  7:16 AM  Result Value Ref Range   Cholesterol 178 0 - 200 mg/dL   Triglycerides 88 <150 mg/dL   HDL 53 >40 mg/dL   Total CHOL/HDL Ratio 3.4 RATIO   VLDL 18 0 - 40 mg/dL   LDL Cholesterol 107 (H) 0 - 99 mg/dL    Comment:        Total Cholesterol/HDL:CHD Risk Coronary Heart Disease Risk Table                     Men   Women  1/2 Average Risk   3.4   3.3  Average Risk       5.0   4.4  2 X Average Risk   9.6   7.1  3 X Average Risk  23.4   11.0        Use the calculated Patient Ratio above and the CHD Risk Table to determine the patient's CHD Risk.        ATP III CLASSIFICATION (LDL):  <100     mg/dL   Optimal  100-129  mg/dL   Near or Above                    Optimal  130-159  mg/dL   Borderline  160-189  mg/dL   High  >190     mg/dL   Very High   Hemoglobin A1c     Status: Abnormal   Collection Time: 01/06/15  7:16 AM  Result Value Ref Range   Hgb A1c MFr Bld 6.9 (H) 4.8 - 5.6 %    Comment: (NOTE)         Pre-diabetes: 5.7 - 6.4         Diabetes: >6.4         Glycemic control for adults with diabetes: <7.0    Mean Plasma Glucose 151 mg/dL    Comment: (NOTE) Performed At: [facility] [place], [place] [id number] Lindon Romp MD NG:[id number]   CBC     Status: Abnormal   Collection Time: 01/07/15  6:24 AM  Result Value Ref Range   WBC 4.6 4.0 - 10.5 K/uL   RBC 3.04 (L) 4.22 - 5.81 MIL/uL   Hemoglobin 9.2 (L) 13.0 - 17.0 g/dL   HCT 27.1 (L) 39.0 - 52.0 %   MCV 89.1 78.0 - 100.0 fL   MCH 30.3 26.0 - 34.0 pg   MCHC 33.9  30.0 - 36.0 g/dL   RDW 15.4 11.5 - 15.5 %   Platelets 69 (L) 150 - 400 K/uL    Comment: SPECIMEN CHECKED FOR CLOTS CONSISTENT WITH PREVIOUS RESULT   Basic metabolic panel     Status: Abnormal   Collection Time: 01/07/15  6:24 AM  Result  Value Ref Range   Sodium 136 135 - 145 mmol/L   Potassium 4.3 3.5 - 5.1 mmol/L   Chloride 107 101 - 111 mmol/L   CO2 21 (L) 22 - 32 mmol/L   Glucose, Bld 107 (H) 65 - 99 mg/dL   BUN 16 6 - 20 mg/dL   Creatinine, Ser 0.85 0.61 - 1.24 mg/dL   Calcium 7.7 (L) 8.9 - 10.3 mg/dL   GFR calc non Af Amer >60 >60 mL/min   GFR calc Af Amer >60 >60 mL/min    Comment: (NOTE) The eGFR has been calculated using the CKD EPI equation. This calculation has not been validated in all clinical situations. eGFR's persistently <60 mL/min signify possible Chronic Kidney Disease.    Anion gap 8 5 - 15    Lipid Panel  Recent Labs  01/06/15 0716  CHOL 178  TRIG 88  HDL 53  CHOLHDL 3.4  VLDL 18  LDLCALC 107*    Studies/Results:  CAROTID DOPPLER IMPRESSION: 1. 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 2. Diffuse plaque in the right carotid system resulting in less than 50% diameter stenosis. The exam does not exclude plaque ulceration or embolization. Continued surveillance recommended. 3. Bilateral antegrade vertebral arterial flow.    ECHO  Left ventricle: The cavity size was normal. Wall thickness was normal. Systolic function was normal. The estimated ejection fraction was in the range of 55% to 60%. The study is not technically sufficient to allow evaluation of LV diastolic function. - Aortic valve: Poorly visualized. Moderately calcified annulus. Trileaflet; moderately thickened leaflets. Grossly there appears to be no significant stenosis or regurgitation. - Mitral valve: Poorly visualized. Mildly calcified annulus. Mildly thickened leaflets . There was mild regurgitation. - Left atrium: The atrium was mildly dilated. - Right ventricle: Poorly visualized. Grossly appears normal in size and fucntion. - Pulmonary arteries: PA peak pressure: 30 mm Hg (S). PASP is borderline elevated. - Technically difficult study.  Medications:   Scheduled Meds: . atorvastatin  10 mg Oral q1800  . budesonide-formoterol  2 puff Inhalation BID  . feeding supplement (ENSURE ENLIVE)  237 mL Oral BID BM  . irbesartan  150 mg Oral Daily   And  . hydrochlorothiazide  12.5 mg Oral Daily  . morphine  15 mg Oral Q12H  . potassium chloride SA  20 mEq Oral BID   Continuous Infusions: . sodium chloride 75 mL/hr at 01/07/15 0224   PRN Meds:.ALPRAZolam, alum & mag hydroxide-simeth, ipratropium-albuterol, methocarbamol, morphine injection, ondansetron **OR** ondansetron (ZOFRAN) IV, polyethylene glycol     LOS: 1 day   Keora Eccleston A. Merlene Laughter, M.D.  Diplomate, Tax adviser of Psychiatry and Neurology ( Neurology).

## 2015-01-07 NOTE — Progress Notes (Signed)
Initial Nutrition Assessment  DOCUMENTATION CODES:  Severe malnutrition in context of chronic illness, Underweight   Pt meets criteria for SEVERE MALNUTRITION in the context of CHRONIC ILLNESS as evidenced by Loss of 20% bw in 4 months, an intake of < or equal to 75% of estimated needs for > or equal to 1 month, and severe fat wasting.  INTERVENTION:  While inpatient: Ensure Enlive po BID, each supplement provides 350 kcal and 20 grams of protein  Gave education on Taste changes, high protein foods, and staying hydrated with associated handouts "Soft and Moist High Protein Menu Ideas" and "Taste and Smell Changes"  Gave Ensure/Boost Samples + Coupons   NUTRITION DIAGNOSIS:  Increased nutrient needs related to cancer and cancer related treatments as evidenced by loss of >20% bw in 4 months  GOAL:  Patient will meet greater than or equal to 90% of their needs  MONITOR:  PO intake, Supplement acceptance, Weight trends, Labs  REASON FOR ASSESSMENT:  Malnutrition Screening Tool    ASSESSMENT:  58 y/o male  PMHx  Stage 4 lung cancer (followed by dietitian as outpatient), COPD, HTN, [place], CAD. He presents with a 48 hr hx of anorexia, weakness and confusion. Dehydrated.  Spoke with patient and relative. Patient reports that he eats 1 meal/snack a day and <1 boost/ensure supplement.  The foods he eat at his meal vary, but he states he does eat a source of protein: PB, eggs, meat etc. He has not had an appetite overall. He has had 4 chemo treatments thus far. Relative reports that typically 48 hrs after chemo he will get very sick. This includes nausea, fatigue, and over all malaise. This lasts for 2-3 days and then he gets back to baseline.   Excluding those 2-3 days, pt has minimal symptoms. He does say he has lost much of his taste and all foods are bland. Went over some recommendations for bringing flavor back to food. Gave associated handouts on taste change. Pt was dehydrated on  admission. Gave recommendations to stay hydrated.   Encouraged pt to try his best to eat. He is essentially eating <2 meals a day and, due to his increased calorie/protein needs, he should be eating 2x that much. Encouraged to try supplements again. Gave coupons and samples. If he is really struggling with forcing food down, advised to discuss appetite stimulant at next oncology appointment, though told him that these come with side effects and are typically regarded as last resort.   He has lost 5 more lbs since I first spoke with him 1 month ago. He has lost ~ 30 lbs in the last 4 months.   NFPE: Severe fat wasting, mild-mod muscle wasting.   Diet Order:  Diet regular Room service appropriate?: Yes; Fluid consistency:: Thin  Skin:  Reviewed, no issues  Last BM:  11/17  Height:  Ht Readings from Last 1 Encounters:  01/05/15 '5\' 6"'$  (1.676 m)   Weight:  Wt Readings from Last 1 Encounters:  01/05/15 102 lb (46.267 kg)   Wt Readings from Last 10 Encounters:  01/05/15 102 lb (46.267 kg)  01/02/15 102 lb 9.6 oz (46.539 kg)  12/26/14 102 lb 12.8 oz (46.63 kg)  12/12/14 106 lb 11.2 oz (48.399 kg)  12/05/14 107 lb 14.4 oz (48.943 kg)  11/21/14 112 lb 3.2 oz (50.894 kg)  10/30/14 123 lb 10.9 oz (56.1 kg)  10/23/14 126 lb 15.8 oz (57.6 kg)  09/10/14 130 lb (58.968 kg)   Ideal Body Weight:  64.55 kg  BMI:  Body mass index is 16.47 kg/(m^2).  Estimated Nutritional Needs:  Kcal:  1600-1850 kcals (35-40 kcal/kg) Protein:  70-93 (1.5-2 g/kg bw) Fluid:  1.6 liters  EDUCATION NEEDS:  Education needs addressed  Burtis Junes RD, LDN Nutrition Pager: [phone] 01/07/2015 11:15 AM

## 2015-01-07 NOTE — Progress Notes (Signed)
[facility] PROGRESS NOTE  Edward Duran:[id number] DOB: 26-Apr-1955 DOA: 01/05/2015 PCP: No PCP Per Patient Summary  51 yom with history of metastatic lung cancer stage IV presented with complaints of dehydration, weakness, and anorexia. Respiratory status improved in ED with one dose of steroids, continue neb treatments PRN. His overall condition has declined and he is failing to thrive. Oncology has been consulted, to address further cancer treatment and see if he is a candidate for hospice at this point.  MRI brain revealed acute nonhemorrhagic infarcts in the posterior right cerebral hemisphere, neurology following. Anticipate discharge in 1-2 days.   Assessment/Plan: 1. Failure to thrive since starting chemotherapy. Family report he has lost appx 40 pounds in the last two months and he has "good days" and "bad days." Reports being able to eat without difficulty today. Nutrition is following.  2. Dehydration, secondary to FTT. Improving, will continue IVF. 3. Anemia of chronic disease, likely related to underlying malignancy. Stable. No evidence of bleeding.  4. COPD. Pt reportedly SOB on admission. He received neb treatments and one dose of steroids. Respiratory status appears to have returned to baseline. Family reports he may not be using his bronchodilators as prescribed. Breathing treatments PRN. 5. HTN. Continue home medications.  6. Chronic pain. Continue pain medication regimen.  7. Thrombocytopenia. Likely related to recent chemo. Continue to monitor.  8. Metastatic lung cancer stage IV on palliative chemotherapy. Last chemo 11/17. Followed by Dr. Whitney Muse, oncologist. He has been referred to radiation oncology for symptom management in his lumbar spine. His overall conditions continues to decline. He seems appropriate for hospice referral. Oncology consult has been requested to help further discuss goals of care.  9. Acute CVA. MRI brain revealed acute nonhemorrhagic infarcts  in the posterior right cerebral hemisphere, posterior watershed distribution. Appreciate neurology recommendations. I am reluctant to start hm on antithrombotic therapy, due to his worsening platelet count and high risk of bleeding. Will start patient on statin for elevated LDL. Echo and carotid doppler are in process.  10. Severe malnutrition in the context of chronic illness. Nutrition following.   Code Status: DNR DVT prophylaxis: SCDs Family Communication: Discussed with patient who understands and has no concerns at this time.  Disposition Plan: Anticipate discharge within 1-2 days   Consultants:  Neurology   Nutrition   Procedures:    Antibiotics:    HPI/Subjective: Feeling fine. Was able to eat without difficulty.    Objective: Filed Vitals:   01/06/15 2233 01/07/15 0534  BP: 154/68 138/71  Pulse: 80 82  Temp: 98.5 F (36.9 C) 98.5 F (36.9 C)  Resp: 18 18    Intake/Output Summary (Last 24 hours) at 01/07/15 0809 Last data filed at 01/06/15 2233  Gross per 24 hour  Intake    600 ml  Output    300 ml  Net    300 ml   Filed Weights   01/05/15 1052 01/05/15 1632  Weight: 46.267 kg (102 lb) 46.267 kg (102 lb)    Exam: General:  Appears comfortable. Cachectic. Temporal wasting noted.   Cardiovascular: RRR.  Respiratory: CTAB. No wheezing, rales or rhonchi. Normal respiratory effort. Abdomen: soft, ntnd Musculoskeletal: no LE edema.  Data Reviewed: Basic Metabolic Panel:  Recent Labs Lab 01/02/15 0934 01/05/15 1225 01/06/15 0716 01/07/15 0624  NA 134* 135 135 136  K 3.7 3.6 4.5 4.3  CL 104 99* 106 107  CO2 '24 23 22 '$ 21*  GLUCOSE 160* 101* 113* 107*  BUN 14 18  19 16  CREATININE 0.93 1.00 0.89 0.85  CALCIUM 8.0* 8.4* 7.4* 7.7*   Liver Function Tests:  Recent Labs Lab 01/02/15 0934 01/05/15 1225 01/06/15 0716  AST 23 47* 34  ALT '20 28 24  '$ ALKPHOS 120 130* 94  BILITOT 0.5 0.7 0.5  PROT 6.8 7.3 5.6*  ALBUMIN 3.4* 3.7 2.9*    CBC:  Recent Labs Lab 01/02/15 0934 01/05/15 1225 01/06/15 0716 01/07/15 0624  WBC 4.7 7.9 6.6 4.6  NEUTROABS 3.2 7.0  --   --   HGB 11.4* 11.0* 9.3* 9.2*  HCT 33.5* 32.4* 27.2* 27.1*  MCV 89.1 89.5 89.5 89.1  PLT 220 112* 89* 69*    Studies: Dg Chest 1 View  01/05/2015  CLINICAL DATA:  Cough. Gastric cancer. Right lower lobe lung cancer. EXAM: CHEST 1 VIEW COMPARISON:  10/24/2014 chest radiograph. FINDINGS: Stable cardiomediastinal silhouette with port normal heart size and right hilar prominence. No pneumothorax. Stable small right pleural effusion. No left pleural effusion. Stable volume loss and patchy consolidation in the right middle and right lower lobes. IMPRESSION: 1. Stable volume loss and patchy consolidation in the right middle and right lower lobes, in keeping with postobstructive atelectasis/pneumonia from known obstructing central right lower lung mass. 2. Stable small right pleural effusion. Electronically Signed   By: Ilona Sorrel M.D.   On: 01/05/2015 17:40   Mr Edward Duran Wo Contrast  01/06/2015  CLINICAL DATA:  Left-sided weakness and confusion.  Acute infarct. EXAM: MRA Duran WITHOUT CONTRAST TECHNIQUE: Angiographic images of the Circle of Willis were obtained using MRA technique without intravenous contrast. COMPARISON:  None. FINDINGS: Dominant right vertebral artery with no visible flow in the distal left vertebral artery, chronic based on 10/21/2014 Duran CT with contrast. There is a dominant left AICA with nonvisualized left PICA. Well opacified right PICA. Diffuse atherosclerotic irregularity at the basilar artery which is tortuous, without flow limiting stenosis. Patent superior cerebellar arteries. Focal high-grade right P2-P3 segment stenosis. High-grade left P4 branch stenosis. Chronically occluded left carotid artery with collapsed and calcified lumen on CT 10/21/2014. There is underfilling of a left-sided ACA and MCA branches. Although there is a left posterior  communicating artery, is not appeared to significantly ram outlined left MCA flow. There is extensive atherosclerotic disease of the right carotid with tandem high-grade stenoses of the right cavernous carotid with flow gap at the more distal stenosis. Diffuse atherosclerotic irregularity of right MCA branches. No evidence of aneurysm. IMPRESSION: 1. Most pertinent to acute watershed territory infarcts on the right, there are high-grade stenoses of the right cavernous carotid and right P2-P3 segment. 2. Chronic left ICA occlusion and underfilled left ACA and MCA vessels. 3. 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 Electronically Signed   By: Monte Fantasia M.D.   On: 01/06/2015 15:27   Mr Jeri Cos FF Contrast  01/06/2015  CLINICAL DATA:  Left-sided weakness with confusion. History of colon cancer. EXAM: MRI Duran WITHOUT AND WITH CONTRAST TECHNIQUE: Multiplanar, multiecho pulse sequences of the brain and surrounding structures were obtained without and with intravenous contrast. CONTRAST:  50m MULTIHANCE GADOBENATE DIMEGLUMINE 529 MG/ML IV SOLN COMPARISON:  [place] CT with and without contrast 10/21/2014 FINDINGS: Calvarium and upper cervical spine: Soft tissue loss and scarring in the left temporal fossa. No underlying marrow signal abnormality. Orbits: No significant findings. Sinuses and Mastoids: Clear. Brain: There is multi focal cortical and subcortical restricted diffusion in the posterior right cerebral hemisphere, roughly aligned along the posterior  border zone/watershed territory. No hemorrhagic conversion or shift. There is a remote infarct of the left posterior cerebral hemisphere with similar pattern. Chronic small-vessel disease, asymmetric to the left cerebral hemisphere, correlating with chronic occlusion of the left ICA. No acute major vessel occlusion is identified. Motion graded postcontrast imaging, nondiagnostic in this patient with history of  malignancy. Patient is currently admitted. IMPRESSION: 1. Acute nonhemorrhagic infarcts in the posterior right cerebral hemisphere, posterior watershed distribution. 2. 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 3. Nondiagnostic postcontrast imaging due to motion. Electronically Signed   By: Monte Fantasia M.D.   On: 01/06/2015 15:10   Mr Cervical Spine W Wo Contrast  01/06/2015  CLINICAL DATA:  Left-sided weakness with confusion. Colon cancer history. EXAM: MRI CERVICAL SPINE WITHOUT AND WITH CONTRAST TECHNIQUE: Multiplanar and multiecho pulse sequences of the cervical spine, to include the craniocervical junction and cervicothoracic junction, were obtained according to standard protocol without and with intravenous contrast. CONTRAST:  19m MULTIHANCE GADOBENATE DIMEGLUMINE 529 MG/ML IV SOLN COMPARISON:  None. FINDINGS: Severely motion degraded study - all axial sequences are nondiagnostic. Heavily relying on sagittal T1 pre/postcontrast and sagittal STIR imaging there is no evidence of metastatic disease, discitis, or cord lesion. Disc degeneration with notable central protrusion at C3-4 that combines with dorsal ligamentous buckling to cause mild ventral cord mass effect. There is ligamentum flavum thickening and buckling at C5-6 with moderate canal stenosis and circumferential CSF effacement. IMPRESSION: 1. Motion degraded study, incomplete due to nondiagnostic axial imaging. 2. No evidence of metastatic disease or cord lesion. 3. 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 Electronically Signed   By: JMonte FantasiaM.D.   On: 01/06/2015 15:18    Scheduled Meds: . budesonide-formoterol  2 puff Inhalation BID  . feeding supplement (ENSURE ENLIVE)  237 mL Oral BID BM  . irbesartan  150 mg Oral Daily   And  . hydrochlorothiazide  12.5 mg Oral Daily  . morphine  15 mg Oral Q12H  . potassium chloride SA  20 mEq Oral BID   Continuous Infusions: .  sodium chloride 75 mL/hr at 01/07/15 0224    Active Problems:   HTN (hypertension)   COPD (chronic obstructive pulmonary disease) (HCC)   Metastatic lung cancer (metastasis from lung to other site) (HCC)   Dehydration   Thrombocytopenia (HCC)   Anemia, chronic disease   Acute CVA (cerebrovascular accident) ([facility]    Time spent: 233minutes    MKathie Dike MD  [facility] Pager [phone] If 7PM-7AM, please contact night-coverage at [email or web address], password [facility]11/22/2016, 8:09 AM  LOS: 1 day     By signing my name below, I, JRennis Harding attest that this documentation has been prepared under the direction and in the presence of JKathie Dike MD. Electronically signed: JRennis Harding Scribe. 01/07/2015 12:59pm   I, Dr. JKathie Dike personally performed the services described in this documentaiton. All medical record entries made by the scribe were at my direction and in my presence. I have reviewed the chart and agree that the record reflects my personal performance and is accurate and complete  JKathie Dike MD, 01/07/2015 1:06 PM

## 2015-01-08 LAB — CBC
HEMATOCRIT: 27.7 % — AB (ref 39.0–52.0)
HEMOGLOBIN: 9.3 g/dL — AB (ref 13.0–17.0)
MCH: 30.2 pg (ref 26.0–34.0)
MCHC: 33.6 g/dL (ref 30.0–36.0)
MCV: 89.9 fL (ref 78.0–100.0)
Platelets: 45 10*3/uL — ABNORMAL LOW (ref 150–400)
RBC: 3.08 MIL/uL — ABNORMAL LOW (ref 4.22–5.81)
RDW: 15.4 % (ref 11.5–15.5)
WBC: 2.6 10*3/uL — ABNORMAL LOW (ref 4.0–10.5)

## 2015-01-08 LAB — BASIC METABOLIC PANEL
ANION GAP: 4 — AB (ref 5–15)
BUN: 13 mg/dL (ref 6–20)
CALCIUM: 7.8 mg/dL — AB (ref 8.9–10.3)
CO2: 24 mmol/L (ref 22–32)
Chloride: 109 mmol/L (ref 101–111)
Creatinine, Ser: 0.82 mg/dL (ref 0.61–1.24)
Glucose, Bld: 104 mg/dL — ABNORMAL HIGH (ref 65–99)
POTASSIUM: 4.8 mmol/L (ref 3.5–5.1)
SODIUM: 137 mmol/L (ref 135–145)

## 2015-01-08 LAB — VITAMIN B12: VITAMIN B 12: 443 pg/mL (ref 180–914)

## 2015-01-08 MED ORDER — ASPIRIN EC 81 MG PO TBEC
81.0000 mg | DELAYED_RELEASE_TABLET | Freq: Every day | ORAL | Status: DC
  Administered 2015-01-08 – 2015-01-09 (×2): 81 mg via ORAL
  Filled 2015-01-08 (×2): qty 1

## 2015-01-08 NOTE — Care Management Note (Signed)
Case Management Note  Patient Details  Name: Edward Duran MRN: [id number] Date of Birth: 06-30-1955  Subjective/Objective:                    Action/Plan:   Expected Discharge Date:                  Expected Discharge Plan:  [place]  In-House Referral:  NA  Discharge planning Services  CM Consult  Post Acute Care Choice:  Home Health Choice offered to:  Patient  DME Arranged:    DME Agency:     HH Arranged:  RN, PT [place] Agency:     Status of Service:  In process, will continue to follow  Medicare Important Message Given:    Date Medicare IM Given:    Medicare IM give by:    Date Additional Medicare IM Given:    Additional Medicare Important Message give by:     If discussed at [place] of Stay Meetings, dates discussed:    Additional Comments: CM spoke with pt in regards to home health RN and PT at discharge. Pt would like to speak to his son about it before making a decision. Home health list left with pt and once decision is made, bedside RN can arrange with agency of choice. No DME needs noted. Christinia Gully [place], RN 01/08/2015, 2:32 PM

## 2015-01-08 NOTE — Progress Notes (Signed)
[facility] PROGRESS NOTE  Edward Duran:[id number] DOB: 1955/04/24    PCP:   No PCP Per Patient   HPI:  39 yom with history of metastatic lung cancer stage IV presented with complaints of dehydration, weakness, and anorexia. Respiratory status improved in ED with one dose of steroids, continue neb treatments PRN. His overall condition has declined and he is failing to thrive. Oncology has been consulted, to address further cancer treatment and see if he is a candidate for hospice at this point. MRI brain revealed acute nonhemorrhagic infarcts in the posterior right cerebral hemisphere, neurology following.  He was placed on ASA by oncology.  He has no other complaints. He is a candidate for systemic palliative chemotherapy.   Rewiew of Systems:  Constitutional: Negative for malaise, fever and chills. No significant weight loss or weight gain Eyes: Negative for eye pain, redness and discharge, diplopia, visual changes, or flashes of light. ENMT: Negative for ear pain, hoarseness, nasal congestion, sinus pressure and sore throat. No headaches; tinnitus, drooling, or problem swallowing. Cardiovascular: Negative for chest pain, palpitations, diaphoresis, dyspnea and peripheral edema. ; No orthopnea, PND Respiratory: Negative for cough, hemoptysis, wheezing and stridor. No pleuritic chestpain. Gastrointestinal: Negative for nausea, vomiting, diarrhea, constipation, abdominal pain, melena, blood in stool, hematemesis, jaundice and rectal bleeding.    Genitourinary: Negative for frequency, dysuria, incontinence,flank pain and hematuria; Musculoskeletal: Negative for back pain and neck pain. Negative for swelling and trauma.;  Skin: . Negative for pruritus, rash, abrasions, bruising and skin lesion.; ulcerations Neuro: Negative for headache, lightheadedness and neck stiffness. Negative for weakness, altered level of consciousness , altered mental status, extremity weakness, burning feet,  involuntary movement, seizure and syncope.  Psych: negative for anxiety, depression, insomnia, tearfulness, panic attacks, hallucinations, paranoia, suicidal or homicidal ideation   Past Medical History  Diagnosis Date  . COPD (chronic obstructive pulmonary disease) ([place])   . Hypertension   . Stroke ([place])   . Cancer ([place])   . Carotid artery disease ([place])   . DNR (do not resuscitate) 12/12/2014  . Squamous cell carcinoma of lung, stage IV ([place]) 12/02/2014    Squamous Cell Histology   . Stomach cancer ([place])   . Stomach cancer ([place])     History reviewed. No pertinent past surgical history.  Medications:  HOME MEDS: Prior to Admission medications   Medication Sig Start Date End Date Taking? Authorizing Provider  ALPRAZolam (XANAX) 0.25 MG tablet Take 1 tablet (0.25 mg total) by mouth 3 (three) times daily as needed for anxiety. 12/26/14  Yes Meredith Staggers, MD  budesonide-formoterol [facility]) 160-4.5 MCG/ACT inhaler Inhale 2 puffs into the lungs 2 (two) times daily. 11/06/14  Yes Daniel J Angiulli, PA-C  CARBOPLATIN IV Inject into the vein. Days 1 & 8 every 21 days. Starting 12/05/14   Yes Historical Provider, MD  Gemcitabine HCl (GEMZAR IV) Inject into the vein. Days 1 & 8 every 21 days. Starting 12/05/14   Yes Historical Provider, MD  ipratropium-albuterol (DUONEB) 0.5-2.5 (3) MG/3ML SOLN Take 3 mLs by nebulization every 6 (six) hours. Patient taking differently: Take 3 mLs by nebulization every 6 (six) hours as needed (shortness of breath/wheezing).  11/21/14  Yes Patrici Ranks, MD  methocarbamol (ROBAXIN) 500 MG tablet Take 1 tablet (500 mg total) by mouth every 6 (six) hours as needed for muscle spasms. 11/06/14  Yes Daniel J Angiulli, PA-C  morphine (MS CONTIN) 15 MG 12 hr tablet Take 1 tablet (15 mg total) by mouth every 12 (  twelve) hours. 12/23/14  Yes Manon Hilding Kefalas, PA-C  olmesartan-hydrochlorothiazide (BENICAR HCT) 20-12.5 MG tablet Take 1 tablet by mouth daily.    Yes  Historical Provider, MD  ondansetron (ZOFRAN) 8 MG tablet Take 1 tablet (8 mg total) by mouth every 8 (eight) hours as needed for nausea or vomiting. 12/03/14  Yes Patrici Ranks, MD  Oxycodone HCl 10 MG TABS Take 1 tablet (10 mg total) by mouth every 4 (four) hours as needed. Patient taking differently: Take 10 mg by mouth every 4 (four) hours as needed. pain 12/23/14  Yes Manon Hilding Kefalas, PA-C  polyethylene glycol (MIRALAX / GLYCOLAX) packet Take 17 g by mouth 2 (two) times daily. Patient taking differently: Take 17 g by mouth daily as needed for mild constipation.  10/28/14  Yes Shanker Kristeen Mans, MD  potassium chloride SA (K-DUR,KLOR-CON) 20 MEQ tablet Take 1 tablet (20 mEq total) by mouth 2 (two) times daily. 12/12/14  Yes Baird Cancer, PA-C  folic acid (FOLVITE) 1 MG tablet Take 1 tablet (1 mg total) by mouth daily. Patient not taking: Reported on 01/05/2015 11/06/14   Lavon Paganini Angiulli, PA-C  Multiple Vitamin (MULTIVITAMIN WITH MINERALS) TABS tablet Take 1 tablet by mouth daily. Patient not taking: Reported on 12/26/2014 11/06/14   Lavon Paganini Angiulli, PA-C  senna-docusate (SENOKOT-S) 8.6-50 MG per tablet Take 2 tablets by mouth at bedtime. Patient not taking: Reported on 01/05/2015 10/28/14   Jonetta Osgood, MD  tamsulosin (FLOMAX) 0.4 MG CAPS capsule Take 1 capsule (0.4 mg total) by mouth daily. Patient not taking: Reported on 01/05/2015 11/06/14   Lavon Paganini Angiulli, PA-C     Allergies:  No Known Allergies  Social History:   reports that he has been smoking.  He has never used smokeless tobacco. He reports that he does not drink alcohol or use illicit drugs.  Family History: Family History  Problem Relation Age of Onset  . Cancer Mother   . Cancer Father      Physical Exam: Filed Vitals:   01/08/15 0500 01/08/15 0812 01/08/15 0900 01/08/15 1300  BP: 133/68  129/64 140/62  Pulse: 82  68 75  Temp: 98.5 F (36.9 C)  97.5 F (36.4 C) 97.4 F (36.3 C)  TempSrc: Oral   Oral Oral  Resp: '18  18 18  '$ Height:      Weight:      SpO2: 100% 99% 100% 100%   Blood pressure 140/62, pulse 75, temperature 97.4 F (36.3 C), temperature source Oral, resp. rate 18, height '5\' 6"'$  (1.676 m), weight 46.267 kg (102 lb), SpO2 100 %.  GEN:  Pleasant  patient lying in the stretcher in no acute distress; cooperative with exam. PSYCH:  alert and oriented x4; does not appear anxious or depressed; affect is appropriate. HEENT: Mucous membranes pink and anicteric; PERRLA; EOM intact; no cervical lymphadenopathy nor thyromegaly or carotid bruit; no JVD; There were no stridor. Neck is very supple. Breasts:: Not examined CHEST WALL: No tenderness CHEST: Normal respiration, clear to auscultation bilaterally.  HEART: Regular rate and rhythm.  There are no murmur, rub, or gallops.   BACK: No kyphosis or scoliosis; no CVA tenderness ABDOMEN: soft and non-tender; no masses, no organomegaly, normal abdominal bowel sounds; no pannus; no intertriginous candida. There is no rebound and no distention. Rectal Exam: Not done EXTREMITIES: No bone or joint deformity; age-appropriate arthropathy of the hands and knees; no edema; no ulcerations.  There is no calf tenderness. Genitalia: not examined PULSES: 2+  and symmetric SKIN: Normal hydration no rash or ulceration CNS: Cranial nerves 2-12 grossly intact no focal lateralizing neurologic deficit.  Speech is fluent; uvula elevated with phonation, facial symmetry and tongue midline. DTR are normal bilaterally, cerebella exam is intact, barbinski is negative and strengths are equaled bilaterally.  No sensory loss.   Labs on Admission:  Basic Metabolic Panel:  Recent Labs Lab 01/02/15 0934 01/05/15 1225 01/06/15 0716 01/07/15 0624 01/08/15 0636  NA 134* 135 135 136 137  K 3.7 3.6 4.5 4.3 4.8  CL 104 99* 106 107 109  CO2 '24 23 22 '$ 21* 24  GLUCOSE 160* 101* 113* 107* 104*  BUN '14 18 19 16 13  '$ CREATININE 0.93 1.00 0.89 0.85 0.82  CALCIUM 8.0*  8.4* 7.4* 7.7* 7.8*   Liver Function Tests:  Recent Labs Lab 01/02/15 0934 01/05/15 1225 01/06/15 0716  AST 23 47* 34  ALT '20 28 24  '$ ALKPHOS 120 130* 94  BILITOT 0.5 0.7 0.5  PROT 6.8 7.3 5.6*  ALBUMIN 3.4* 3.7 2.9*   CBC:  Recent Labs Lab 01/02/15 0934 01/05/15 1225 01/06/15 0716 01/07/15 0624 01/08/15 0636  WBC 4.7 7.9 6.6 4.6 2.6*  NEUTROABS 3.2 7.0  --   --   --   HGB 11.4* 11.0* 9.3* 9.2* 9.3*  HCT 33.5* 32.4* 27.2* 27.1* 27.7*  MCV 89.1 89.5 89.5 89.1 89.9  PLT 220 112* 89* 69* 45*   Radiological Exams on Admission: Mr [name] Head Wo Contrast  01/06/2015  CLINICAL DATA:  Left-sided weakness and confusion.  Acute infarct. EXAM: MRA HEAD WITHOUT CONTRAST TECHNIQUE: Angiographic images of the Circle of Willis were obtained using MRA technique without intravenous contrast. COMPARISON:  None. FINDINGS: Dominant right vertebral artery with no visible flow in the distal left vertebral artery, chronic based on 10/21/2014 head CT with contrast. There is a dominant left AICA with nonvisualized left PICA. Well opacified right PICA. Diffuse atherosclerotic irregularity at the basilar artery which is tortuous, without flow limiting stenosis. Patent superior cerebellar arteries. Focal high-grade right P2-P3 segment stenosis. High-grade left P4 branch stenosis. Chronically occluded left carotid artery with collapsed and calcified lumen on CT 10/21/2014. There is underfilling of a left-sided ACA and MCA branches. Although there is a left posterior communicating artery, is not appeared to significantly ram outlined left MCA flow. There is extensive atherosclerotic disease of the right carotid with tandem high-grade stenoses of the right cavernous carotid with flow gap at the more distal stenosis. Diffuse atherosclerotic irregularity of right MCA branches. No evidence of aneurysm. IMPRESSION: 1. Most pertinent to acute watershed territory infarcts on the right, there are high-grade stenoses of  the right cavernous carotid and right P2-P3 segment. 2. Chronic left ICA occlusion and underfilled left ACA and MCA vessels. 3. 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 Electronically Signed   By: Monte Fantasia M.D.   On: 01/06/2015 15:27   Mr Jeri Cos VZ Contrast  01/06/2015  CLINICAL DATA:  Left-sided weakness with confusion. History of colon cancer. EXAM: MRI HEAD WITHOUT AND WITH CONTRAST TECHNIQUE: Multiplanar, multiecho pulse sequences of the brain and surrounding structures were obtained without and with intravenous contrast. CONTRAST:  41m MULTIHANCE GADOBENATE DIMEGLUMINE 529 MG/ML IV SOLN COMPARISON:  Head CT with and without contrast 10/21/2014 FINDINGS: Calvarium and upper cervical spine: Soft tissue loss and scarring in the left temporal fossa. No underlying marrow signal abnormality. Orbits: No significant findings. Sinuses and Mastoids: Clear. Brain: There is multi focal cortical and  subcortical restricted diffusion in the posterior right cerebral hemisphere, roughly aligned along the posterior border zone/watershed territory. No hemorrhagic conversion or shift. There is a remote infarct of the left posterior cerebral hemisphere with similar pattern. Chronic small-vessel disease, asymmetric to the left cerebral hemisphere, correlating with chronic occlusion of the left ICA. No acute major vessel occlusion is identified. Motion graded postcontrast imaging, nondiagnostic in this patient with history of malignancy. Patient is currently admitted. IMPRESSION: 1. Acute nonhemorrhagic infarcts in the posterior right cerebral hemisphere, posterior watershed distribution. 2. 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 3. Nondiagnostic postcontrast imaging due to motion. Electronically Signed   By: Monte Fantasia M.D.   On: 01/06/2015 15:10   Mr Cervical Spine W Wo Contrast  01/06/2015  CLINICAL DATA:  Left-sided  weakness with confusion. Colon cancer history. EXAM: MRI CERVICAL SPINE WITHOUT AND WITH CONTRAST TECHNIQUE: Multiplanar and multiecho pulse sequences of the cervical spine, to include the craniocervical junction and cervicothoracic junction, were obtained according to standard protocol without and with intravenous contrast. CONTRAST:  41m MULTIHANCE GADOBENATE DIMEGLUMINE 529 MG/ML IV SOLN COMPARISON:  None. FINDINGS: Severely motion degraded study - all axial sequences are nondiagnostic. Heavily relying on sagittal T1 pre/postcontrast and sagittal STIR imaging there is no evidence of metastatic disease, discitis, or cord lesion. Disc degeneration with notable central protrusion at C3-4 that combines with dorsal ligamentous buckling to cause mild ventral cord mass effect. There is ligamentum flavum thickening and buckling at C5-6 with moderate canal stenosis and circumferential CSF effacement. IMPRESSION: 1. Motion degraded study, incomplete due to nondiagnostic axial imaging. 2. No evidence of metastatic disease or cord lesion. 3. 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 Electronically Signed   By: JMonte FantasiaM.D.   On: 01/06/2015 15:18   [place]otid Bilateral  01/07/2015  CLINICAL DATA:  Stroke, hypertension, syncope, previous tobacco abuse EXAM: BILATERAL CAROTID DUPLEX ULTRASOUND TECHNIQUE: GPearline Cablesscale imaging, color Doppler and duplex ultrasound was performed of bilateral carotid and vertebral arteries in the neck. COMPARISON:  CT 11/27/2014, ultrasound 12/ 12 2002 by report only REVIEW OF SYSTEMS: Quantification of carotid stenosis is based on velocity parameters that correlate the residual internal carotid diameter with NASCET-based stenosis levels, using the diameter of the distal internal carotid lumen as the denominator for stenosis measurement. The following velocity measurements were obtained: PEAK SYSTOLIC/END DIASTOLIC RIGHT ICA:                     85/24cm/sec CCA:                      1585/27PO/EUMSYSTOLIC ICA/CCA RATIO:  0.5 DIASTOLIC ICA/CCA RATIO: 0.9 ECA:                     175cm/sec LEFT ICA:                     Occluded CCA:                     Occluded SYSTOLIC ICA/CCA RATIO:  N/a DIASTOLIC ICA/CCA RATIO: N/a ECA:                     Occluded FINDINGS: RIGHT CAROTID ARTERY: Circumferential plaque through the common carotid artery, with focal areas of calcification, no high-grade stenosis. There is circumferential eccentric plaque effacing the carotid bulb and extending into the proximal ICA resulting in at  least mild stenosis. Normal waveforms and color Doppler signal. RIGHT VERTEBRAL ARTERY:  Normal flow direction and waveform. LEFT CAROTID ARTERY: Long segment occlusion of the common carotid, bulb, and ICA. This was partially visible on prior chest CT of 11/26/2014. There is collateral reconstitution of the external carotid artery with low resistance waveform. LEFT VERTEBRAL ARTERY: Normal flow direction and waveform. IMPRESSION: 1. 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 2. Diffuse plaque in the right carotid system resulting in less than 50% diameter stenosis. The exam does not exclude plaque ulceration or embolization. Continued surveillance recommended. 3. Bilateral antegrade vertebral arterial flow. Electronically Signed   By: Lucrezia Europe M.D.   On: 01/07/2015 15:36    Assessment/Plan Present on Admission:  . Metastatic lung cancer (metastasis from lung to other site) [facility]) . COPD (chronic obstructive pulmonary disease) ([place]) . HTN (hypertension) . Thrombocytopenia ([place]) . Anemia, chronic disease . Acute CVA (cerebrovascular accident) ([place]) . Protein-calorie malnutrition, severe    1. Failure to thrive since starting chemotherapy. Family report he has lost appx 40 pounds in the last two months and he has "good days" and "bad days." Reports being able to eat without difficulty today. Nutrition is following.  2. Dehydration,  secondary to FTT. Improving, will continue IVF. 3. Anemia of chronic disease, likely related to underlying malignancy. Stable. No evidence of bleeding.  4. COPD. Pt reportedly SOB on admission. He received neb treatments and one dose of steroids. Respiratory status appears to have returned to baseline. Family reports he may not be using his bronchodilators as prescribed. Breathing treatments PRN. 5. HTN. Continue home medications.  6. Chronic pain. Continue pain medication regimen.  7. Thrombocytopenia. Likely related to recent chemo. Continue to monitor.  8. Metastatic lung cancer stage IV on palliative chemotherapy. Last chemo 11/17. Followed by Dr. Whitney Muse, oncologist. He has been referred to radiation oncology for symptom management in his lumbar spine. His overall conditions continues to decline.  Oncology felt he is still a candidate for palliative systemic chemotherapy.  For his acute CVA. MRI brain revealed acute nonhemorrhagic infarcts in the posterior right cerebral hemisphere, posterior watershed distribution. Appreciate neurology recommendations. I am reluctant to start hm on antithrombotic therapy, due to his worsening platelet count and high risk of bleeding. Will start patient on statin for elevated LDL. Echo and carotid doppler are in process.  9. Severe malnutrition in the context of chronic illness. Nutrition following.  Code Status: DNR DVT prophylaxis: SCDs Family Communication: Discussed with patient who understands and has no concerns at this time.  Disposition Plan: Anticipate discharge within 1-2 days   Other plans as per orders.  Code Status: DNR.    Orvan Falconer, MD. [facility] Pager [phone] 7pm to 7am.  01/08/2015, 2:23 PM

## 2015-01-08 NOTE — Progress Notes (Signed)
Physical Therapy Treatment Patient Details Name: Edward Duran MRN: [id number] DOB: [birth year] Today's Date: 01/08/2015    History of Present Illness  This is a 59 year old man who has stage IV squamous cell carcinoma of the lung and received chemotherapy 3 days ago. In the last 48 hours, he has gone downhill with anorexia, weakness, mostly lying in bed. He also apparently has been somewhat more confused. He takes pain medicines for back pain. He also has a cough which is long-standing but does not appear to be productive. He denies any fever. He is not much more short of breath than usual. He has COPD. He is continue to smoke a pack of cigarettes a day. He is now being admitted to the hospital for further management.    PT Comments    Pt reported fatigue but otherwise no pain or other distress.  He is found to have had a stroke with left hemiparesis which explains the significant weakness seen in the LUE and mild weakness in the LLE.  Today he is found to have some return of strength in the left extremities.  His distal strength is much better than proximal.  He has a good grip but poor coordination.  Elbow strength 3-/5, shoulder 2/5.  His LLE is now equal to the RLE.  His functional mobility has improved and after log rolling instruction he is now able to transfer to EOB without assist.  He was able to stand to a walker and ambulate 30' using a walker with supervision. He needed instruction to stay closer to the walker. He was very exhausted from transfer training and gait training.  He should be able to transition to home at d/c with HHPT if possible.  He has all needed DME.  I have advised him to not ambulate without standby assist (someone other than his mother) so he will be needing to increase the use of his w/c.  Follow Up Recommendations  Home health PT     Equipment Recommendations  None recommended by PT    Recommendations for Other Services  OT     Precautions / Restrictions  Precautions Precautions: Fall Restrictions Weight Bearing Restrictions: No    Mobility  Bed Mobility Overal bed mobility: Modified Independent             General bed mobility comments: transfer is slow but without assist  Transfers Overall transfer level: Needs assistance Equipment used: Rolling walker (2 wheeled) Transfers: Sit to/from Stand Sit to Stand: Supervision         General transfer comment: transfer is much less labored today  Ambulation/Gait Ambulation/Gait assistance: Supervision Ambulation Distance (Feet): 30 Feet Assistive device: Rolling walker (2 wheeled) Gait Pattern/deviations: WFL(Within Functional Limits)   Gait velocity interpretation: <1.8 ft/sec, indicative of risk for recurrent falls     Stairs            Wheelchair Mobility    Modified Rankin (Stroke Patients Only)       Balance   Sitting-balance support: No upper extremity supported;Feet supported Sitting balance-Leahy Scale: Good     Standing balance support: Bilateral upper extremity supported Standing balance-Leahy Scale: Fair                      Cognition Arousal/Alertness: Awake/alert Behavior During Therapy: WFL for tasks assessed/performed Overall Cognitive Status: Within Functional Limits for tasks assessed  Exercises      General Comments        Pertinent Vitals/Pain Pain Assessment: No/denies pain    Home Living                      Prior Function            PT Goals (current goals can now be found in the care plan section) Progress towards PT goals: Progressing toward goals    Frequency  Min 3X/week    PT Plan Discharge plan needs to be updated    Co-evaluation             End of Session Equipment Utilized During Treatment: Gait belt Activity Tolerance: Patient limited by fatigue Patient left: in bed;with call bell/phone within reach     Time: 1311-1342 PT Time Calculation  (min) (ACUTE ONLY): 31 min  Charges:  $Gait Training: 8-22 mins $Therapeutic Activity: 8-22 mins                    G CodesSable Feil   PT  01/08/2015, 1:44 PM [phone]

## 2015-01-08 NOTE — Progress Notes (Signed)
Patient ID: FUMIO VANDAM, male   DOB: Oct 08, 1955, 59 y.o.   MRN: [id number]  Cash A. Merlene Laughter, MD     [email or web address]          BINNIE VONDERHAAR is an 59 y.o. male.   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  Significant thrombocytopenia.   Vascular dementia.  Stage V metastatic lung disease.  RECOMMENDATION: Dual antiplatelet agent for 3 months. Subsequently, the patient should be on a single antiplatelet agent. ????? Being held due thrombocytopenia.  Dementia labs. Agree with statin.    He has no complaints today. Seems more coherent.   GENERAL: No acute distress  HEENT: Supple. Atraumatic normocephalic.   ABDOMEN: soft  EXTREMITIES: No edema   BACK: Normal.  SKIN: Normal by inspection.   MENTAL STATUS: The patient is awake and alert. He is oriented person and [employer]. However, still time Orientation issues with the patient believing that it is February 2015. More lucid and coherent today. Follows commands briskly.  CRANIAL NERVES: Pupils are equal, round and reactive to light and accommodation; extra ocular movements are full, there is no significant nystagmus; visual fields are full; upper and lower facial muscles are normal in strength and symmetric, there is no flattening of the nasolabial folds; tongue is midline; uvula is midline; shoulder elevation is normal.  MOTOR: Normal tone, bulk and strength- R; LUE pronator drift. No drift of the legs. LUE 3/5; LLU 5/5  COORDINATION: Left finger to nose is normal, right finger to nose is normal, No rest tremor; no intention tremor; no postural tremor; no bradykinesia.  REFLEXES: Deep tendon reflexes are symmetrical and normal, but brisk in the legs. Babinski reflexes are extensor bilaterally.   SENSATION: Normal to light touch. The patient extinguishes to  double some nystagmus stimulation on the left side and to visual stimulation.         Objective: Vital signs in last 24 hours: Temp:  [97.4 F (36.3 C)-98.6 F (37 C)] 97.4 F (36.3 C) (11/23 1300) Pulse Rate:  [68-82] 75 (11/23 1300) Resp:  [18] 18 (11/23 1300) BP: (128-140)/(60-74) 140/62 mmHg (11/23 1300) SpO2:  [99 %-100 %] 100 % (11/23 1300)  Intake/Output from previous day: 11/22 0701 - 11/23 0700 In: 4358.8 [P.O.:840; I.V.:3518.8] Out: 1000 [Urine:1000] Intake/Output this shift: Total I/O In: 240 [P.O.:240] Out: 500 [Urine:500] Nutritional status: Diet regular Room service appropriate?: Yes; Fluid consistency:: Thin   Lab Results: Results for orders placed or performed during the hospital encounter of 01/05/15 (from the past 48 hour(s))  CBC     Status: Abnormal   Collection Time: 01/07/15  6:24 AM  Result Value Ref Range   WBC 4.6 4.0 - 10.5 K/uL   RBC 3.04 (L) 4.22 - 5.81 MIL/uL   Hemoglobin 9.2 (L) 13.0 - 17.0 g/dL   HCT 27.1 (L) 39.0 - 52.0 %   MCV 89.1 78.0 - 100.0 fL   MCH 30.3 26.0 - 34.0 pg   MCHC 33.9 30.0 - 36.0 g/dL   RDW 15.4 11.5 - 15.5 %   Platelets 69 (L) 150 - 400 K/uL    Comment: SPECIMEN CHECKED FOR CLOTS CONSISTENT WITH PREVIOUS RESULT   Basic metabolic panel     Status: Abnormal   Collection Time: 01/07/15  6:24 AM  Result Value Ref Range   Sodium 136 135 - 145 mmol/L   Potassium 4.3 3.5 -  5.1 mmol/L   Chloride 107 101 - 111 mmol/L   CO2 21 (L) 22 - 32 mmol/L   Glucose, Bld 107 (H) 65 - 99 mg/dL   BUN 16 6 - 20 mg/dL   Creatinine, Ser 0.85 0.61 - 1.24 mg/dL   Calcium 7.7 (L) 8.9 - 10.3 mg/dL   GFR calc non Af Amer >60 >60 mL/min   GFR calc Af Amer >60 >60 mL/min    Comment: (NOTE) The eGFR has been calculated using the CKD EPI equation. This calculation has not been validated in all clinical situations. eGFR's persistently <60 mL/min signify possible Chronic Kidney Disease.    Anion gap 8 5 - 15  CBC     Status: Abnormal    Collection Time: 01/08/15  6:36 AM  Result Value Ref Range   WBC 2.6 (L) 4.0 - 10.5 K/uL   RBC 3.08 (L) 4.22 - 5.81 MIL/uL   Hemoglobin 9.3 (L) 13.0 - 17.0 g/dL   HCT 27.7 (L) 39.0 - 52.0 %   MCV 89.9 78.0 - 100.0 fL   MCH 30.2 26.0 - 34.0 pg   MCHC 33.6 30.0 - 36.0 g/dL   RDW 15.4 11.5 - 15.5 %   Platelets 45 (L) 150 - 400 K/uL    Comment: SPECIMEN CHECKED FOR CLOTS PLATELET COUNT CONFIRMED BY SMEAR   Basic metabolic panel     Status: Abnormal   Collection Time: 01/08/15  6:36 AM  Result Value Ref Range   Sodium 137 135 - 145 mmol/L   Potassium 4.8 3.5 - 5.1 mmol/L   Chloride 109 101 - 111 mmol/L   CO2 24 22 - 32 mmol/L   Glucose, Bld 104 (H) 65 - 99 mg/dL   BUN 13 6 - 20 mg/dL   Creatinine, Ser 0.82 0.61 - 1.24 mg/dL   Calcium 7.8 (L) 8.9 - 10.3 mg/dL   GFR calc non Af Amer >60 >60 mL/min   GFR calc Af Amer >60 >60 mL/min    Comment: (NOTE) The eGFR has been calculated using the CKD EPI equation. This calculation has not been validated in all clinical situations. eGFR's persistently <60 mL/min signify possible Chronic Kidney Disease.    Anion gap 4 (L) 5 - 15  Vitamin B12     Status: None   Collection Time: 01/08/15  6:36 AM  Result Value Ref Range   Vitamin B-12 443 180 - 914 pg/mL    Comment: (NOTE) This assay is not validated for testing neonatal or myeloproliferative syndrome specimens for Vitamin B12 levels. Performed at [place]  01/06/15 0716  CHOL 178  TRIG 88  HDL 53  CHOLHDL 3.4  VLDL 18  LDLCALC 107*    Studies/Results:  CAROTID DOPPLER IMPRESSION: 1. 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 2. Diffuse plaque in the right carotid system resulting in less than 50% diameter stenosis. The exam does not exclude plaque ulceration or embolization. Continued surveillance recommended. 3. Bilateral antegrade vertebral arterial flow.    ECHO  Left ventricle: The cavity  size was normal. Wall thickness was normal. Systolic function was normal. The estimated ejection fraction was in the range of 55% to 60%. The study is not technically sufficient to allow evaluation of LV diastolic function. - Aortic valve: Poorly visualized. Moderately calcified annulus. Trileaflet; moderately thickened leaflets. Grossly there appears to be no significant stenosis or regurgitation. - Mitral valve: Poorly visualized. Mildly calcified annulus.  Mildly thickened leaflets . There was mild regurgitation. - Left atrium: The atrium was mildly dilated. - Right ventricle: Poorly visualized. Grossly appears normal in size and fucntion. - Pulmonary arteries: PA peak pressure: 30 mm Hg (S). PASP is borderline elevated. - Technically difficult study.  Medications:  Scheduled Meds: . aspirin EC  81 mg Oral Daily  . atorvastatin  10 mg Oral q1800  . budesonide-formoterol  2 puff Inhalation BID  . feeding supplement (ENSURE ENLIVE)  237 mL Oral BID BM  . irbesartan  150 mg Oral Daily   And  . hydrochlorothiazide  12.5 mg Oral Daily  . morphine  15 mg Oral Q12H  . potassium chloride SA  20 mEq Oral BID   Continuous Infusions: . sodium chloride 75 mL/hr at 01/07/15 1804   PRN Meds:.ALPRAZolam, alum & mag hydroxide-simeth, ipratropium-albuterol, methocarbamol, morphine injection, ondansetron **OR** ondansetron (ZOFRAN) IV, polyethylene glycol     LOS: 2 days   Zamirah Denny A. Merlene Laughter, M.D.  Diplomate, Tax adviser of Psychiatry and Neurology ( Neurology).

## 2015-01-09 LAB — RPR: RPR: NONREACTIVE

## 2015-01-09 MED ORDER — ASPIRIN 81 MG PO TBEC: 81.0000 mg | DELAYED_RELEASE_TABLET | Freq: Every day | ORAL | Status: AC

## 2015-01-09 NOTE — Discharge Summary (Signed)
Physician Discharge Summary  Edward Duran ZSW:[id number] DOB: 01/12/1956 DOA: 01/05/2015  PCP: No PCP Per Patient  Admit date: 01/05/2015 Discharge date: 01/09/2015  Time spent: 35 minutes  Recommendations for Outpatient Follow-up:  1. Follow up with PCP next week. 2. Follow up with Dr Whitney Muse next week.    Discharge Diagnoses:  Active Problems:   HTN (hypertension)   COPD (chronic obstructive pulmonary disease) (HCC)   Metastatic lung cancer (metastasis from lung to other site) (HCC)   Dehydration   Thrombocytopenia (HCC)   Anemia, chronic disease   Acute CVA (cerebrovascular accident) ([place])   Protein-calorie malnutrition, severe   Discharge Condition: Stable.   Diet recommendation: as tolerated.   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  As per his H and P:  " Edward Duran is a 59 y.o. male  This is a 59 year old man who has stage IV squamous cell carcinoma of the lung and received chemotherapy 3 days ago. In the last 48 hours, he has gone downhill with anorexia, weakness, mostly lying in bed. He also apparently has been somewhat more confused. He takes pain medicines for back pain. He also has a cough which is long-standing but does not appear to be productive. He denies any fever. He is not much more short of breath than usual. He has COPD. He is continue to smoke a pack of cigarettes a day. He is now being admitted to the hospital for further management.  Hospital Course:  31 yom with history of metastatic lung cancer stage IV presented with complaints of dehydration, weakness, and anorexia. Respiratory status improved in ED with one dose of steroids, and he was give continued neb treatments PRN. His overall condition has declined and he is failing to thrive. Oncology has been  consulted, to address further cancer treatment and to see if he is a candidate for hospice at this point. Oncology felt that he is still a candidate for systemic palliative chemotherapy. His  MRI brain revealed acute nonhemorrhagic infarcts in the posterior right cerebral hemisphere, and neurology recommmended an ASA to be given to him.  His platelet count was low, so he originally was not given ASA.   He was placed on ASA by oncology.He was not given statin as his diet has been so poor, that statin may not be appropriate for him.  He has no other complaints. He is a candidate for systemic palliative chemotherapy.  He is anxious to go home, and is stable for discharge.  He will see his PCP in one week, and will have him see Dr Whitney Muse as well.  Thank you and Good Day.    Consultations:  Neurology:  Dr Merlene Laughter  Oncology:  Dr Whitney Muse.   Discharge Exam: Filed Vitals:   01/08/15 2203 01/09/15 0646  BP: 162/72 151/73  Pulse: 89 76  Temp: 97.5 F (36.4 C) 97.9 F (36.6 C)  Resp: 20 20     Discharge Instructions   Discharge Instructions    Diet - low sodium heart healthy    Complete by:  As directed      Increase activity slowly    Complete by:  As directed           Current Discharge Medication List    START taking these medications  Details  aspirin EC 81 MG EC tablet Take 1 tablet (81 mg total) by mouth daily. Qty: 100 tablet, Refills: 1      CONTINUE these medications which have NOT CHANGED   Details  budesonide-formoterol (SYMBICORT) 160-4.5 MCG/ACT inhaler Inhale 2 puffs into the lungs 2 (two) times daily. Qty: 1 Inhaler, Refills: 12    CARBOPLATIN IV Inject into the vein. Days 1 & 8 every 21 days. Starting 12/05/14   Associated Diagnoses: Metastatic lung cancer (metastasis from lung to other site), right (HCC)    Gemcitabine HCl (GEMZAR IV) Inject into the vein. Days 1 & 8 every 21 days. Starting 12/05/14   Associated Diagnoses: Metastatic lung cancer (metastasis  from lung to other site), right ([place])    ipratropium-albuterol (DUONEB) 0.5-2.5 (3) MG/3ML SOLN Take 3 mLs by nebulization every 6 (six) hours. Qty: 360 mL, Refills: 3   Associated Diagnoses: Metastatic cancer to spine (HCC)    methocarbamol (ROBAXIN) 500 MG tablet Take 1 tablet (500 mg total) by mouth every 6 (six) hours as needed for muscle spasms. Qty: 90 tablet, Refills: 0    morphine (MS CONTIN) 15 MG 12 hr tablet Take 1 tablet (15 mg total) by mouth every 12 (twelve) hours. Qty: 60 tablet, Refills: 0   Associated Diagnoses: Metastatic cancer to spine (HCC)    ondansetron (ZOFRAN) 8 MG tablet Take 1 tablet (8 mg total) by mouth every 8 (eight) hours as needed for nausea or vomiting. Qty: 30 tablet, Refills: 2   Associated Diagnoses: Metastatic lung cancer (metastasis from lung to other site), right (HCC)    Oxycodone HCl 10 MG TABS Take 1 tablet (10 mg total) by mouth every 4 (four) hours as needed. Qty: 90 tablet, Refills: 0   Associated Diagnoses: Metastatic cancer to spine (HCC)    polyethylene glycol (MIRALAX / GLYCOLAX) packet Take 17 g by mouth 2 (two) times daily. Qty: 14 each, Refills: 0    potassium chloride SA (K-DUR,KLOR-CON) 20 MEQ tablet Take 1 tablet (20 mEq total) by mouth 2 (two) times daily. Qty: 60 tablet, Refills: 3    ALPRAZolam (XANAX) 0.25 MG tablet TAKE 1 TABLET BY MOUTH 3 TIMES A DAY AS NEEDED FOR ANXIETY Qty: 90 tablet, Refills: 0    folic acid (FOLVITE) 1 MG tablet Take 1 tablet (1 mg total) by mouth daily. Qty: 90 tablet, Refills: 0    Multiple Vitamin (MULTIVITAMIN WITH MINERALS) TABS tablet Take 1 tablet by mouth daily.    senna-docusate (SENOKOT-S) 8.6-50 MG per tablet Take 2 tablets by mouth at bedtime.    tamsulosin (FLOMAX) 0.4 MG CAPS capsule Take 1 capsule (0.4 mg total) by mouth daily. Qty: 30 capsule, Refills: 1      STOP taking these medications     olmesartan-hydrochlorothiazide (BENICAR HCT) 20-12.5 MG tablet        No Known  Allergies    The results of significant diagnostics from this hospitalization (including imaging, microbiology, ancillary and laboratory) are listed below for reference.    Significant Diagnostic Studies: Dg Chest 1 View  01/05/2015  CLINICAL DATA:  Cough. Gastric cancer. Right lower lobe lung cancer. EXAM: CHEST 1 VIEW COMPARISON:  10/24/2014 chest radiograph. FINDINGS: Stable cardiomediastinal silhouette with port normal heart size and right hilar prominence. No pneumothorax. Stable small right pleural effusion. No left pleural effusion. Stable volume loss and patchy consolidation in the right middle and right lower lobes. IMPRESSION: 1. Stable volume loss and patchy consolidation in the right middle and right lower  lobes, in keeping with postobstructive atelectasis/pneumonia from known obstructing central right lower lung mass. 2. Stable small right pleural effusion. Electronically Signed   By: Ilona Sorrel M.D.   On: 01/05/2015 17:40   Mr [name] Head Wo Contrast  01/06/2015  CLINICAL DATA:  Left-sided weakness and confusion.  Acute infarct. EXAM: MRA HEAD WITHOUT CONTRAST TECHNIQUE: Angiographic images of the Circle of Willis were obtained using MRA technique without intravenous contrast. COMPARISON:  None. FINDINGS: Dominant right vertebral artery with no visible flow in the distal left vertebral artery, chronic based on 10/21/2014 head CT with contrast. There is a dominant left AICA with nonvisualized left PICA. Well opacified right PICA. Diffuse atherosclerotic irregularity at the basilar artery which is tortuous, without flow limiting stenosis. Patent superior cerebellar arteries. Focal high-grade right P2-P3 segment stenosis. High-grade left P4 branch stenosis. Chronically occluded left carotid artery with collapsed and calcified lumen on CT 10/21/2014. There is underfilling of a left-sided ACA and MCA branches. Although there is a left posterior communicating artery, is not appeared to  significantly ram outlined left MCA flow. There is extensive atherosclerotic disease of the right carotid with tandem high-grade stenoses of the right cavernous carotid with flow gap at the more distal stenosis. Diffuse atherosclerotic irregularity of right MCA branches. No evidence of aneurysm. IMPRESSION: 1. Most pertinent to acute watershed territory infarcts on the right, there are high-grade stenoses of the right cavernous carotid and right P2-P3 segment. 2. Chronic left ICA occlusion and underfilled left ACA and MCA vessels. 3. 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 Electronically Signed   By: Monte Fantasia M.D.   On: 01/06/2015 15:27   Mr Jeri Cos ZO Contrast  01/06/2015  CLINICAL DATA:  Left-sided weakness with confusion. History of colon cancer. EXAM: MRI HEAD WITHOUT AND WITH CONTRAST TECHNIQUE: Multiplanar, multiecho pulse sequences of the brain and surrounding structures were obtained without and with intravenous contrast. CONTRAST:  25m MULTIHANCE GADOBENATE DIMEGLUMINE 529 MG/ML IV SOLN COMPARISON:  Head CT with and without contrast 10/21/2014 FINDINGS: Calvarium and upper cervical spine: Soft tissue loss and scarring in the left temporal fossa. No underlying marrow signal abnormality. Orbits: No significant findings. Sinuses and Mastoids: Clear. Brain: There is multi focal cortical and subcortical restricted diffusion in the posterior right cerebral hemisphere, roughly aligned along the posterior border zone/watershed territory. No hemorrhagic conversion or shift. There is a remote infarct of the left posterior cerebral hemisphere with similar pattern. Chronic small-vessel disease, asymmetric to the left cerebral hemisphere, correlating with chronic occlusion of the left ICA. No acute major vessel occlusion is identified. Motion graded postcontrast imaging, nondiagnostic in this patient with history of malignancy. Patient is currently admitted.  IMPRESSION: 1. Acute nonhemorrhagic infarcts in the posterior right cerebral hemisphere, posterior watershed distribution. 2. 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 3. Nondiagnostic postcontrast imaging due to motion. Electronically Signed   By: JMonte FantasiaM.D.   On: 01/06/2015 15:10   Mr Cervical Spine W Wo Contrast  01/06/2015  CLINICAL DATA:  Left-sided weakness with confusion. Colon cancer history. EXAM: MRI CERVICAL SPINE WITHOUT AND WITH CONTRAST TECHNIQUE: Multiplanar and multiecho pulse sequences of the cervical spine, to include the craniocervical junction and cervicothoracic junction, were obtained according to standard protocol without and with intravenous contrast. CONTRAST:  941mMULTIHANCE GADOBENATE DIMEGLUMINE 529 MG/ML IV SOLN COMPARISON:  None. FINDINGS: Severely motion degraded study - all axial sequences are nondiagnostic. Heavily relying on sagittal T1  pre/postcontrast and sagittal STIR imaging there is no evidence of metastatic disease, discitis, or cord lesion. Disc degeneration with notable central protrusion at C3-4 that combines with dorsal ligamentous buckling to cause mild ventral cord mass effect. There is ligamentum flavum thickening and buckling at C5-6 with moderate canal stenosis and circumferential CSF effacement. IMPRESSION: 1. Motion degraded study, incomplete due to nondiagnostic axial imaging. 2. No evidence of metastatic disease or cord lesion. 3. 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 Electronically Signed   By: Monte Fantasia M.D.   On: 01/06/2015 15:18   US Carotid Bilateral  01/07/2015  CLINICAL DATA:  Stroke, hypertension, syncope, previous tobacco abuse EXAM: BILATERAL CAROTID DUPLEX ULTRASOUND TECHNIQUE: Pearline Cables scale imaging, color Doppler and duplex ultrasound was performed of bilateral carotid and vertebral arteries in the neck. COMPARISON:  CT 11/27/2014, ultrasound 12/ 12 2002 by report only  REVIEW OF SYSTEMS: Quantification of carotid stenosis is based on velocity parameters that correlate the residual internal carotid diameter with NASCET-based stenosis levels, using the diameter of the distal internal carotid lumen as the denominator for stenosis measurement. The following velocity measurements were obtained: PEAK SYSTOLIC/END DIASTOLIC RIGHT ICA:                     85/24cm/sec CCA:                     295/18AC/ZYS SYSTOLIC ICA/CCA RATIO:  0.5 DIASTOLIC ICA/CCA RATIO: 0.9 ECA:                     175cm/sec LEFT ICA:                     Occluded CCA:                     Occluded SYSTOLIC ICA/CCA RATIO:  N/a DIASTOLIC ICA/CCA RATIO: N/a ECA:                     Occluded FINDINGS: RIGHT CAROTID ARTERY: Circumferential plaque through the common carotid artery, with focal areas of calcification, no high-grade stenosis. There is circumferential eccentric plaque effacing the carotid bulb and extending into the proximal ICA resulting in at least mild stenosis. Normal waveforms and color Doppler signal. RIGHT VERTEBRAL ARTERY:  Normal flow direction and waveform. LEFT CAROTID ARTERY: Long segment occlusion of the common carotid, bulb, and ICA. This was partially visible on prior chest CT of 11/26/2014. There is collateral reconstitution of the external carotid artery with low resistance waveform. LEFT VERTEBRAL ARTERY: Normal flow direction and waveform. IMPRESSION: 1. 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 2. Diffuse plaque in the right carotid system resulting in less than 50% diameter stenosis. The exam does not exclude plaque ulceration or embolization. Continued surveillance recommended. 3. Bilateral antegrade vertebral arterial flow. Electronically Signed   By: Lucrezia Europe M.D.   On: 01/07/2015 15:36    Recent Labs Lab 01/05/15 1225 01/06/15 0716 01/07/15 0624 01/08/15 0636  NA 135 135 136 137  K 3.6 4.5 4.3 4.8  CL 99* 106 107 109  CO2 23 22 21* 24   GLUCOSE 101* 113* 107* 104*  BUN '18 19 16 13  '$ CREATININE 1.00 0.89 0.85 0.82  CALCIUM 8.4* 7.4* 7.7* 7.8*   Liver Function Tests:  Recent Labs Lab 01/05/15 1225 01/06/15 0716  AST 47* 34  ALT 28 24  ALKPHOS 130* 94  BILITOT 0.7  0.5  PROT 7.3 5.6*  ALBUMIN 3.7 2.9*   CBC:  Recent Labs Lab 01/05/15 1225 01/06/15 0716 01/07/15 0624 01/08/15 0636  WBC 7.9 6.6 4.6 2.6*  NEUTROABS 7.0  --   --   --   HGB 11.0* 9.3* 9.2* 9.3*  HCT 32.4* 27.2* 27.1* 27.7*  MCV 89.5 89.5 89.1 89.9  PLT 112* 89* 69* 45*    Signed:  Margerie Fraiser  [facility] 01/09/2015, 2:14 PM

## 2015-01-09 NOTE — Progress Notes (Signed)
Edward Duran discharged Home per MD order.  Discharge instructions reviewed and discussed with the patient, all questions and concerns answered. Copy of instructions given to patient.    Medication List    STOP taking these medications        olmesartan-hydrochlorothiazide 20-12.5 MG tablet  Commonly known as:  BENICAR HCT      TAKE these medications        ALPRAZolam 0.25 MG tablet  Commonly known as:  XANAX  TAKE 1 TABLET BY MOUTH 3 TIMES A DAY AS NEEDED FOR ANXIETY     aspirin 81 MG EC tablet  Take 1 tablet (81 mg total) by mouth daily.     budesonide-formoterol 160-4.5 MCG/ACT inhaler  Commonly known as:  SYMBICORT  Inhale 2 puffs into the lungs 2 (two) times daily.     CARBOPLATIN IV  Inject into the vein. Days 1 & 8 every 21 days. Starting 94/50/38     folic acid 1 MG tablet  Commonly known as:  FOLVITE  Take 1 tablet (1 mg total) by mouth daily.     GEMZAR IV  Inject into the vein. Days 1 & 8 every 21 days. Starting 12/05/14     ipratropium-albuterol 0.5-2.5 (3) MG/3ML Soln  Commonly known as:  DUONEB  Take 3 mLs by nebulization every 6 (six) hours.     methocarbamol 500 MG tablet  Commonly known as:  ROBAXIN  Take 1 tablet (500 mg total) by mouth every 6 (six) hours as needed for muscle spasms.     morphine 15 MG 12 hr tablet  Commonly known as:  MS CONTIN  Take 1 tablet (15 mg total) by mouth every 12 (twelve) hours.     multivitamin with minerals Tabs tablet  Take 1 tablet by mouth daily.     ondansetron 8 MG tablet  Commonly known as:  ZOFRAN  Take 1 tablet (8 mg total) by mouth every 8 (eight) hours as needed for nausea or vomiting.     Oxycodone HCl 10 MG Tabs  Take 1 tablet (10 mg total) by mouth every 4 (four) hours as needed.     polyethylene glycol packet  Commonly known as:  MIRALAX / GLYCOLAX  Take 17 g by mouth 2 (two) times daily.     potassium chloride SA 20 MEQ tablet  Commonly known as:  K-DUR,KLOR-CON  Take 1 tablet (20 mEq  total) by mouth 2 (two) times daily.     senna-docusate 8.6-50 MG tablet  Commonly known as:  Senokot-S  Take 2 tablets by mouth at bedtime.     tamsulosin 0.4 MG Caps capsule  Commonly known as:  FLOMAX  Take 1 capsule (0.4 mg total) by mouth daily.        Patients skin is clean, dry and intact, no evidence of skin break down. IV site discontinued and catheter remains intact. Site without signs and symptoms of complications. Dressing and pressure applied.  Patient escorted to car by NT in a wheelchair,  no distress noted upon discharge.  [name] 01/09/2015 5:30 PM

## 2015-01-10 LAB — HOMOCYSTEINE: HOMOCYSTEINE-NORM: 14.5 umol/L (ref 0.0–15.0)

## 2015-01-13 ENCOUNTER — Ambulatory Visit (HOSPITAL_COMMUNITY): Payer: Self-pay

## 2015-01-16 ENCOUNTER — Inpatient Hospital Stay (HOSPITAL_COMMUNITY): Payer: Self-pay

## 2015-01-16 ENCOUNTER — Ambulatory Visit (HOSPITAL_COMMUNITY): Payer: Self-pay | Admitting: Hematology & Oncology

## 2015-01-16 ENCOUNTER — Ambulatory Visit (HOSPITAL_COMMUNITY): Payer: Self-pay

## 2015-01-23 ENCOUNTER — Inpatient Hospital Stay (HOSPITAL_COMMUNITY): Payer: Self-pay

## 2015-01-31 NOTE — Progress Notes (Signed)
This encounter was created in error - please disregard.

## 2015-02-06 ENCOUNTER — Inpatient Hospital Stay (HOSPITAL_COMMUNITY): Payer: Self-pay

## 2015-02-06 ENCOUNTER — Ambulatory Visit (HOSPITAL_COMMUNITY): Payer: Self-pay | Admitting: Oncology

## 2015-02-13 ENCOUNTER — Inpatient Hospital Stay (HOSPITAL_COMMUNITY): Payer: Self-pay

## 2015-02-16 DEATH — deceased

## 2015-02-20 ENCOUNTER — Other Ambulatory Visit (HOSPITAL_COMMUNITY): Payer: Self-pay | Admitting: Hematology & Oncology

## 2016-05-31 IMAGING — MR MR CERVICAL SPINE WO/W CM
4 of 8 series · 23 of 48 positions shown · IV contrast (9ml Multihance)
Comparison: None.

CLINICAL DATA: Left-sided weakness with confusion. Colon cancer
history.

EXAM:
MRI CERVICAL SPINE WITHOUT AND WITH CONTRAST
TECHNIQUE: Multiplanar and multiecho pulse sequences of the cervical spine, to
include the craniocervical junction and cervicothoracic junction,
were obtained according to standard protocol without and with
intravenous contrast.
CONTRAST:  9mL MULTIHANCE GADOBENATE DIMEGLUMINE 529 MG/ML IV SOLN

[Series 6: T2 · axial · 3.0mm · 0.50mm/px · z∈[-286,-187]mm · 8 of 32 slices shown (1 of 2)]
[im 1/32]
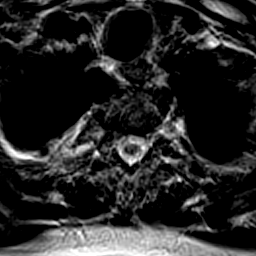
[im 5/32]
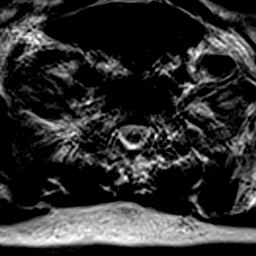
[im 9/32]
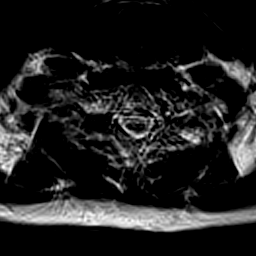
[im 14/32]
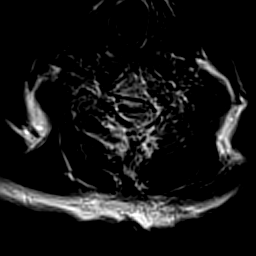
[im 18/32]
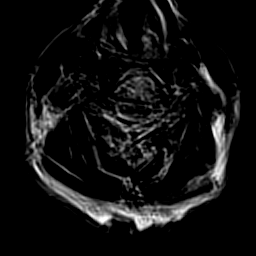
[im 23/32]
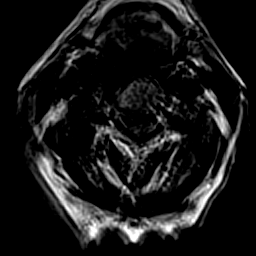
[im 27/32]
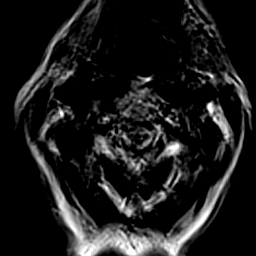
[im 32/32]
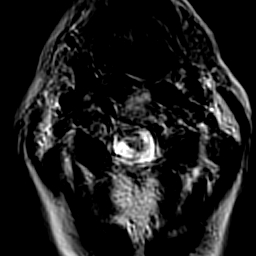

[Series 7: T1 · axial · 3.0mm · 0.25mm/px · z∈[-285,-187]mm · 8 of 32 slices shown (1 of 2)]
[im 1/32]
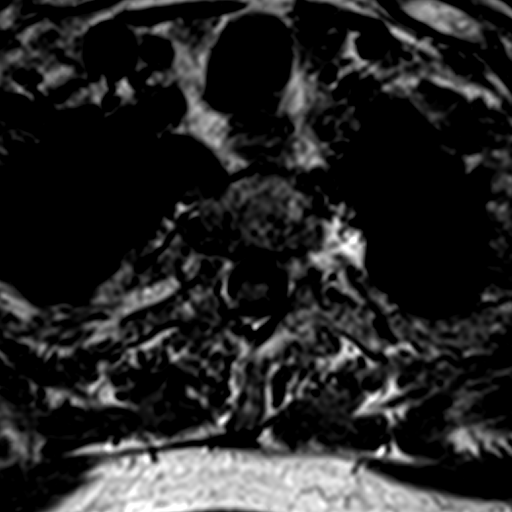
[im 5/32]
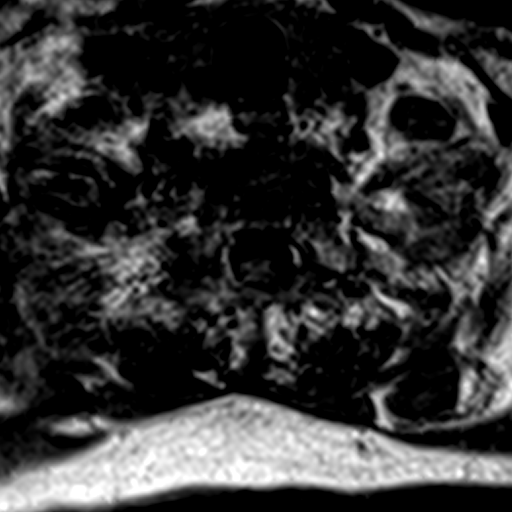
[im 9/32]
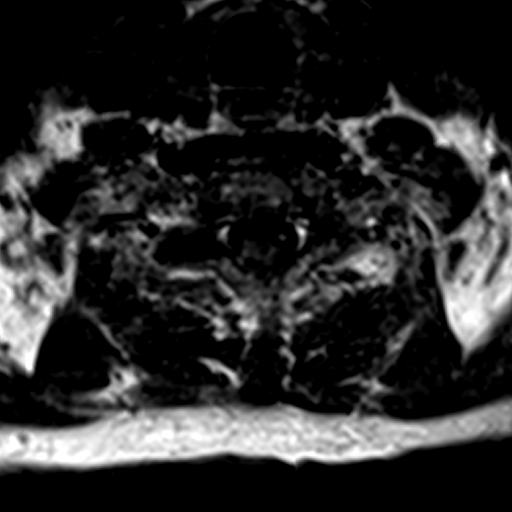
[im 14/32]
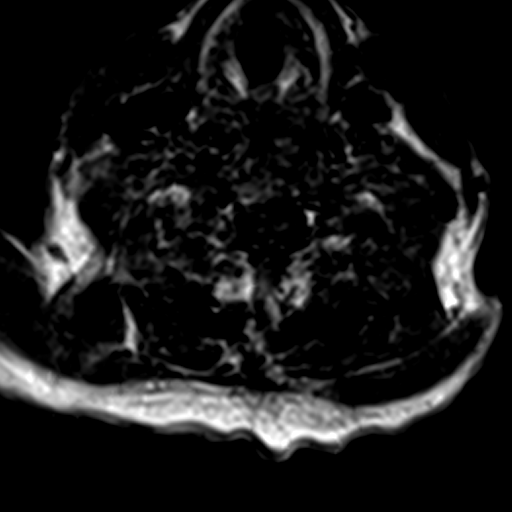
[im 18/32]
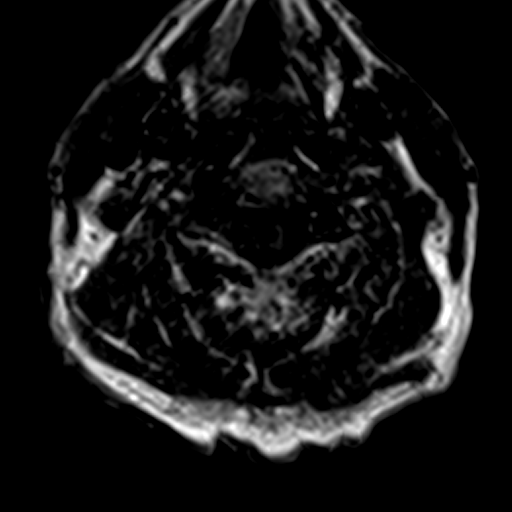
[im 23/32]
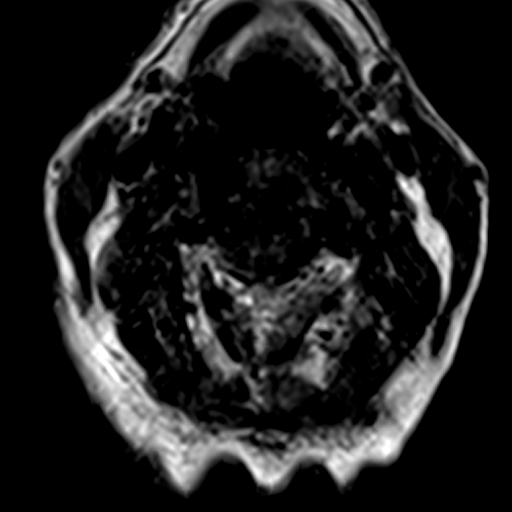
[im 27/32]
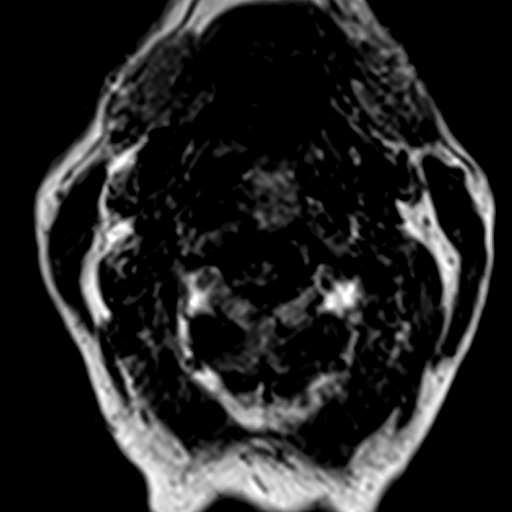
[im 32/32]
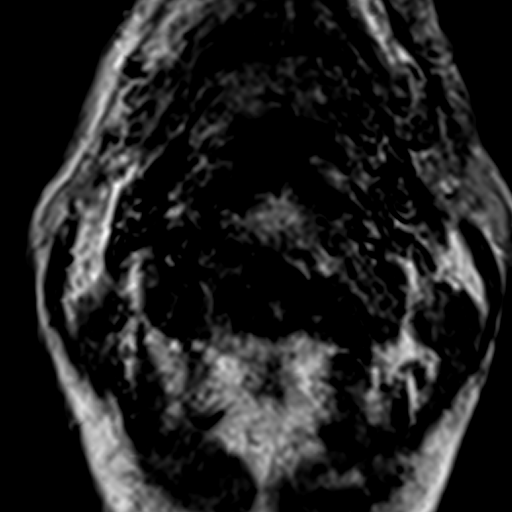

[Series 10: T2 · sagittal · 3.0mm · 0.30mm/px · 4 of 15 slices shown (2 of 2)]
[im 1/15]
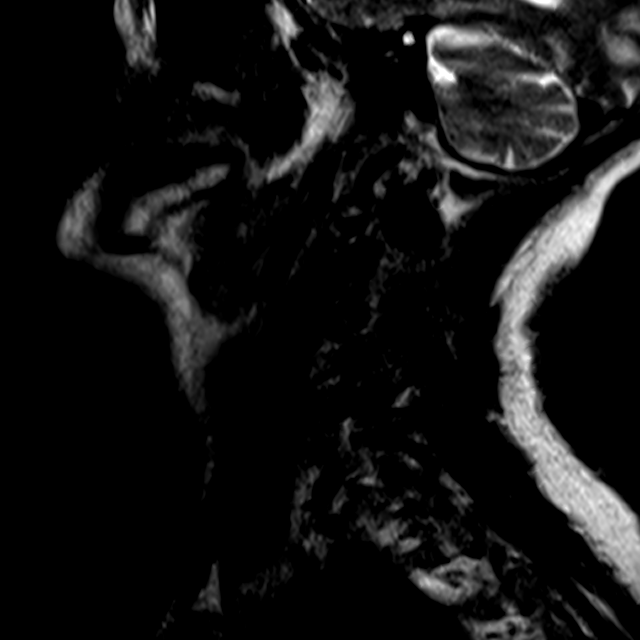
[im 5/15]
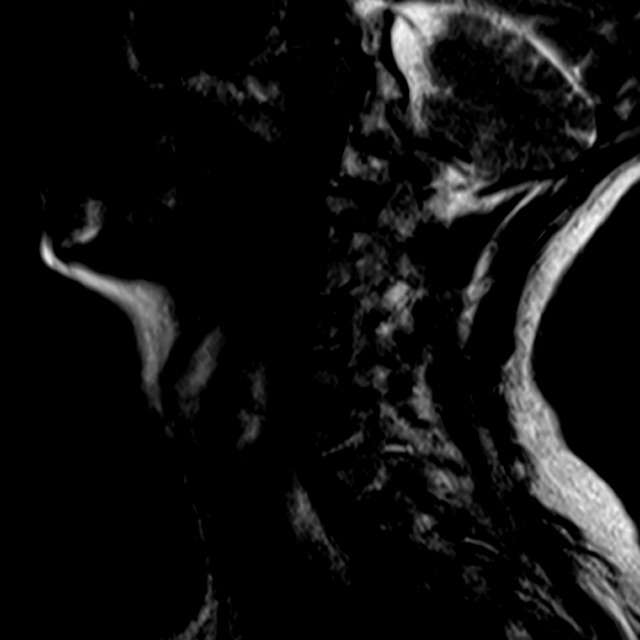
[im 10/15]
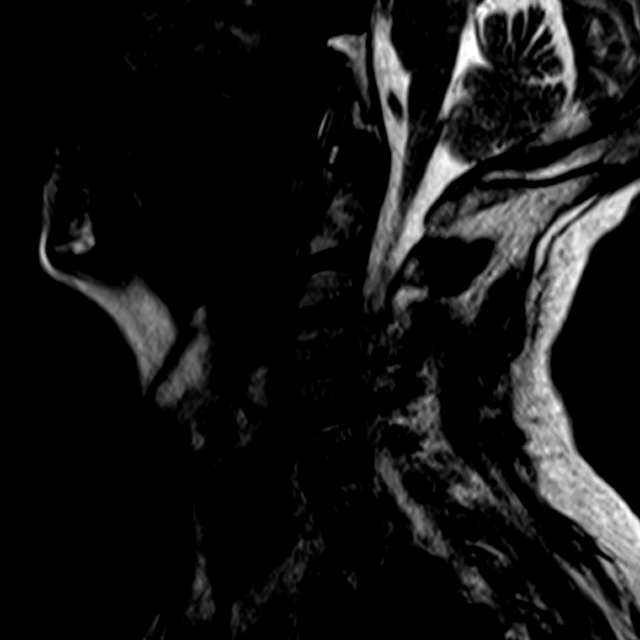
[im 15/15]
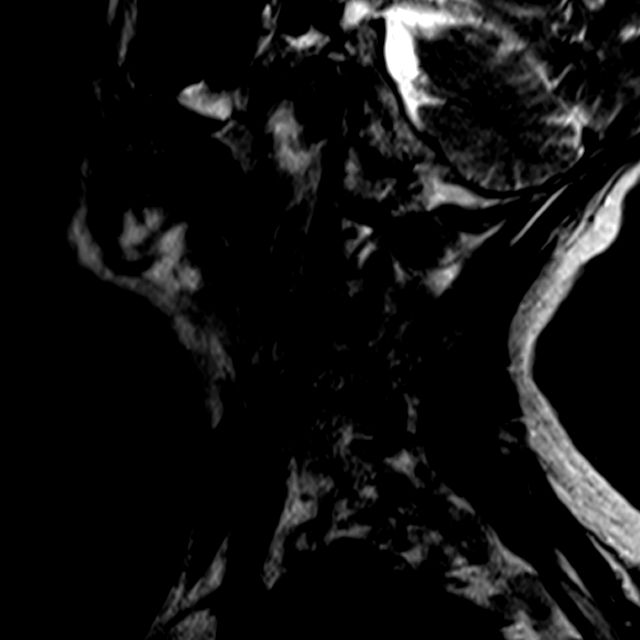

[Series 12: T1 · axial · 3.0mm · 0.25mm/px · z∈[-273,-203]mm · 3 of 32 slices shown (2 of 2)]
[im 5/32]
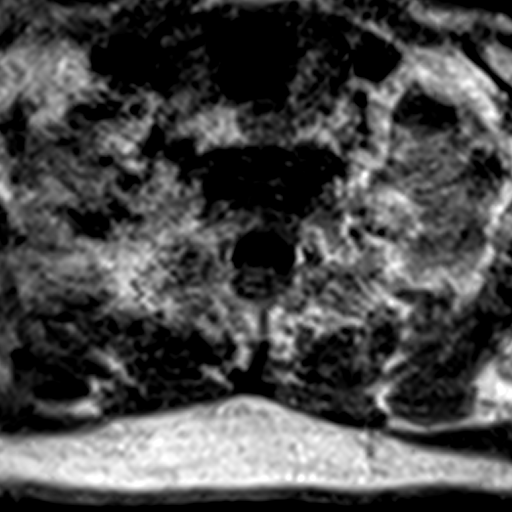
[im 18/32]
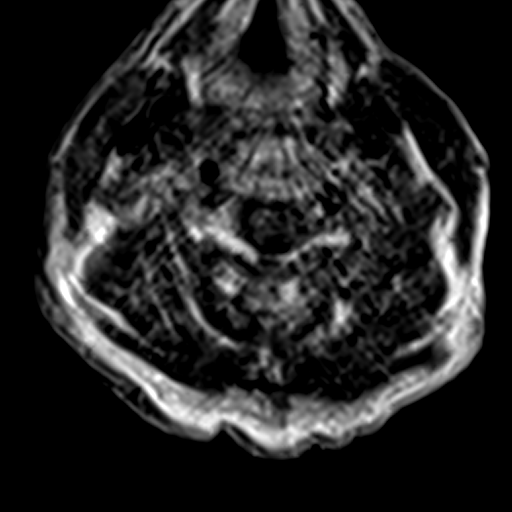
[im 27/32]
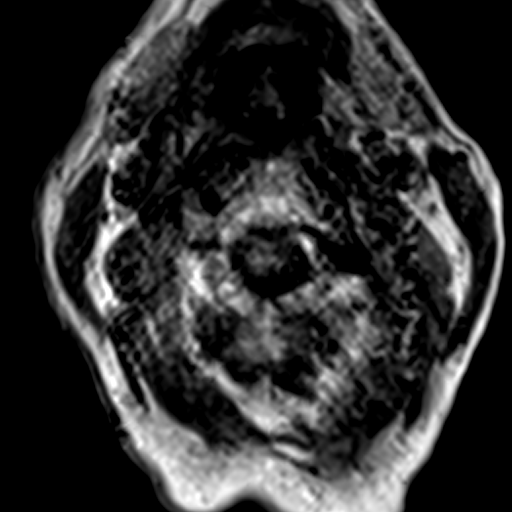

[23 of 48 positions shown; findings below may reference images not displayed]

FINDINGS: Severely motion degraded study - all axial sequences are
nondiagnostic.

Heavily relying on sagittal T1 pre/postcontrast and sagittal STIR
imaging there is no evidence of metastatic disease, discitis, or
cord lesion. Disc degeneration with notable central protrusion at
C3-4 that combines with dorsal ligamentous buckling to cause mild
ventral cord mass effect. There is ligamentum flavum thickening and
buckling at C5-6 with moderate canal stenosis and circumferential
CSF effacement.
IMPRESSION: 1. Motion degraded study, incomplete due to nondiagnostic axial
imaging.
2. No evidence of metastatic disease or cord lesion.
3. Moderate degenerative canal stenosis at C3-4 and C5-6.

## 2016-05-31 IMAGING — MR MR HEAD WO/W CM
7 of 12 series · 27 of 48 positions shown · IV contrast (multihance)
Comparison: Head CT with and without contrast 10/21/2014

CLINICAL DATA: Left-sided weakness with confusion. History of colon
cancer.

EXAM:
MRI HEAD WITHOUT AND WITH CONTRAST
TECHNIQUE: Multiplanar, multiecho pulse sequences of the brain and surrounding
structures were obtained without and with intravenous contrast.
CONTRAST:  9mL MULTIHANCE GADOBENATE DIMEGLUMINE 529 MG/ML IV SOLN

[Series 2: [id number] · sagittal · 5.0mm · 0.41mm/px · 1 of 20 slices shown]
[im 1/20]
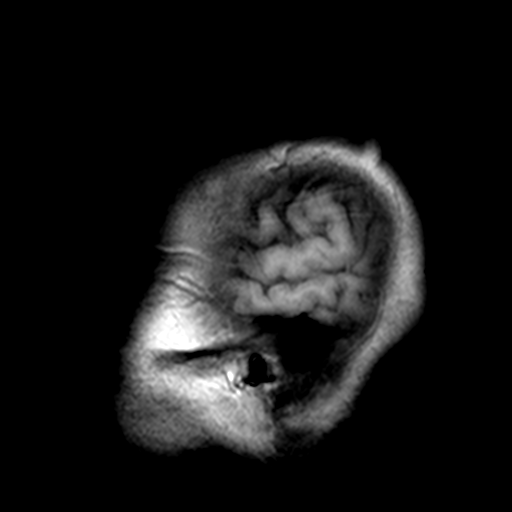

[Series 6: T2 · axial · 5.0mm · 0.75mm/px · z∈[-123,+14]mm · 2 of 23 slices shown (1 of 2)]
[im 1/23]
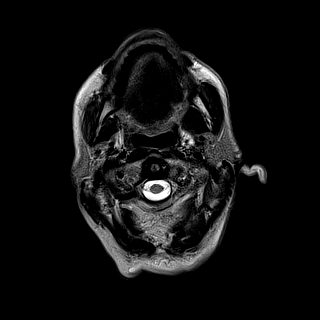
[im 23/23]
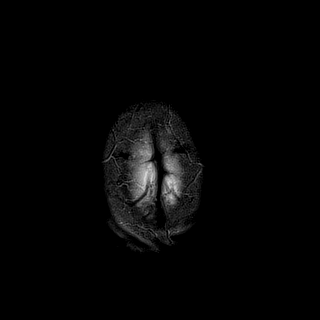

[Series 7: FLAIR · axial · 5.0mm · 0.94mm/px · z∈[-123,+14]mm · 2 of 23 slices shown]
[im 1/23]
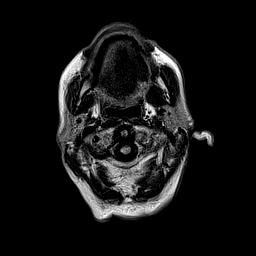
[im 23/23]
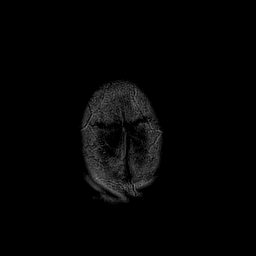

[Series 8: T1 · axial · 2.0mm · 0.47mm/px · z∈[-136,+45]mm · 8 of 95 slices shown]
[im 1/95]
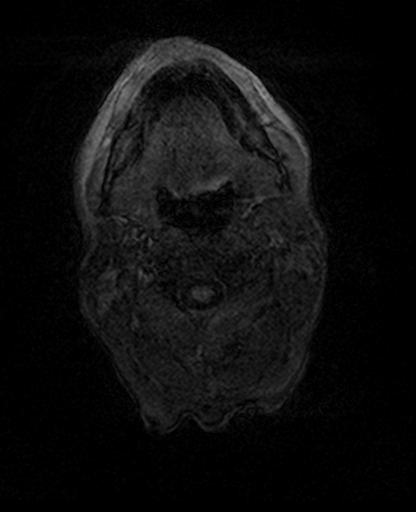
[im 12/95]
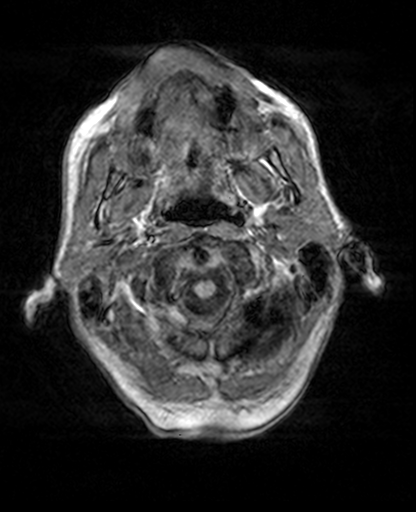
[im 24/95]
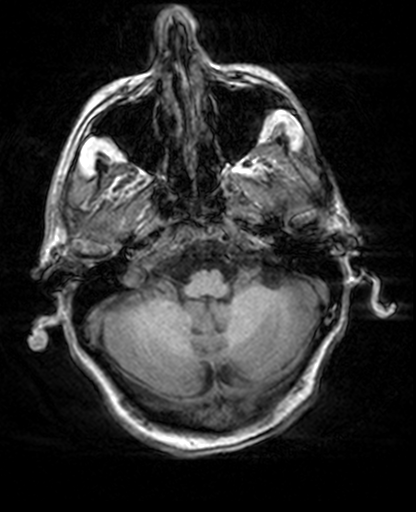
[im 36/95]
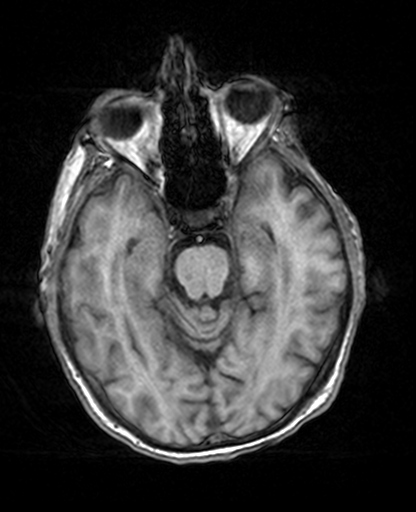
[im 59/95]
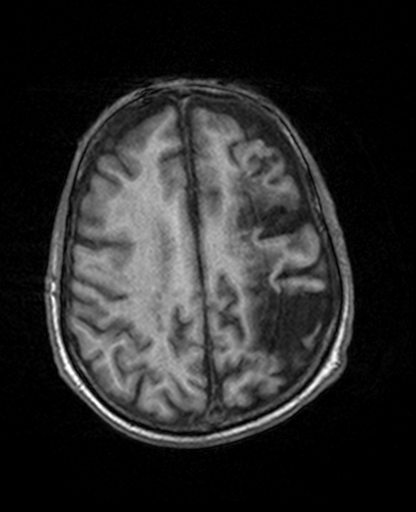
[im 71/95]
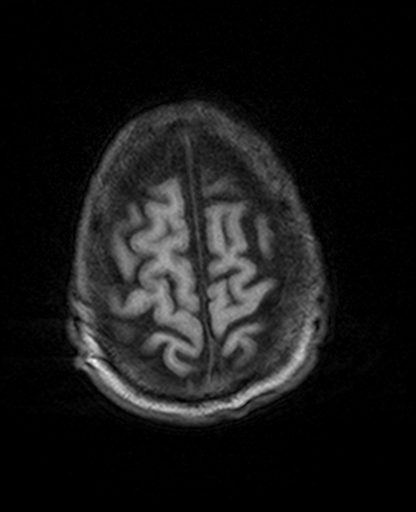
[im 83/95]
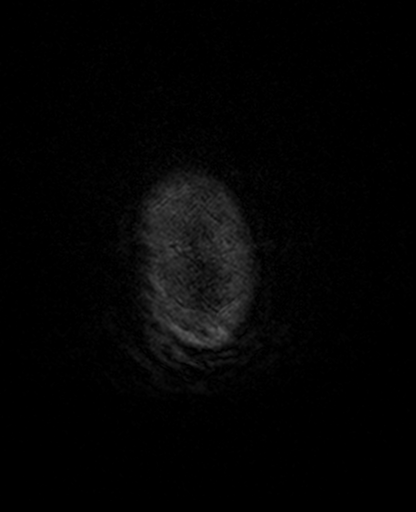
[im 95/95]
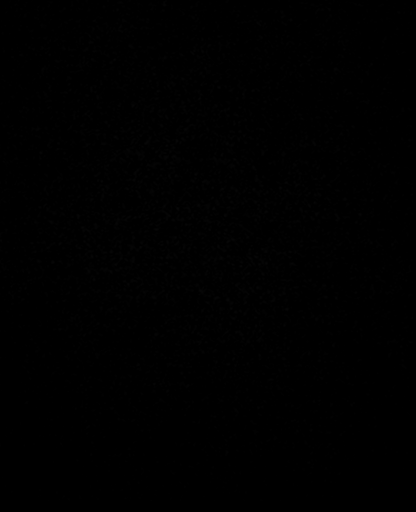

[Series 15: T2 · coronal · 5.0mm · 0.65mm/px · 3 of 28 slices shown (2 of 2)]
[im 1/28]
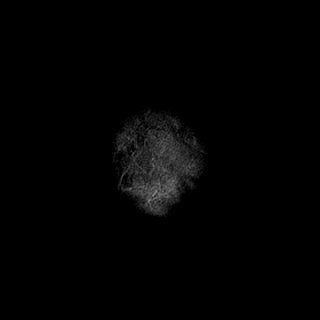
[im 14/28]
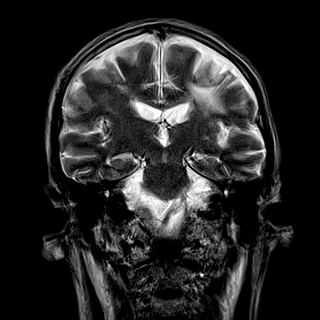
[im 28/28]
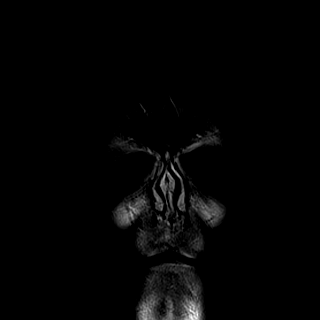

[Series 16: T1 post-contrast · coronal · 5.0mm · 0.46mm/px · 2 of 24 slices shown (1 of 2)]
[im 1/24]
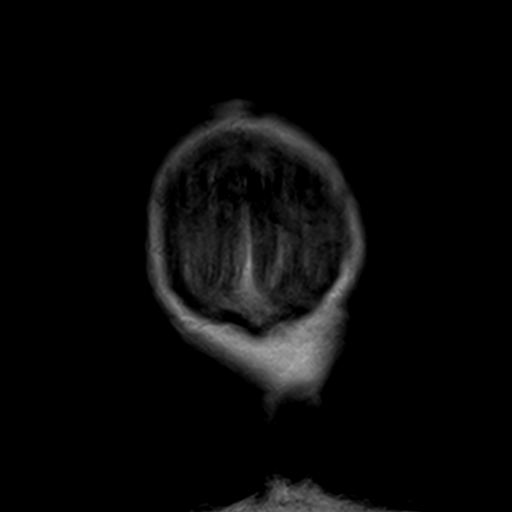
[im 24/24]
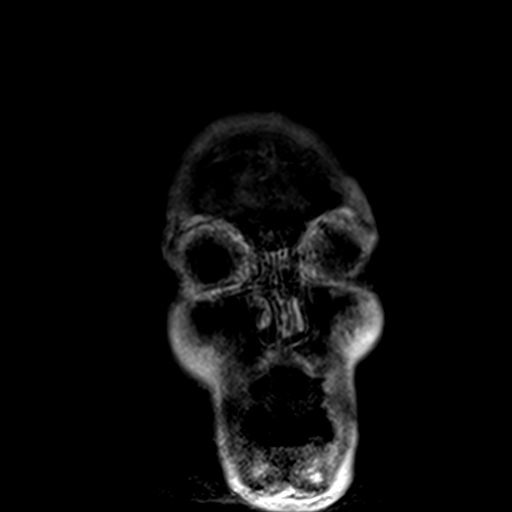

[Series 17: T1 post-contrast · axial · 2.0mm · 0.47mm/px · z∈[-131,+46]mm · 9 of 93 slices shown (2 of 2)]
[im 1/93]
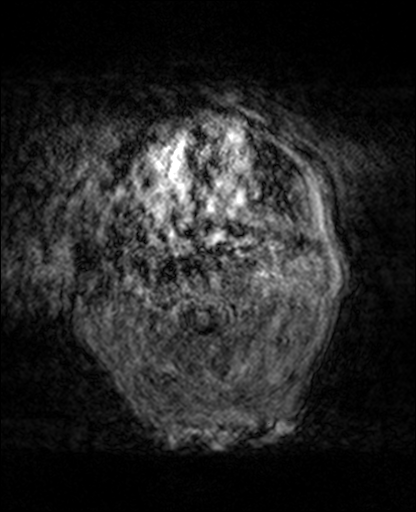
[im 12/93]
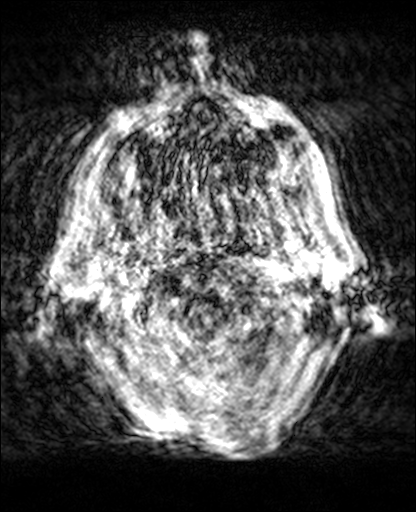
[im 24/93]
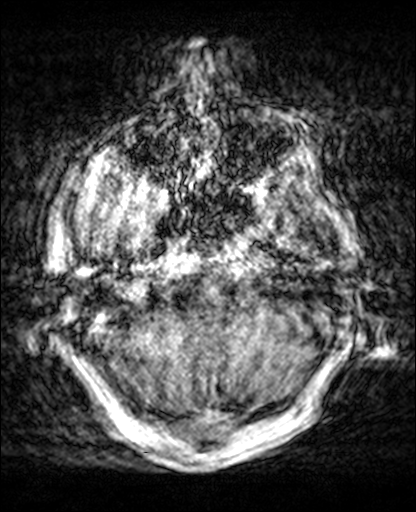
[im 35/93]
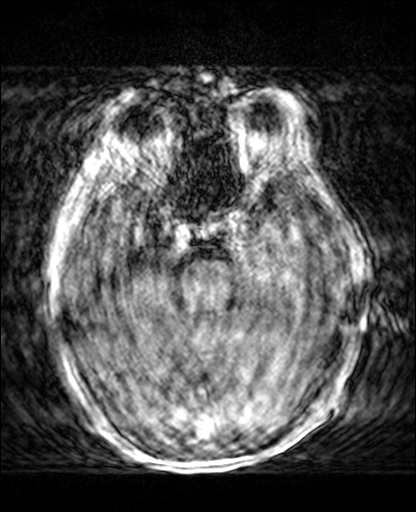
[im 47/93]
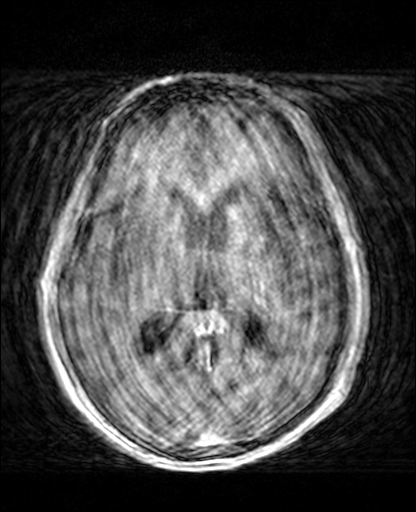
[im 58/93]
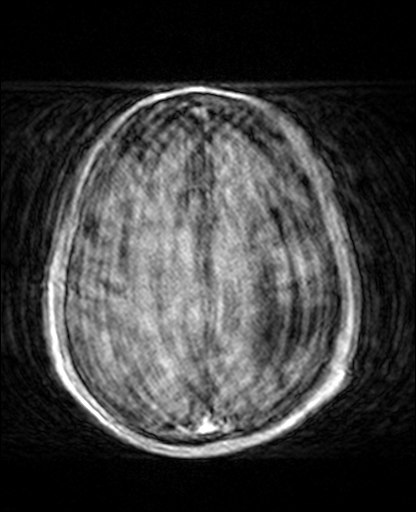
[im 70/93]
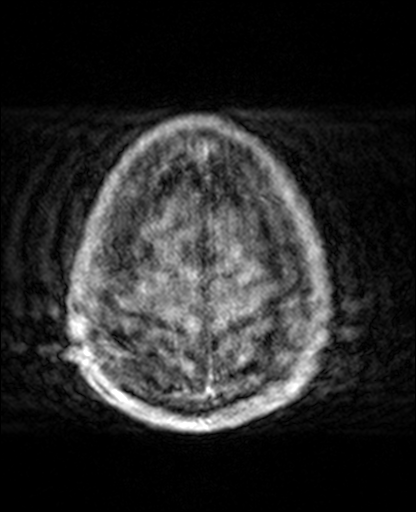
[im 81/93]
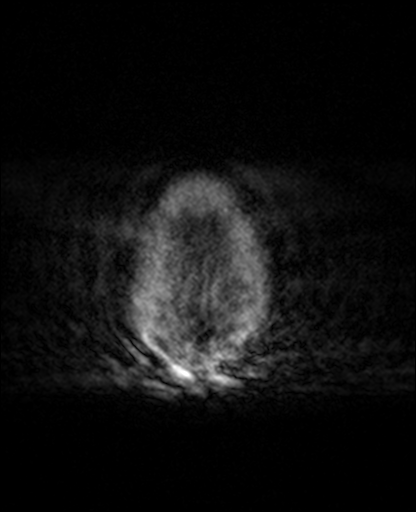
[im 93/93]
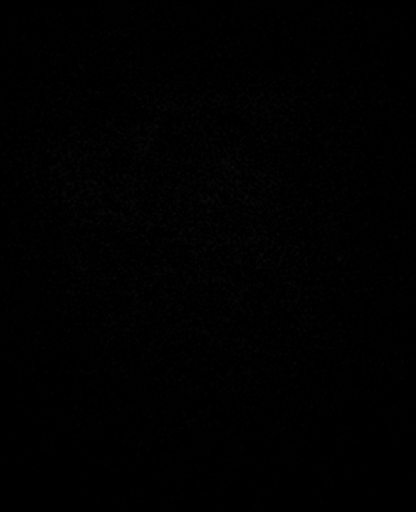

[27 of 48 positions shown; findings below may reference images not displayed]

FINDINGS: Calvarium and upper cervical spine: Soft tissue loss and scarring in
the left temporal fossa. No underlying marrow signal abnormality.

Orbits: No significant findings.

Sinuses and Mastoids: Clear.

Brain: There is multi focal cortical and subcortical restricted
diffusion in the posterior right cerebral hemisphere, roughly
aligned along the posterior border zone/watershed territory. No
hemorrhagic conversion or shift.

There is a remote infarct of the left posterior cerebral hemisphere
with similar pattern. Chronic small-vessel disease, asymmetric to
the left cerebral hemisphere, correlating with chronic occlusion of
the left ICA. No acute major vessel occlusion is identified.

Motion graded postcontrast imaging, nondiagnostic in this patient
with history of malignancy.

Patient is currently admitted.
IMPRESSION: 1. Acute nonhemorrhagic infarcts in the posterior right cerebral
hemisphere, posterior watershed distribution.
2. Chronic left ICA occlusion with remote left MCA and border zone
territory infarcts.
3. Nondiagnostic postcontrast imaging due to motion.

## 2016-07-15 IMAGING — US US CAROTID DUPLEX BILAT
1 series · 13 of 24 positions shown · non-contrast
Comparison: CT 11/27/2014, ultrasound 12/ 12 1441 by report only

CLINICAL DATA: Stroke, hypertension, syncope, previous tobacco
abuse

EXAM:
BILATERAL CAROTID DUPLEX ULTRASOUND
TECHNIQUE: Gray scale imaging, color Doppler and duplex ultrasound was
performed of bilateral carotid and vertebral arteries in the neck.

[Series 1: us carotid duplex bilat · 0.06mm/px · 13 of 52 slices shown]
[im 1/52]
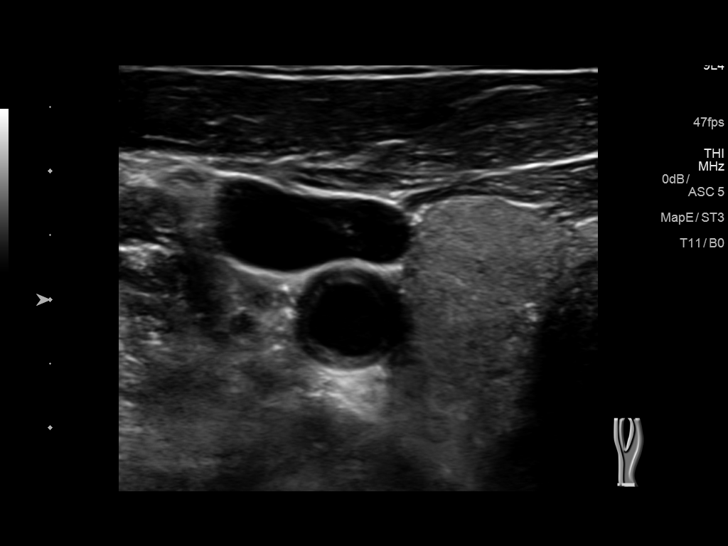
[im 5/52]
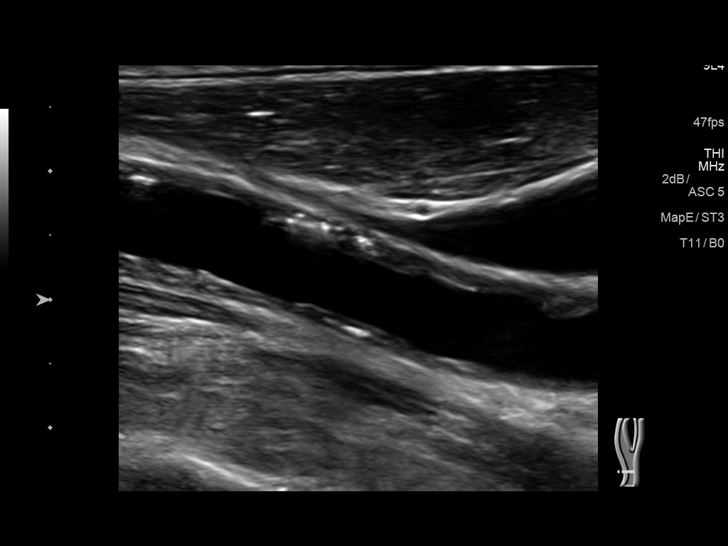
[im 9/52]
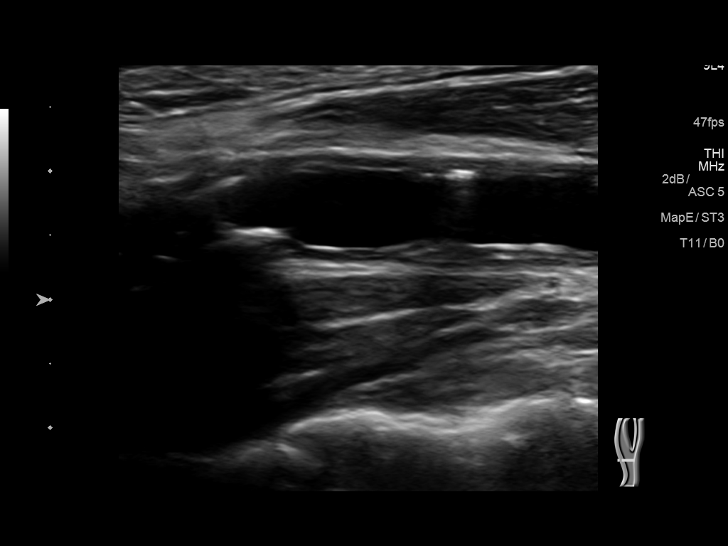
[im 14/52]
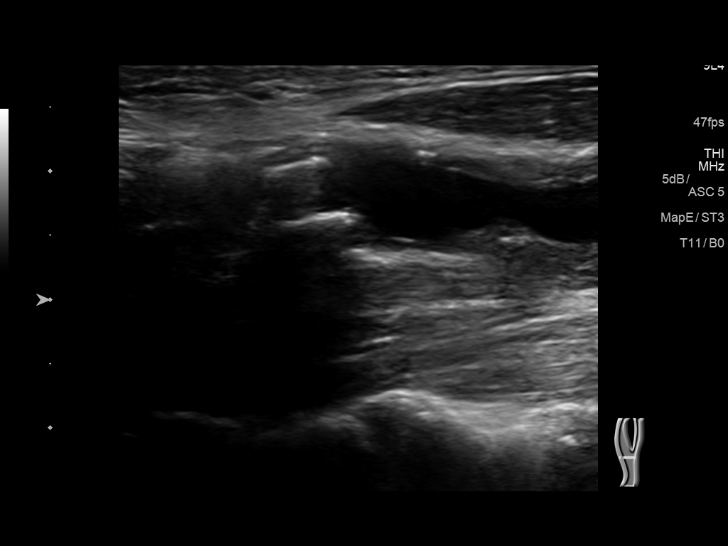
[im 18/52]
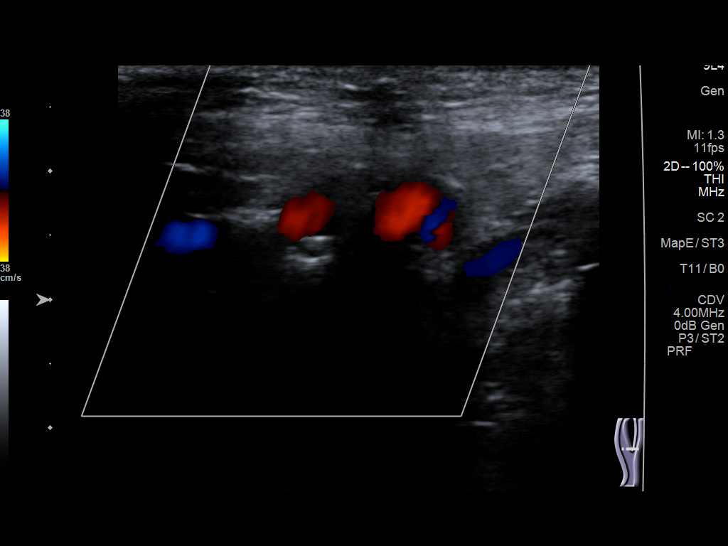
[im 23/52]
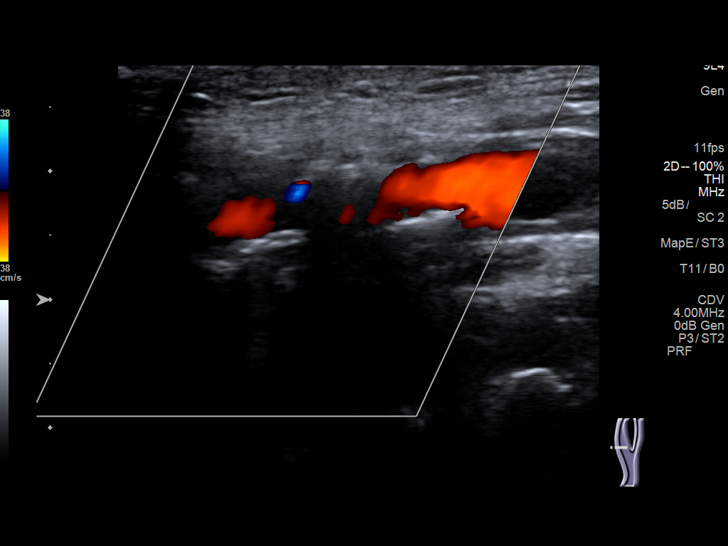
[im 27/52]
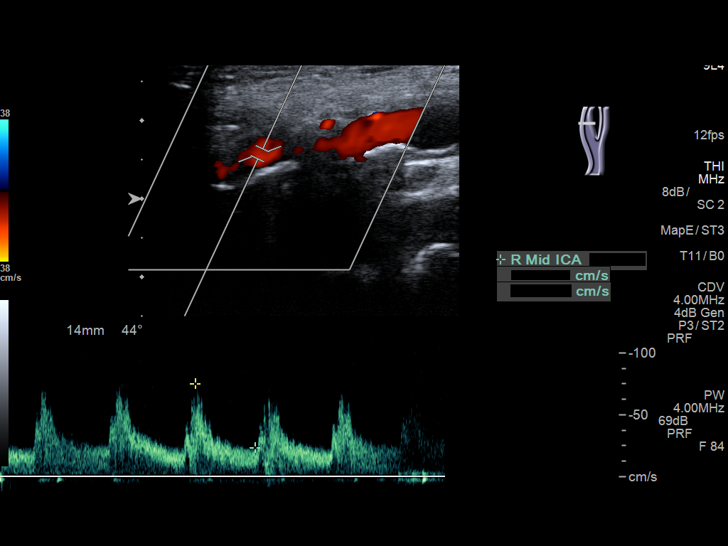
[im 29/52]
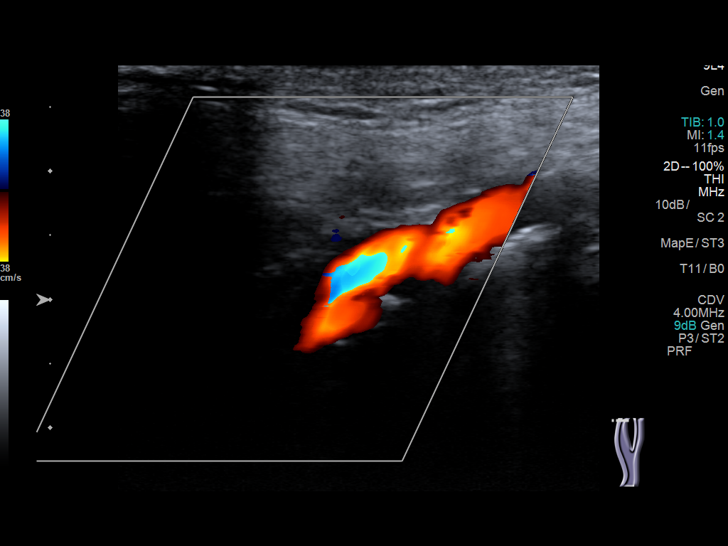
[im 34/52]
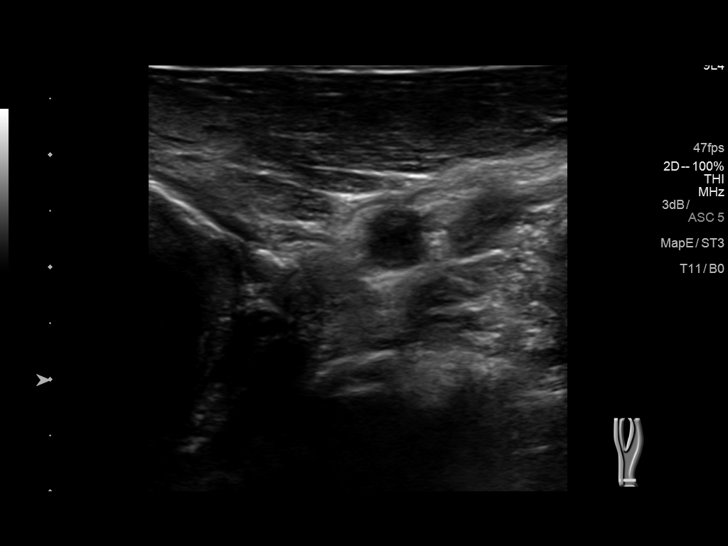
[im 38/52]
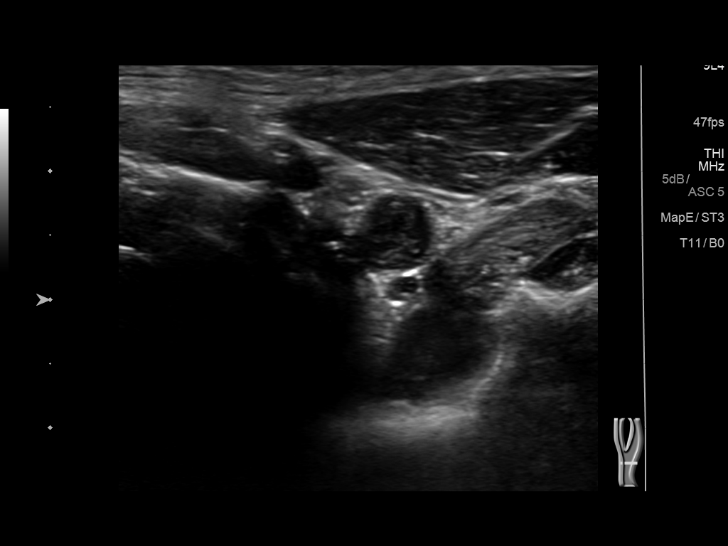
[im 43/52]
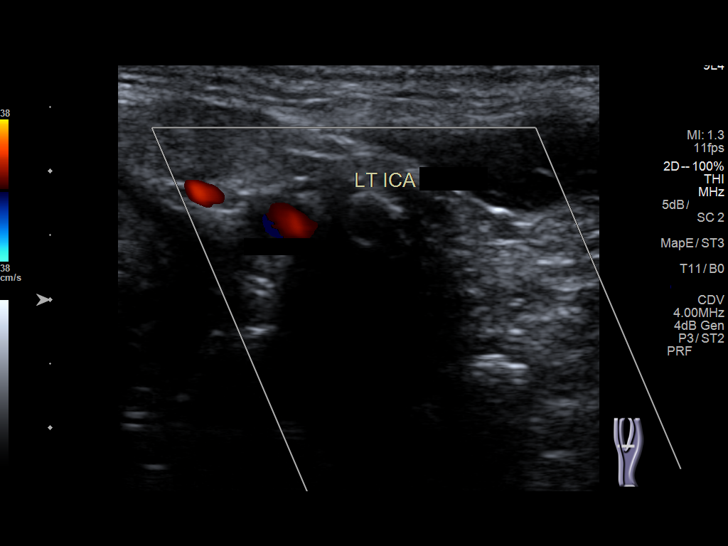
[im 47/52]
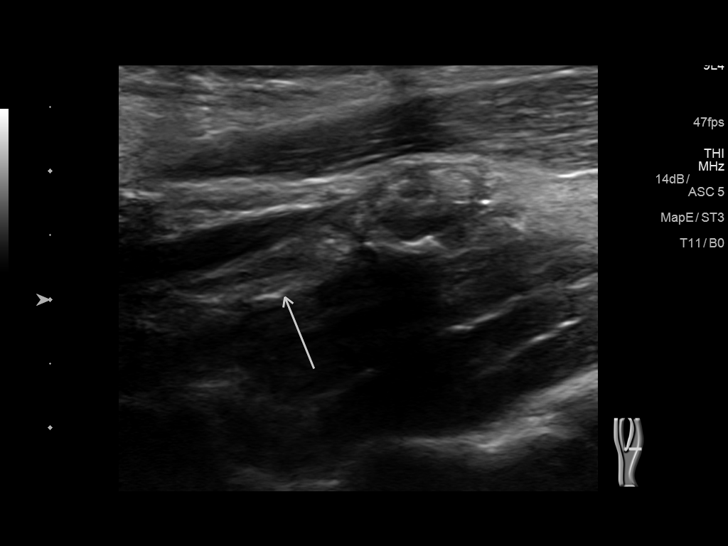
[im 52/52]
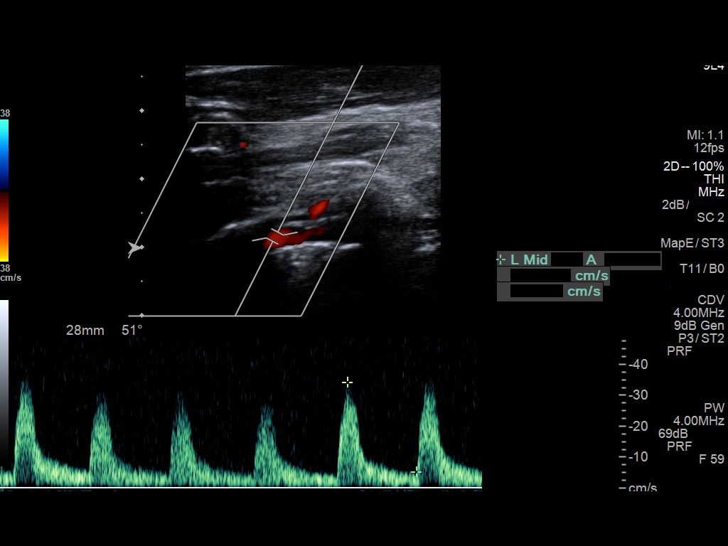

[13 of 24 positions shown; findings below may reference images not displayed]

REVIEW OF SYSTEMS:
Quantification of carotid stenosis is based on velocity parameters
that correlate the residual internal carotid diameter with
NASCET-based stenosis levels, using the diameter of the distal
internal carotid lumen as the denominator for stenosis measurement.

The following velocity measurements were obtained:

PEAK SYSTOLIC/END DIASTOLIC

RIGHT

ICA:                     85/24cm/sec

CCA:                     158/26Cm/sec

SYSTOLIC ICA/CCA RATIO:

DIASTOLIC ICA/CCA RATIO:

ECA:                     175cm/sec

LEFT

ICA:                     Occluded

CCA:                     Occluded

SYSTOLIC ICA/CCA RATIO:  N/a

DIASTOLIC ICA/CCA RATIO: N/a

ECA:                     Occluded
FINDINGS: RIGHT CAROTID ARTERY: Circumferential plaque through the common
carotid artery, with focal areas of calcification, no high-grade
stenosis. There is circumferential eccentric plaque effacing the
carotid bulb and extending into the proximal ICA resulting in at
least mild stenosis. Normal waveforms and color Doppler signal.

RIGHT VERTEBRAL ARTERY:  Normal flow direction and waveform.

LEFT CAROTID ARTERY: Long segment occlusion of the common carotid,
bulb, and ICA. This was partially visible on prior chest CT of
11/26/2014. There is collateral reconstitution of the external
carotid artery with low resistance waveform.

LEFT VERTEBRAL ARTERY: Normal flow direction and waveform.
IMPRESSION: 1. Long segment occlusion of the left common and internal carotid
arteries.
2. Diffuse plaque in the right carotid system resulting in less than
50% diameter stenosis. The exam does not exclude plaque ulceration
or embolization. Continued surveillance recommended.
3. Bilateral antegrade vertebral arterial flow.
